# Patient Record
Sex: Male | Born: 1955 | Race: White | Hispanic: No | Marital: Married | Smoking: Former smoker
Health system: Southern US, Community
[De-identification: ages and names within clinical notes are randomized; demographics above are authoritative.]

## PROBLEM LIST (undated history)

## (undated) DIAGNOSIS — N200 Calculus of kidney: Secondary | ICD-10-CM

## (undated) DIAGNOSIS — K219 Gastro-esophageal reflux disease without esophagitis: Secondary | ICD-10-CM

## (undated) DIAGNOSIS — H409 Unspecified glaucoma: Secondary | ICD-10-CM

## (undated) DIAGNOSIS — F32A Depression, unspecified: Secondary | ICD-10-CM

## (undated) DIAGNOSIS — R3915 Urgency of urination: Secondary | ICD-10-CM

## (undated) DIAGNOSIS — N201 Calculus of ureter: Secondary | ICD-10-CM

## (undated) DIAGNOSIS — R300 Dysuria: Secondary | ICD-10-CM

## (undated) DIAGNOSIS — M1990 Unspecified osteoarthritis, unspecified site: Secondary | ICD-10-CM

## (undated) DIAGNOSIS — Z87442 Personal history of urinary calculi: Secondary | ICD-10-CM

## (undated) DIAGNOSIS — Z973 Presence of spectacles and contact lenses: Secondary | ICD-10-CM

## (undated) DIAGNOSIS — I10 Essential (primary) hypertension: Secondary | ICD-10-CM

## (undated) DIAGNOSIS — F329 Major depressive disorder, single episode, unspecified: Secondary | ICD-10-CM

## (undated) HISTORY — DX: Gastro-esophageal reflux disease without esophagitis: K21.9

## (undated) HISTORY — DX: Unspecified glaucoma: H40.9

## (undated) HISTORY — PX: KNEE SURGERY: SHX244

## (undated) HISTORY — PX: NOSE SURGERY: SHX723

## (undated) HISTORY — DX: Essential (primary) hypertension: I10

## (undated) HISTORY — PX: ANKLE FRACTURE SURGERY: SHX122

---

## 1999-12-20 ENCOUNTER — Encounter: Admission: RE | Admit: 1999-12-20 | Discharge: 1999-12-20 | Payer: Self-pay | Admitting: General Surgery

## 1999-12-20 ENCOUNTER — Encounter (HOSPITAL_BASED_OUTPATIENT_CLINIC_OR_DEPARTMENT_OTHER): Payer: Self-pay | Admitting: General Surgery

## 1999-12-21 ENCOUNTER — Ambulatory Visit (HOSPITAL_BASED_OUTPATIENT_CLINIC_OR_DEPARTMENT_OTHER): Admission: RE | Admit: 1999-12-21 | Discharge: 1999-12-21 | Payer: Self-pay | Admitting: General Surgery

## 1999-12-21 ENCOUNTER — Encounter (INDEPENDENT_AMBULATORY_CARE_PROVIDER_SITE_OTHER): Payer: Self-pay

## 1999-12-21 HISTORY — PX: INGUINAL HERNIA REPAIR: SUR1180

## 2008-05-08 ENCOUNTER — Inpatient Hospital Stay (HOSPITAL_COMMUNITY): Admission: EM | Admit: 2008-05-08 | Discharge: 2008-05-11 | Payer: Self-pay | Admitting: Emergency Medicine

## 2008-05-09 HISTORY — PX: IM NAILING TIBIA: SUR734

## 2009-07-11 ENCOUNTER — Ambulatory Visit (HOSPITAL_BASED_OUTPATIENT_CLINIC_OR_DEPARTMENT_OTHER): Admission: RE | Admit: 2009-07-11 | Discharge: 2009-07-11 | Payer: Self-pay | Admitting: Orthopaedic Surgery

## 2009-07-11 HISTORY — PX: HARDWARE REMOVAL: SHX979

## 2010-06-28 LAB — POCT I-STAT, CHEM 8
BUN: 15 mg/dL (ref 6–23)
Calcium, Ion: 1.06 mmol/L — ABNORMAL LOW (ref 1.12–1.32)
Chloride: 110 mEq/L (ref 96–112)
Creatinine, Ser: 0.8 mg/dL (ref 0.4–1.5)
Glucose, Bld: 104 mg/dL — ABNORMAL HIGH (ref 70–99)
HCT: 50 % (ref 39.0–52.0)
Hemoglobin: 17 g/dL (ref 13.0–17.0)
Potassium: 4 mEq/L (ref 3.5–5.1)
Sodium: 139 mEq/L (ref 135–145)
TCO2: 21 mmol/L (ref 0–100)

## 2010-07-24 LAB — DIFFERENTIAL
Basophils Relative: 1 % (ref 0–1)
Eosinophils Absolute: 0.1 10*3/uL (ref 0.0–0.7)
Lymphs Abs: 2.1 10*3/uL (ref 0.7–4.0)
Neutro Abs: 12.1 10*3/uL — ABNORMAL HIGH (ref 1.7–7.7)
Neutrophils Relative %: 79 % — ABNORMAL HIGH (ref 43–77)

## 2010-07-24 LAB — CBC
HCT: 43.9 % (ref 39.0–52.0)
Hemoglobin: 15.2 g/dL (ref 13.0–17.0)
MCHC: 34.6 g/dL (ref 30.0–36.0)
MCV: 90.4 fL (ref 78.0–100.0)
RBC: 4.86 MIL/uL (ref 4.22–5.81)

## 2010-07-24 LAB — PROTIME-INR
INR: 1 (ref 0.00–1.49)
Prothrombin Time: 13.1 seconds (ref 11.6–15.2)

## 2010-07-24 LAB — URINALYSIS, ROUTINE W REFLEX MICROSCOPIC
Bilirubin Urine: NEGATIVE
Hgb urine dipstick: NEGATIVE
Protein, ur: NEGATIVE mg/dL
Urobilinogen, UA: 0.2 mg/dL (ref 0.0–1.0)

## 2010-07-24 LAB — COMPREHENSIVE METABOLIC PANEL
ALT: 50 U/L (ref 0–53)
Alkaline Phosphatase: 58 U/L (ref 39–117)
BUN: 16 mg/dL (ref 6–23)
CO2: 19 mEq/L (ref 19–32)
Calcium: 8.9 mg/dL (ref 8.4–10.5)
GFR calc non Af Amer: >60 mL/min (ref >60)
Glucose, Bld: 134 mg/dL — ABNORMAL HIGH (ref 70–99)
Sodium: 136 mEq/L (ref 135–145)

## 2010-08-22 NOTE — Op Note (Signed)
[name], [name]              ACCOUNT NO.:  [id number]   MEDICAL RECORD NO.:  [id number]          PATIENT TYPE:  INP   LOCATION:  1612                         FACILITY:  [facility]   PHYSICIAN:  Mark C. Ophelia Charter, M.D.    DATE OF BIRTH:  [birth year]   DATE OF PROCEDURE:  05/09/2008  DATE OF DISCHARGE:                               OPERATIVE REPORT   PREOPERATIVE DIAGNOSIS:  Comminuted segmental left tibial shaft fracture  with fibular fracture.   POSTOPERATIVE DIAGNOSIS:  Comminuted segmental left tibial shaft  fracture with fibular fracture.   PROCEDURE:  Left tibial nail with proximal and distal interlock.  Synthes 285-mm nail x 10-mm diameter.   SURGEON:  Mark C. Ophelia Charter, M.D.   ANESTHESIA:  GOT.   TOURNIQUET TIME:  44 minutes.   DRAINS:  None.   This 55 year old male was going outside, slipped on the porch as he was  going to the steps and suffered a comminuted mid distal shaft tibia  fracture with segmental fibular fracture.   A proximal thigh tourniquet was applied, leg was prepared with the  clippers, a 10/15 drape was applied and the foot was prepped with  DuraPrep and after I had gowned and gloved, after appropriate hand  sterilization via standard technique, I held the foot with an impervious  stockinette above the ankle with distraction and the rest of the leg was  prepped from the midcalf up to the level of the tourniquet.  A sterile  blue U-drape was applied, Coban was applied to the foot, leaving the  ankle exposed and extremity sheets and drapes were applied.  A small  triangle was used with careful padding and a surgical time-out checklist  was completed, Ancef had been given, leg was wrapped in Esmarch,  tourniquet inflated.  Tourniquet was inflated to 350, it was deflated at  the end of the case prior to closure.  Incision was made parallel to the  medial aspect of the patellar tendon.  Blunt dissection after incising  superficial retinaculum was performed down  to the bone and a drill was  used, checked under fluoroscopy AP and lateral, then overdrilled with  the reamer to make a cortical window which was extra-articular.  Beaded  tip rod was slightly bent, passed across the fracture site by hand with  traction applied to the leg lining up the segmental oblique segment and  the beaded rod was extended down the old growth plate, it was measured  at 285.  Sequential reaming was performed up to an 11 for a 10-mm  selected nail times 285 Synthes.  Once it was countersunk, the lines  were checked proximally, checked under lateral to make sure it was in  good position, interlocks were placed initial and went from anteromedial  to posterolateral and it was posterior to the fibular head and exactly  through the cortex but not protruding into the soft tissue more than 1-  mm.  Another interlock was placed from medial to lateral, both static,  locked down, checked under fluoroscopy.  The proximal guide device was  removed.  Distal interlocks were placed  by freehand technique, one  medial to lateral, the other anterior to posterior, but I did not use  the distal most hole, which biomechanically is not as strong.  Rotation  was lined up, fracture was checked, it was an excellent position, it was  compressed and screws were tightened down, locked down securely and  freehand technique was used for the fluoroscopy.  Eight-tenths of a  minute was total fluoroscopy spot times in total.  All areas were  irrigated, incisions where the screws were placed, which had been made  with a stab incision, blunt spreading with the mosquito and then placing  the drill with drill guide directly down bone, to make sure that there  was no soft tissue damage these incisions were closed with a skin  stapler.  Superficial retinaculum in the knee, superficial subcutaneous  tissues were closed with 0 Vicryl in the deepest layer, 2-0 Vicryl in  the subcutaneous tissue and then skin  staple closure, postoperative  dressing, short-leg splint after Adaptic, 4 x 4's.  Marcaine was  infiltrated in all areas and the tourniquet was deflated prior to  closure.  There was minimal bleeding and spot pictures were taken  confirming appropriate position and alignment and good position of the  fractures.  It was elected not to plate the fibula since the tibia lined  up well, had good rotation and there was good canal fit since a 10-mm  rod had been able to be placed.  Fibula was out to length.  Surgical  checklist was completed prior to closure at the end of case, Class I  wound.  Instrument count, needle count, sponge count was correct.      Mark C. Ophelia Charter, M.D.  Electronically Signed     MCY/MEDQ  D:  05/09/2008  T:  05/09/2008  Job:  [id number]

## 2010-08-25 NOTE — Op Note (Signed)
[place]  Patient:    [name], [name]                     MRN: [id number] Proc. Date: 12/21/99 Adm. Date:  [id number] Attending:  Fortino Sic CC:         Teena Irani. Arlyce Dice, M.D.   Operative Report  PREOPERATIVE DIAGNOSIS:  Left inguinal hernia.  POSTOPERATIVE DIAGNOSIS:  Left inguinal hernia.  OPERATION PERFORMED:  Repair of left inguinal hernia.  SURGEON:  Marnee Spring. Wiliam Ke, M.D.  ASSISTANT:  None.  ANESTHESIA:  Local MAC.  PROCEDURE:  With the patient well-sedated, the skin of the groin was prepped and draped in usual manner.  Tissues were infiltrated with a mixture of Xylocaine and Marcaine anesthesia.  A curvilinear incision was made in a lower abdominal skin fold and deepened through subcutaneous tissues.  The external oblique aponeurosis was opened, dissecting the inguinal canal.  The ilioinguinal nerve was identified and spared.  Spermatic cord was held aside with a Penrose drain.  The patient had a large direct hernia and a large lipoma of the cord.  The lipoma of the cord was freed from the anteromedial aspect of the cord.  It was excised after being tied off with 2-0 silk.  There was no indirect sac.   The internal ring was made smaller and the hernia was repaired with a Marlex patch.  This was fashioned and then sewn in place with two running 2-0 Novofil sutures.  Both starting at pubic tubercle, one running up conjoin tendon and one running up shelving portion of Pouparts ligament. Both were tied at the internal ring and then a collaring suture of 2-0 Novofil was used.  Hemostasis was excellent.  The external oblique aponeurosis was closed superficial to the cord with a running 2-0 Vicryl, 3-0 Vicryl was used in Scarpas fascia.  The skin was closed with subcuticular 4-0 Dexon and Steri-Strips were applied.  Estimated blood loss minimal.  The patient received no blood and left the operating room in satisfactory condition  after sponge and needle counts were verified and the testicles found to be well in the scrotum. DD:  12/21/99 TD:  12/22/99 Job: [id number]

## 2010-08-25 NOTE — Discharge Summary (Signed)
[name], [name]              ACCOUNT NO.:  [id number]   MEDICAL RECORD NO.:  [id number]          PATIENT TYPE:  INP   LOCATION:  1612                         FACILITY:  [facility]   PHYSICIAN:  Mark C. Ophelia Charter, M.D.    DATE OF BIRTH:  [birth year]   DATE OF ADMISSION:  05/08/2008  DATE OF DISCHARGE:  05/11/2008                               DISCHARGE SUMMARY   ADMISSION DIAGNOSES:  1. Left comminuted distal tibia and fibula fracture.  2. Hypertension  3. History of kidney stones.   DISCHARGE DIAGNOSES:  1. Left comminuted distal tibia and fibula fracture.  2. Hypertension  3. History of kidney stones.  4. Acute blood loss anemia, mild.   PROCEDURE:  On May 09, 2008, the patient underwent intermedullary  nailing of left closed comminuted tibia fracture.  This was performed by  Dr. Ophelia Charter under general anesthesia.   CONSULTATIONS:  None.   BRIEF HISTORY:  The patient is a 55 year old male who tripped and fell  at his home.  He had immediate onset of pain in the left lower extremity  and inability to bear weight.  He was brought to the emergency room for  evaluation.  Radiographs showed a closed comminuted fracture of the left  tibia and fibula.  The patient was admitted for surgical intervention.   BRIEF HOSPITAL COURSE:  The patient tolerated the procedure under  general anesthesia without complications.  Postoperatively,  neurovascular motor function was noted to be intact.  The patient was  monitored closely for compartment syndrome and did have some bloody  drainage from the mid posterior calf with dressing changes made as  needed.  The patient was not placed on Lovenox or Coumadin for DVT  prophylaxis as the patient was at high risk for compartment syndrome  secondary to bleeding in the calf.  DVT prophylaxis included SCDs,  aspirin b.i.d. and early ambulation.  The patient utilized PCA  analgesics initially and was weaned to p.o. analgesics without  difficulty.  He was  able to utilize Tylox with good pain control.  The  patient was seen by the physical therapist for ambulation and gait  training, and was taught strict nonweightbearing to the operative  extremity utilizing a walker.  He was able to ambulate 50 feet during  the hospital stay and did demonstrate ability to maintain the  nonweightbearing status.  On May 11, 2008, he was stable for  discharge to his home.   LABORATORY DATA:  Pertinent laboratory values on admission:  CBC with  hemoglobin 15.2, hematocrit 43.9 and WBC 15.3.  At discharge, hemoglobin  and hematocrit 12.5 and 36.3.  On admission, coagulation studies within  normal limits.  CMET on admission was within normal limits with the  exception of glucose 134.  Urinalysis on admission negative for urinary  tract infection.   DIAGNOSTICS:  1. EKG on admission, normal EKG.  2. Chest x-ray on admission with no acute abnormalities.   PLAN:  The patient was discharged to his home.  Arrangements made for  home health physical therapy evaluation.  He will continue to be strict  nonweightbearing  to the operative extremity and utilize a walker for  ambulation.  He is instructed in elevation of his legs when at rest.  He  will keep his splint and dressing dry and clean.  The patient will  follow up with Dr. Ophelia Charter in 1 week.  He is advised to call the office to  arrange the appointment time.  All questions encouraged and answered.   DISCHARGE MEDICATIONS:  1. Tylox 1-2 every 4-6 hours as needed for pain.  2. Aspirin 325 mg daily.  3. He will continue home medications as taken prior to admission.   CONDITION ON DISCHARGE:  Stable.      Wende Neighbors, P.A.      Mark C. Ophelia Charter, M.D.  Electronically Signed    SMV/MEDQ  D:  06/10/2008  T:  06/10/2008  Job:  [id number]

## 2011-07-13 DIAGNOSIS — I10 Essential (primary) hypertension: Secondary | ICD-10-CM | POA: Insufficient documentation

## 2017-04-23 ENCOUNTER — Ambulatory Visit (INDEPENDENT_AMBULATORY_CARE_PROVIDER_SITE_OTHER): Payer: BLUE CROSS/BLUE SHIELD | Admitting: Orthopaedic Surgery

## 2017-04-23 ENCOUNTER — Encounter (INDEPENDENT_AMBULATORY_CARE_PROVIDER_SITE_OTHER): Payer: Self-pay | Admitting: Orthopaedic Surgery

## 2017-04-23 ENCOUNTER — Ambulatory Visit (INDEPENDENT_AMBULATORY_CARE_PROVIDER_SITE_OTHER): Payer: BLUE CROSS/BLUE SHIELD

## 2017-04-23 VITALS — BP 153/94 | HR 73 | Ht 65.0 in | Wt 215.0 lb

## 2017-04-23 DIAGNOSIS — M1711 Unilateral primary osteoarthritis, right knee: Secondary | ICD-10-CM

## 2017-04-23 DIAGNOSIS — M25561 Pain in right knee: Secondary | ICD-10-CM | POA: Diagnosis not present

## 2017-04-23 DIAGNOSIS — G8929 Other chronic pain: Secondary | ICD-10-CM | POA: Diagnosis not present

## 2017-04-23 NOTE — Progress Notes (Signed)
Office Visit Note   Patient: [name]           Date of Birth: [birth year]           MRN: [id number] Visit Date: 04/23/2017              Requested by: No referring provider defined for this encounter. PCP: Chesley Noon, MD   Assessment & Plan: Visit Diagnoses:  1. Chronic pain of right knee   2. Unilateral primary osteoarthritis, right knee     Plan: Injection performed with improved symptoms.  I plan to recheck him in 2 months.  We reviewed the findings on his x-ray and he understands that at some point he will likely require total knee arthroplasty.  Follow-Up Instructions: Return in about 2 months (around 06/21/2017).   Orders:  Orders Placed This Encounter  Procedures  . Large Joint Inj: R knee  . XR KNEE 3 VIEW RIGHT   No orders of the defined types were placed in this encounter.     Procedures: Large Joint Inj: R knee on 04/23/2017 4:35 PM Indications: pain and joint swelling Details: 22 G 1.5 in needle, anterolateral approach  Arthrogram: No  Medications: 40 mg methylPREDNISolone acetate 40 MG/ML; 0.5 mL lidocaine 1 %; 4 mL bupivacaine 0.25 % Outcome: tolerated well, no immediate complications Procedure, treatment alternatives, risks and benefits explained, specific risks discussed. Consent was given by the patient. Immediately prior to procedure a time out was called to verify the correct patient, procedure, equipment, support staff and site/side marked as required. Patient was prepped and draped in the usual sterile fashion.       Clinical Data: No additional findings.   Subjective: Chief Complaint  Patient presents with  . Right Knee - Pain    HPI  62 year old male seen with chronic right knee pain which is gradually getting worse with difficulty walking.  He has popping giving way has not fallen yet but several times had to grab something to avoid falling.  He has swelling by the end of the day.  Previous knee arthroscopy with probable  medial meniscectomy in 1971 and 72.  Denies hip pain no chills or fever.  Review of Systems positive for hypertension previous knee arthroscopy 1971.  Previous left tibial nail 2010 for tibial fracture.  Otherwise negative as it pertains HPI.   Objective: Vital Signs: BP (!) 153/94   Pulse 73   Ht 5\' 5"  (1.651 m)   Wt 215 lb (97.5 kg)   BMI 35.78 kg/m   Physical Exam  Constitutional: He is oriented to person, place, and time. He appears well-developed and well-nourished.  HENT:  Head: Normocephalic and atraumatic.  Eyes: EOM are normal. Pupils are equal, round, and reactive to light.  Neck: No tracheal deviation present. No thyromegaly present.  Cardiovascular: Normal rate.  Pulmonary/Chest: Effort normal. He has no wheezes.  Abdominal: Soft. Bowel sounds are normal.  Neurological: He is alert and oriented to person, place, and time.  Skin: Skin is warm and dry. Capillary refill takes less than 2 seconds.  Psychiatric: He has a normal mood and affect. His behavior is normal. Judgment and thought content normal.    Ortho Exam patient has some varus deformity of the right knee no pain with hip range of motion no limitation of hip flexion contracture.  3+ knee effusion medial compartment tenderness.  Well-healed scars on the left knee from previous tibial nail.  Is amatory with the right knee limp.  Specialty Comments:  No specialty comments available.  Imaging: No results found.   PMFS History: There are no active problems to display for this patient.  Past Medical History:  Diagnosis Date  . Acid reflux   . Glaucoma   . Hypertension     No family history on file.  Past Surgical History:  Procedure Laterality Date  . ANKLE FRACTURE SURGERY    . KNEE SURGERY     Social History   Occupational History  . Not on file  Tobacco Use  . Smoking status: Never Smoker  . Smokeless tobacco: Never Used  Substance and Sexual Activity  . Alcohol use: No    Frequency: Never    . Drug use: No  . Sexual activity: Not on file

## 2017-04-26 ENCOUNTER — Encounter (INDEPENDENT_AMBULATORY_CARE_PROVIDER_SITE_OTHER): Payer: Self-pay | Admitting: Orthopaedic Surgery

## 2017-04-26 MED ORDER — LIDOCAINE HCL 1 % IJ SOLN
0.5000 mL | INTRAMUSCULAR | Status: AC | PRN
  Administered 2017-04-23: .5 mL

## 2017-04-26 MED ORDER — BUPIVACAINE HCL 0.25 % IJ SOLN
4.0000 mL | INTRAMUSCULAR | Status: AC | PRN
  Administered 2017-04-23: 4 mL via INTRA_ARTICULAR

## 2017-04-26 MED ORDER — METHYLPREDNISOLONE ACETATE 40 MG/ML IJ SUSP
40.0000 mg | INTRAMUSCULAR | Status: AC | PRN
  Administered 2017-04-23: 40 mg via INTRA_ARTICULAR

## 2017-06-21 ENCOUNTER — Ambulatory Visit (INDEPENDENT_AMBULATORY_CARE_PROVIDER_SITE_OTHER): Payer: BLUE CROSS/BLUE SHIELD | Admitting: Orthopaedic Surgery

## 2017-06-21 ENCOUNTER — Encounter (INDEPENDENT_AMBULATORY_CARE_PROVIDER_SITE_OTHER): Payer: Self-pay | Admitting: Orthopaedic Surgery

## 2017-06-21 VITALS — BP 152/96 | HR 77 | Ht 65.0 in | Wt 213.0 lb

## 2017-06-21 DIAGNOSIS — M1711 Unilateral primary osteoarthritis, right knee: Secondary | ICD-10-CM | POA: Diagnosis not present

## 2017-06-21 NOTE — Progress Notes (Signed)
Office Visit Note   Patient: [name]           Date of Birth: 1955-06-11           MRN: [id number] Visit Date: 06/21/2017              Requested by: Chesley Noon, MD [address], [place] [address] PCP: Chesley Noon, MD   Assessment & Plan: Visit Diagnoses:  1. Unilateral primary osteoarthritis, right knee     Plan: Patient's had anti-inflammatories intra-articular injections with persistent pain and erosive wear with progressive varus deformity tricompartmental degenerative changes and medial subluxation of femur on tibia.  Discussion about total knee arthroplasty was performed.  We discussed operative technique risks of surgery, home health physical therapy.  He has a wife is had 5 knee operations had MRSA infection afterwards.  He has a neighbor also had MRSA infection after total knee arthroplasty.  We discussed PCR, appropriate antibiotic coverage and infection prevention techniques.  Questions were elicited and answered.  He had an injection in January and could schedule anytime after 07/22/2017.  He states he prefer Friday morning surgery if possible.  Patient understands request to proceed and will call about scheduling.  Follow-Up Instructions: No Follow-up on file.   Orders:  No orders of the defined types were placed in this encounter.  No orders of the defined types were placed in this encounter.     Procedures: No procedures performed   Clinical Data: No additional findings.   Subjective: Chief Complaint  Patient presents with  . Right Knee - Pain, Follow-up    HPI 62 year old male returns with severe right knee osteoarthritis.  Patient had previous injection done exactly 2 months ago and got some relief for period of time.  He has pain with walking pain that bothers him at night.  He still reaches full extension and he can bend to 115 degrees.  He is taken anti-inflammatories in the past.  Previous left comminuted tibial fracture  fixed with intramedullary nail in 2010 with satisfactory healing.  Patient's right knee pain bothers him with community ambulation.  Patient is on Voltaren 75 mg p.o. twice daily currently.  He has been on other anti-inflammatories in the past.  Review of Systems previous comminuted left tib-fib fracture treated with intramedullary nail with satisfactory healing 2010.  Positive for hypertension.  Negative for CVA, stroke, endocrine respiratory.  14 point review of systems updated.   Objective: Vital Signs: BP (!) 152/96   Pulse 77   Ht 5\' 5"  (1.651 m)   Wt 213 lb (96.6 kg)   BMI 35.45 kg/m   Physical Exam  Constitutional: He is oriented to person, place, and time. He appears well-developed and well-nourished.  HENT:  Head: Normocephalic and atraumatic.  Eyes: EOM are normal. Pupils are equal, round, and reactive to light.  Neck: No tracheal deviation present. No thyromegaly present.  Cardiovascular: Normal rate.  Pulmonary/Chest: Effort normal. He has no wheezes.  Abdominal: Soft. Bowel sounds are normal.  Neurological: He is alert and oriented to person, place, and time.  Skin: Skin is warm and dry. Capillary refill takes less than 2 seconds.  Psychiatric: He has a normal mood and affect. His behavior is normal. Judgment and thought content normal.    Ortho Exam patient has normal hip range of motion negative straight leg raising at 90 degrees.  There is varus deformity of the right knee he does reach full extension and can flex  to 110 degrees.  Laxity of medial collateral ligament due to the erosive medial compartment wear.  ACL exam is normal.  There is 2+ knee effusion.  Distal pulses are intact.  No venous stasis changes negative Homan.  Ankle range of motion is normal. Specialty Comments:  No specialty comments available.  Imaging: No results found.   PMFS History: There are no active problems to display for this patient.  Past Medical History:  Diagnosis Date  . Acid  reflux   . Glaucoma   . Hypertension     No family history on file.  Past Surgical History:  Procedure Laterality Date  . ANKLE FRACTURE SURGERY    . KNEE SURGERY     Social History   Occupational History  . Not on file  Tobacco Use  . Smoking status: Never Smoker  . Smokeless tobacco: Never Used  Substance and Sexual Activity  . Alcohol use: No    Frequency: Never  . Drug use: No  . Sexual activity: Not on file

## 2017-06-26 ENCOUNTER — Other Ambulatory Visit (INDEPENDENT_AMBULATORY_CARE_PROVIDER_SITE_OTHER): Payer: Self-pay

## 2017-07-02 NOTE — Pre-Procedure Instructions (Signed)
DONZEL ROMACK  07/02/2017      [id number] [place], [place] - [address] [place] [address] Phone: [phone] Fax: [phone]  [place], [place] - [place] [address] Phone: [phone] Fax: [phone]    Your procedure is scheduled on April 5  Report to [place] at 530 A.M.  Call this number if you have problems the morning of surgery:  [phone]   Remember:  Do not eat food or drink liquids after midnight.  Take these medicines the morning of surgery with A SIP OF WATER loratadine (Claritin), Metoprolol Succinate (Toprol-xl), Omeprazole (Prilosec)  Stop  Taking Diclofenac (Voltaren), Aspirin, BC's, Goody's, Herbal medications, Fish Oil, Vitamins, Aleve, Ibuprofen, Advil, Motrin- 7 days prior to your surgery   Do not wear jewelry, make-up or nail polish.  Do not wear lotions, powders, or perfumes, or deodorant.  Do not shave 48 hours prior to surgery.  Men may shave face and neck.  Do not bring valuables to the hospital.  [facility] is not responsible for any belongings or valuables.  Contacts, dentures or bridgework may not be worn into surgery.  Leave your suitcase in the car.  After surgery it may be brought to your room.  For patients admitted to the hospital, discharge time will be determined by your treatment team.  Patients discharged the day of surgery will not be allowed to drive home.    Special instructions:   [place]- Preparing For Surgery  Before surgery, you can play an important role. Because skin is not sterile, your skin needs to be as free of germs as possible. You can reduce the number of germs on your skin by washing with CHG (chlorahexidine gluconate) Soap before surgery.  CHG is an antiseptic cleaner which kills germs and bonds with the skin to continue killing  germs even after washing.  Please do not use if you have an allergy to CHG or antibacterial soaps. If your skin becomes reddened/irritated stop using the CHG.  Do not shave (including legs and underarms) for at least 48 hours prior to first CHG shower. It is OK to shave your face.  Please follow these instructions carefully.   1. Shower the NIGHT BEFORE SURGERY and the MORNING OF SURGERY with CHG.   2. If you chose to wash your hair, wash your hair first as usual with your normal shampoo.  3. After you shampoo, rinse your hair and body thoroughly to remove the shampoo.  4. Use CHG as you would any other liquid soap. You can apply CHG directly to the skin and wash gently with a scrungie or a clean washcloth.   5. Apply the CHG Soap to your body ONLY FROM THE NECK DOWN.  Do not use on open wounds or open sores. Avoid contact with your eyes, ears, mouth and genitals (private parts). Wash Face and genitals (private parts)  with your normal soap.  6. Wash thoroughly, paying special attention to the area where your surgery will be performed.  7. Thoroughly rinse your body with warm water from the neck down.  8. DO NOT shower/wash with your normal soap after using and rinsing off the CHG Soap.  9. Pat yourself dry with a CLEAN TOWEL.  10. Wear CLEAN PAJAMAS to bed the night before surgery, wear comfortable clothes the morning  of surgery  11. Place CLEAN SHEETS on your bed the night of your first shower and DO NOT SLEEP WITH PETS.    Day of Surgery: Do not apply any deodorants/lotions. Please wear clean clothes to the hospital/surgery center.      Please read over the following fact sheets that you were given. Pain Booklet, Coughing and Deep Breathing, MRSA Information and Surgical Site Infection Prevention

## 2017-07-03 ENCOUNTER — Other Ambulatory Visit: Payer: Self-pay

## 2017-07-03 ENCOUNTER — Ambulatory Visit (INDEPENDENT_AMBULATORY_CARE_PROVIDER_SITE_OTHER): Payer: BLUE CROSS/BLUE SHIELD | Admitting: Surgery

## 2017-07-03 ENCOUNTER — Encounter (HOSPITAL_COMMUNITY)
Admission: RE | Admit: 2017-07-03 | Discharge: 2017-07-03 | Disposition: A | Discharge status: Home / Self Care | Payer: BLUE CROSS/BLUE SHIELD | Source: Ambulatory Visit | Attending: Orthopaedic Surgery | Admitting: Orthopaedic Surgery

## 2017-07-03 ENCOUNTER — Encounter (INDEPENDENT_AMBULATORY_CARE_PROVIDER_SITE_OTHER): Payer: Self-pay | Admitting: Surgery

## 2017-07-03 ENCOUNTER — Encounter (HOSPITAL_COMMUNITY): Payer: Self-pay

## 2017-07-03 ENCOUNTER — Ambulatory Visit (HOSPITAL_COMMUNITY)
Admission: RE | Admit: 2017-07-03 | Discharge: 2017-07-03 | Disposition: A | Discharge status: Home / Self Care | Payer: BLUE CROSS/BLUE SHIELD | Source: Ambulatory Visit | Attending: Surgery | Admitting: Surgery

## 2017-07-03 VITALS — BP 164/88 | HR 78 | Temp 96.4°F | Ht 65.0 in | Wt 213.0 lb

## 2017-07-03 DIAGNOSIS — Z01818 Encounter for other preprocedural examination: Secondary | ICD-10-CM

## 2017-07-03 DIAGNOSIS — M1711 Unilateral primary osteoarthritis, right knee: Secondary | ICD-10-CM | POA: Diagnosis not present

## 2017-07-03 HISTORY — DX: Unspecified osteoarthritis, unspecified site: M19.90

## 2017-07-03 HISTORY — DX: Depression, unspecified: F32.A

## 2017-07-03 HISTORY — DX: Major depressive disorder, single episode, unspecified: F32.9

## 2017-07-03 LAB — COMPREHENSIVE METABOLIC PANEL
ALT: 59 U/L (ref 17–63)
AST: 40 U/L (ref 15–41)
Albumin: 4.1 g/dL (ref 3.5–5.0)
Alkaline Phosphatase: 53 U/L (ref 38–126)
Anion gap: 10 (ref 5–15)
BILIRUBIN TOTAL: 0.6 mg/dL (ref 0.3–1.2)
BUN: 11 mg/dL (ref 6–20)
CO2: 19 mmol/L — ABNORMAL LOW (ref 22–32)
Calcium: 9.1 mg/dL (ref 8.9–10.3)
Chloride: 106 mmol/L (ref 101–111)
Creatinine, Ser: 0.8 mg/dL (ref 0.61–1.24)
Glucose, Bld: 106 mg/dL — ABNORMAL HIGH (ref 65–99)
POTASSIUM: 3.8 mmol/L (ref 3.5–5.1)
Sodium: 135 mmol/L (ref 135–145)
TOTAL PROTEIN: 7.4 g/dL (ref 6.5–8.1)

## 2017-07-03 LAB — CBC
HEMATOCRIT: 43.9 % (ref 39.0–52.0)
Hemoglobin: 15.3 g/dL (ref 13.0–17.0)
MCH: 31.7 pg (ref 26.0–34.0)
MCHC: 34.9 g/dL (ref 30.0–36.0)
MCV: 90.9 fL (ref 78.0–100.0)
PLATELETS: 273 10*3/uL (ref 150–400)
RBC: 4.83 MIL/uL (ref 4.22–5.81)
RDW: 13.2 % (ref 11.5–15.5)
WBC: 8.8 10*3/uL (ref 4.0–10.5)

## 2017-07-03 LAB — URINALYSIS, ROUTINE W REFLEX MICROSCOPIC
BILIRUBIN URINE: NEGATIVE
Bacteria, UA: NONE SEEN
GLUCOSE, UA: NEGATIVE mg/dL
HGB URINE DIPSTICK: NEGATIVE
Ketones, ur: NEGATIVE mg/dL
NITRITE: NEGATIVE
Protein, ur: NEGATIVE mg/dL
SPECIFIC GRAVITY, URINE: 1.011 (ref 1.005–1.030)
Squamous Epithelial / LPF: NONE SEEN
pH: 7 (ref 5.0–8.0)

## 2017-07-03 LAB — SURGICAL PCR SCREEN
MRSA, PCR: NEGATIVE
Staphylococcus aureus: POSITIVE — AB

## 2017-07-03 NOTE — Progress Notes (Signed)
Patient comes in today for preop H&P for right total knee replacement.  Full H&P was placed in the chart.

## 2017-07-03 NOTE — Progress Notes (Signed)
PCp is Dr. Anastasia Pall Denies seeing a cardiologist. Denies chest pain, or fever, reports a cough at times related to allergies Denies ever having a card cath, stress test, or echo.

## 2017-07-03 NOTE — Progress Notes (Signed)
[name] called and informed of PCR results, and the need to pick [facility] the [facility]. [facility]

## 2017-07-03 NOTE — Progress Notes (Signed)
   07/03/17 1204  [place]  Have you ever been diagnosed with sleep apnea through a sleep study? No  Do you snore loudly (loud enough to be heard through closed doors)?  1  Do you often feel tired, fatigued, or sleepy during the daytime (such as falling asleep during driving or talking to someone)? 0  Has anyone observed you stop breathing during your sleep? 0  Do you have, or are you being treated for high blood pressure? 1  BMI more than 35 kg/m2? 1  Age > 50 (1-yes) 1  Neck circumference greater than:Male 16 inches or larger, Male 17inches or larger? 1  Male Gender (Yes=1) 1  Obstructive Sleep Apnea Score 6  Score 5 or greater  Results sent to PCP

## 2017-07-03 NOTE — H&P (Signed)
TOTAL KNEE ADMISSION H&P  Patient is being admitted for right total knee arthroplasty.  Subjective:  Chief Complaint:right knee pain.  HPI: [name], 62 y.o. male, has a history of pain and functional disability in the right knee due to arthritis and has failed non-surgical conservative treatments for greater than 12 weeks to includeNSAID's and/or analgesics, use of assistive devices, weight reduction as appropriate and activity modification.  Onset of symptoms was gradual, starting 10 years ago with gradually worsening course since that time.  Patient currently rates pain in the right knee(s) at 10 out of 10 with activity. Patient has night pain, worsening of pain with activity and weight bearing, pain that interferes with activities of daily living, pain with passive range of motion, crepitus and joint swelling.  Patient has evidence of subchondral sclerosis, periarticular osteophytes and joint space narrowing by imaging studies.  There is no active infection.  Patient Active Problem List   Diagnosis Date Noted  . Unilateral primary osteoarthritis, right knee 06/21/2017   Past Medical History:  Diagnosis Date  . Acid reflux   . Glaucoma   . Hypertension     Past Surgical History:  Procedure Laterality Date  . ANKLE FRACTURE SURGERY    . KNEE SURGERY      No current facility-administered medications for this encounter.    Current Outpatient Medications  Medication Sig Dispense Refill Last Dose  . diclofenac (VOLTAREN) 75 MG EC tablet Take 75 mg by mouth 2 (two) times daily.  1 Taking  . loratadine (CLARITIN) 10 MG tablet Take 10 mg by mouth daily.     . metoprolol succinate (TOPROL-XL) 50 MG 24 hr tablet TAKE 1 TABLET BY MOUTH EVERY DAY   Taking  . Multiple Vitamins-Minerals (MULTIVITAMIN WITH MINERALS) tablet Take 1 tablet by mouth daily.     . Omega-3 Fatty Acids (FISH OIL) 1000 MG CAPS Take 1 capsule by mouth daily.     Marland Kitchen omeprazole (PRILOSEC) 20 MG capsule Take 20 mg by  mouth daily as needed.     . travoprost, benzalkonium, (TRAVATAN) 0.004 % ophthalmic solution Place 1 drop into both eyes at bedtime.      No Known Allergies  Social History   Tobacco Use  . Smoking status: Never Smoker  . Smokeless tobacco: Never Used  Substance Use Topics  . Alcohol use: No    Frequency: Never    No family history on file.   Review of Systems  Constitutional: Negative.   HENT: Negative.   Eyes: Negative.   Respiratory: Negative.   Cardiovascular: Negative.   Gastrointestinal: Negative.   Genitourinary: Negative.   Musculoskeletal: Positive for joint pain.  Skin: Negative.   Neurological: Negative.   Psychiatric/Behavioral: Negative.   Patient admits to snoring.  Undiagnosed sleep apnea  Objective:  Physical Exam  Constitutional: He is oriented to person, place, and time. He appears well-developed. No distress.  HENT:  Head: Normocephalic.  Neck: Normal range of motion.  Cardiovascular: Normal rate and normal heart sounds.  Respiratory: Effort normal. No respiratory distress.  GI: Bowel sounds are normal. He exhibits no distension. There is no tenderness.  Musculoskeletal: He exhibits tenderness.  Neurological: He is alert and oriented to person, place, and time.  Skin: Skin is warm and dry.  Psychiatric: He has a normal mood and affect.    Vital signs in last 24 hours: Temp:  [96.4 F (35.8 C)] 96.4 F (35.8 C) (03/27 1000) Pulse Rate:  [78] 78 (03/27 1000) BP: (164)/(88)  164/88 (03/27 1000) Weight:  [213 lb (96.6 kg)] 213 lb (96.6 kg) (03/27 1000)  Labs:   Estimated body mass index is 35.45 kg/m as calculated from the following:   Height as of 07/03/17: 5\' 5"  (1.651 m).   Weight as of 07/03/17: 213 lb (96.6 kg).   Imaging Review Plain radiographs demonstrate moderate degenerative joint disease of the right knee(s). The overall alignment ismild varus. The bone quality appears to be good for age and reported activity  level.   Preoperative templating of the joint replacement has been completed, documented, and submitted to the Operating Room personnel in order to optimize intra-operative equipment management.       Assessment/Plan:  End stage arthritis, right knee   The patient history, physical examination, clinical judgment of the provider and imaging studies are consistent with end stage degenerative joint disease of the right knee(s) and total knee arthroplasty is deemed medically necessary. The treatment options including medical management, injection therapy arthroscopy and arthroplasty were discussed at length. The risks and benefits of total knee arthroplasty were presented and reviewed. The risks due to aseptic loosening, infection, stiffness, patella tracking problems, thromboembolic complications and other imponderables were discussed. The patient acknowledged the explanation, agreed to proceed with the plan and consent was signed. Patient is being admitted for inpatient treatment for surgery, pain control, PT, OT, prophylactic antibiotics, VTE prophylaxis, progressive ambulation and ADL's and discharge planning. The patient is planning to be discharged home with home health services

## 2017-07-11 NOTE — Anesthesia Preprocedure Evaluation (Addendum)
Anesthesia Evaluation  Patient identified by MRN, date of birth, ID band Patient awake    Reviewed: Allergy & Precautions, NPO status , Patient's Chart, lab work & pertinent test results, reviewed documented beta blocker date and time   Airway Mallampati: IV  TM Distance: >3 FB Neck ROM: Full    Dental  (+) Dental Advisory Given   Pulmonary former smoker,  Possible OSA undiagnosed   Pulmonary exam normal breath sounds clear to auscultation       Cardiovascular hypertension, Pt. on home beta blockers and Pt. on medications Normal cardiovascular exam Rhythm:Regular Rate:Normal     Neuro/Psych Depression negative neurological ROS     GI/Hepatic Neg liver ROS, GERD  Medicated and Controlled,  Endo/Other  Obesity  Renal/GU negative Renal ROS  negative genitourinary   Musculoskeletal  (+) Arthritis ,   Abdominal (+) + obese,   Peds  Hematology negative hematology ROS (+)   Anesthesia Other Findings Glaucoma  Reproductive/Obstetrics                            Anesthesia Physical Anesthesia Plan  ASA: II  Anesthesia Plan: Spinal   Post-op Pain Management:  Regional for Post-op pain   Induction:   PONV Risk Score and Plan: Treatment may vary due to age or medical condition and Propofol infusion  Airway Management Planned: Natural Airway and Simple Face Mask  Additional Equipment: None  Intra-op Plan:   Post-operative Plan:   Informed Consent: I have reviewed the patients History and Physical, chart, labs and discussed the procedure including the risks, benefits and alternatives for the proposed anesthesia with the patient or authorized representative who has indicated his/her understanding and acceptance.     Plan Discussed with: CRNA and Anesthesiologist  Anesthesia Plan Comments:        Anesthesia Quick Evaluation

## 2017-07-12 ENCOUNTER — Inpatient Hospital Stay (HOSPITAL_COMMUNITY): Payer: BLUE CROSS/BLUE SHIELD

## 2017-07-12 ENCOUNTER — Inpatient Hospital Stay (HOSPITAL_COMMUNITY)
Admission: RE | Admit: 2017-07-12 | Discharge: 2017-07-14 | DRG: 470 | Disposition: A | Discharge status: Home Health | Payer: BLUE CROSS/BLUE SHIELD | Source: Ambulatory Visit | Attending: Orthopaedic Surgery | Admitting: Orthopaedic Surgery

## 2017-07-12 ENCOUNTER — Inpatient Hospital Stay (HOSPITAL_COMMUNITY): Payer: BLUE CROSS/BLUE SHIELD | Admitting: Emergency Medicine

## 2017-07-12 ENCOUNTER — Inpatient Hospital Stay (HOSPITAL_COMMUNITY): Payer: BLUE CROSS/BLUE SHIELD | Admitting: Anesthesiology

## 2017-07-12 ENCOUNTER — Other Ambulatory Visit: Payer: Self-pay

## 2017-07-12 ENCOUNTER — Encounter (HOSPITAL_COMMUNITY)
Admission: RE | Disposition: A | Discharge status: Home Health | Payer: Self-pay | Source: Ambulatory Visit | Attending: Orthopaedic Surgery

## 2017-07-12 ENCOUNTER — Encounter (HOSPITAL_COMMUNITY): Payer: Self-pay | Admitting: Certified Registered"

## 2017-07-12 DIAGNOSIS — I10 Essential (primary) hypertension: Secondary | ICD-10-CM | POA: Diagnosis present

## 2017-07-12 DIAGNOSIS — E669 Obesity, unspecified: Secondary | ICD-10-CM | POA: Diagnosis present

## 2017-07-12 DIAGNOSIS — Z6836 Body mass index (BMI) 36.0-36.9, adult: Secondary | ICD-10-CM | POA: Diagnosis not present

## 2017-07-12 DIAGNOSIS — H409 Unspecified glaucoma: Secondary | ICD-10-CM | POA: Diagnosis present

## 2017-07-12 DIAGNOSIS — Z79899 Other long term (current) drug therapy: Secondary | ICD-10-CM | POA: Diagnosis not present

## 2017-07-12 DIAGNOSIS — K219 Gastro-esophageal reflux disease without esophagitis: Secondary | ICD-10-CM | POA: Diagnosis present

## 2017-07-12 DIAGNOSIS — M1711 Unilateral primary osteoarthritis, right knee: Secondary | ICD-10-CM | POA: Diagnosis not present

## 2017-07-12 DIAGNOSIS — Z09 Encounter for follow-up examination after completed treatment for conditions other than malignant neoplasm: Secondary | ICD-10-CM

## 2017-07-12 DIAGNOSIS — M25561 Pain in right knee: Secondary | ICD-10-CM | POA: Diagnosis present

## 2017-07-12 HISTORY — PX: TOTAL KNEE ARTHROPLASTY: SHX125

## 2017-07-12 SURGERY — ARTHROPLASTY, KNEE, TOTAL
Anesthesia: Regional | Site: Knee | Laterality: Right

## 2017-07-12 MED ORDER — OXYCODONE HCL 5 MG/5ML PO SOLN: 5.0000 mg | Freq: Once | ORAL | Status: DC | PRN

## 2017-07-12 MED ORDER — LACTATED RINGERS IV SOLN
INTRAVENOUS | Status: DC | PRN
  Administered 2017-07-12 (×2): via INTRAVENOUS

## 2017-07-12 MED ORDER — MIDAZOLAM HCL 5 MG/5ML IJ SOLN
INTRAMUSCULAR | Status: DC | PRN
  Administered 2017-07-12 (×2): 1 mg via INTRAVENOUS

## 2017-07-12 MED ORDER — METOCLOPRAMIDE HCL 5 MG PO TABS: 5.0000 mg | ORAL_TABLET | Freq: Three times a day (TID) | ORAL | Status: DC | PRN

## 2017-07-12 MED ORDER — BUPIVACAINE-EPINEPHRINE (PF) 0.5% -1:200000 IJ SOLN
INTRAMUSCULAR | Status: DC | PRN
  Administered 2017-07-12: 30 mL via PERINEURAL

## 2017-07-12 MED ORDER — LIDOCAINE HCL (CARDIAC) 20 MG/ML IV SOLN
INTRAVENOUS | Status: AC
  Filled 2017-07-12: qty 5

## 2017-07-12 MED ORDER — ONDANSETRON HCL 4 MG/2ML IJ SOLN: 4.0000 mg | Freq: Four times a day (QID) | INTRAMUSCULAR | Status: DC | PRN

## 2017-07-12 MED ORDER — MIDAZOLAM HCL 2 MG/2ML IJ SOLN
INTRAMUSCULAR | Status: AC
  Filled 2017-07-12: qty 2

## 2017-07-12 MED ORDER — OXYCODONE HCL 5 MG PO TABS
5.0000 mg | ORAL_TABLET | ORAL | Status: DC | PRN
  Administered 2017-07-12 – 2017-07-13 (×4): 10 mg via ORAL
  Filled 2017-07-12 (×3): qty 2

## 2017-07-12 MED ORDER — PROPOFOL 10 MG/ML IV BOLUS
INTRAVENOUS | Status: DC | PRN
  Administered 2017-07-12 (×2): 20 mg via INTRAVENOUS
  Administered 2017-07-12: 30 mg via INTRAVENOUS

## 2017-07-12 MED ORDER — HYDROMORPHONE HCL 1 MG/ML IJ SOLN
0.5000 mg | INTRAMUSCULAR | Status: DC | PRN
  Administered 2017-07-12 – 2017-07-14 (×3): 1 mg via INTRAVENOUS
  Filled 2017-07-12 (×4): qty 1

## 2017-07-12 MED ORDER — CEFAZOLIN SODIUM-DEXTROSE 2-4 GM/100ML-% IV SOLN
2.0000 g | INTRAVENOUS | Status: AC
  Administered 2017-07-12: 2 g via INTRAVENOUS
  Filled 2017-07-12: qty 100

## 2017-07-12 MED ORDER — LIDOCAINE 2% (20 MG/ML) 5 ML SYRINGE
INTRAMUSCULAR | Status: DC | PRN
  Administered 2017-07-12: 30 mg via INTRAVENOUS

## 2017-07-12 MED ORDER — TRAVOPROST (BAK FREE) 0.004 % OP SOLN
1.0000 [drp] | Freq: Every day | OPHTHALMIC | Status: DC
  Administered 2017-07-12 – 2017-07-13 (×2): 1 [drp] via OPHTHALMIC
  Filled 2017-07-12: qty 2.5

## 2017-07-12 MED ORDER — BUPIVACAINE IN DEXTROSE 0.75-8.25 % IT SOLN
INTRATHECAL | Status: DC | PRN
  Administered 2017-07-12: 2 mg via INTRATHECAL

## 2017-07-12 MED ORDER — PHENYLEPHRINE HCL 10 MG/ML IJ SOLN
INTRAVENOUS | Status: DC | PRN
  Administered 2017-07-12: 40 ug/min via INTRAVENOUS

## 2017-07-12 MED ORDER — FENTANYL CITRATE (PF) 100 MCG/2ML IJ SOLN
INTRAMUSCULAR | Status: AC
  Administered 2017-07-12: 50 ug via INTRAVENOUS
  Filled 2017-07-12: qty 2

## 2017-07-12 MED ORDER — ONDANSETRON HCL 4 MG/2ML IJ SOLN
INTRAMUSCULAR | Status: DC | PRN
  Administered 2017-07-12: 4 mg via INTRAVENOUS

## 2017-07-12 MED ORDER — PROPOFOL 10 MG/ML IV BOLUS
INTRAVENOUS | Status: AC
  Filled 2017-07-12: qty 20

## 2017-07-12 MED ORDER — ONDANSETRON HCL 4 MG/2ML IJ SOLN
INTRAMUSCULAR | Status: AC
  Filled 2017-07-12: qty 2

## 2017-07-12 MED ORDER — FENTANYL CITRATE (PF) 100 MCG/2ML IJ SOLN
25.0000 ug | INTRAMUSCULAR | Status: DC | PRN
  Administered 2017-07-12 (×2): 50 ug via INTRAVENOUS

## 2017-07-12 MED ORDER — CEFAZOLIN SODIUM-DEXTROSE 1-4 GM/50ML-% IV SOLN
1.0000 g | Freq: Three times a day (TID) | INTRAVENOUS | Status: AC
  Administered 2017-07-12 (×2): 1 g via INTRAVENOUS
  Filled 2017-07-12 (×2): qty 50

## 2017-07-12 MED ORDER — TRANEXAMIC ACID 1000 MG/10ML IV SOLN
1000.0000 mg | INTRAVENOUS | Status: DC
  Filled 2017-07-12: qty 10

## 2017-07-12 MED ORDER — OXYCODONE HCL 5 MG PO TABS: 5.0000 mg | ORAL_TABLET | Freq: Once | ORAL | Status: DC | PRN

## 2017-07-12 MED ORDER — PHENOL 1.4 % MT LIQD: 1.0000 | OROMUCOSAL | Status: DC | PRN

## 2017-07-12 MED ORDER — DOCUSATE SODIUM 100 MG PO CAPS
100.0000 mg | ORAL_CAPSULE | Freq: Two times a day (BID) | ORAL | Status: DC
  Administered 2017-07-12 – 2017-07-14 (×5): 100 mg via ORAL
  Filled 2017-07-12 (×5): qty 1

## 2017-07-12 MED ORDER — METOPROLOL SUCCINATE ER 50 MG PO TB24
50.0000 mg | ORAL_TABLET | Freq: Every day | ORAL | Status: DC
  Administered 2017-07-13 – 2017-07-14 (×2): 50 mg via ORAL
  Filled 2017-07-12 (×2): qty 1

## 2017-07-12 MED ORDER — BUPIVACAINE LIPOSOME 1.3 % IJ SUSP
20.0000 mL | INTRAMUSCULAR | Status: AC
  Administered 2017-07-12: 20 mL
  Filled 2017-07-12: qty 20

## 2017-07-12 MED ORDER — TRANEXAMIC ACID 1000 MG/10ML IV SOLN
INTRAVENOUS | Status: DC | PRN
  Administered 2017-07-12: 1000 mg via INTRAVENOUS

## 2017-07-12 MED ORDER — 0.9 % SODIUM CHLORIDE (POUR BTL) OPTIME
TOPICAL | Status: DC | PRN
  Administered 2017-07-12: 1000 mL

## 2017-07-12 MED ORDER — METHOCARBAMOL 500 MG PO TABS
ORAL_TABLET | ORAL | Status: AC
  Administered 2017-07-12: 500 mg via ORAL
  Filled 2017-07-12: qty 1

## 2017-07-12 MED ORDER — METHOCARBAMOL 1000 MG/10ML IJ SOLN
500.0000 mg | Freq: Four times a day (QID) | INTRAVENOUS | Status: DC | PRN
  Filled 2017-07-12: qty 5

## 2017-07-12 MED ORDER — SODIUM CHLORIDE 0.9 % IV SOLN
INTRAVENOUS | Status: DC
  Administered 2017-07-12: 16:00:00 via INTRAVENOUS

## 2017-07-12 MED ORDER — FENTANYL CITRATE (PF) 100 MCG/2ML IJ SOLN
INTRAMUSCULAR | Status: DC | PRN
  Administered 2017-07-12: 50 ug via INTRAVENOUS

## 2017-07-12 MED ORDER — PANTOPRAZOLE SODIUM 40 MG PO TBEC
40.0000 mg | DELAYED_RELEASE_TABLET | Freq: Every day | ORAL | Status: DC
  Administered 2017-07-13 – 2017-07-14 (×2): 40 mg via ORAL
  Filled 2017-07-12 (×2): qty 1

## 2017-07-12 MED ORDER — PROPOFOL 500 MG/50ML IV EMUL
INTRAVENOUS | Status: DC | PRN
  Administered 2017-07-12: 75 ug/kg/min via INTRAVENOUS

## 2017-07-12 MED ORDER — BUPIVACAINE HCL (PF) 0.25 % IJ SOLN
INTRAMUSCULAR | Status: DC | PRN
  Administered 2017-07-12: 20 mL

## 2017-07-12 MED ORDER — BUPIVACAINE HCL (PF) 0.25 % IJ SOLN
INTRAMUSCULAR | Status: AC
  Filled 2017-07-12: qty 30

## 2017-07-12 MED ORDER — PHENYLEPHRINE 40 MCG/ML (10ML) SYRINGE FOR IV PUSH (FOR BLOOD PRESSURE SUPPORT)
PREFILLED_SYRINGE | INTRAVENOUS | Status: DC | PRN
  Administered 2017-07-12 (×2): 80 ug via INTRAVENOUS

## 2017-07-12 MED ORDER — FLEET ENEMA 7-19 GM/118ML RE ENEM: 1.0000 | ENEMA | Freq: Once | RECTAL | Status: DC | PRN

## 2017-07-12 MED ORDER — ACETAMINOPHEN 325 MG PO TABS: 325.0000 mg | ORAL_TABLET | Freq: Four times a day (QID) | ORAL | Status: DC | PRN

## 2017-07-12 MED ORDER — OXYCODONE HCL 5 MG PO TABS
ORAL_TABLET | ORAL | Status: AC
  Administered 2017-07-12: 10 mg via ORAL
  Filled 2017-07-12: qty 2

## 2017-07-12 MED ORDER — MENTHOL 3 MG MT LOZG
1.0000 | LOZENGE | OROMUCOSAL | Status: DC | PRN
  Filled 2017-07-12: qty 9

## 2017-07-12 MED ORDER — CHLORHEXIDINE GLUCONATE 4 % EX LIQD: 60.0000 mL | Freq: Once | CUTANEOUS | Status: DC

## 2017-07-12 MED ORDER — POLYETHYLENE GLYCOL 3350 17 G PO PACK: 17.0000 g | PACK | Freq: Every day | ORAL | Status: DC | PRN

## 2017-07-12 MED ORDER — ASPIRIN EC 325 MG PO TBEC
325.0000 mg | DELAYED_RELEASE_TABLET | Freq: Every day | ORAL | Status: DC
  Administered 2017-07-13 – 2017-07-14 (×2): 325 mg via ORAL
  Filled 2017-07-12 (×2): qty 1

## 2017-07-12 MED ORDER — LORATADINE 10 MG PO TABS
10.0000 mg | ORAL_TABLET | Freq: Every day | ORAL | Status: DC
  Administered 2017-07-12 – 2017-07-14 (×3): 10 mg via ORAL
  Filled 2017-07-12 (×3): qty 1

## 2017-07-12 MED ORDER — ONDANSETRON HCL 4 MG PO TABS: 4.0000 mg | ORAL_TABLET | Freq: Four times a day (QID) | ORAL | Status: DC | PRN

## 2017-07-12 MED ORDER — SODIUM CHLORIDE 0.9 % IR SOLN
Status: DC | PRN
  Administered 2017-07-12: 3000 mL

## 2017-07-12 MED ORDER — METHOCARBAMOL 500 MG PO TABS
500.0000 mg | ORAL_TABLET | Freq: Four times a day (QID) | ORAL | Status: DC | PRN
  Administered 2017-07-12 – 2017-07-14 (×5): 500 mg via ORAL
  Filled 2017-07-12 (×4): qty 1

## 2017-07-12 MED ORDER — PROMETHAZINE HCL 25 MG/ML IJ SOLN: 6.2500 mg | INTRAMUSCULAR | Status: DC | PRN

## 2017-07-12 MED ORDER — PHENYLEPHRINE 40 MCG/ML (10ML) SYRINGE FOR IV PUSH (FOR BLOOD PRESSURE SUPPORT)
PREFILLED_SYRINGE | INTRAVENOUS | Status: AC
  Filled 2017-07-12: qty 10

## 2017-07-12 MED ORDER — FENTANYL CITRATE (PF) 250 MCG/5ML IJ SOLN
INTRAMUSCULAR | Status: AC
  Filled 2017-07-12: qty 5

## 2017-07-12 MED ORDER — METOCLOPRAMIDE HCL 5 MG/ML IJ SOLN: 5.0000 mg | Freq: Three times a day (TID) | INTRAMUSCULAR | Status: DC | PRN

## 2017-07-12 SURGICAL SUPPLY — 72 items
APL SKNCLS STERI-STRIP NONHPOA (GAUZE/BANDAGES/DRESSINGS) ×1
BANDAGE ACE 4X5 VEL STRL LF (GAUZE/BANDAGES/DRESSINGS) ×3 IMPLANT
BANDAGE ESMARK 6X9 LF (GAUZE/BANDAGES/DRESSINGS) ×1 IMPLANT
BENZOIN TINCTURE PRP APPL 2/3 (GAUZE/BANDAGES/DRESSINGS) ×3 IMPLANT
BLADE SAGITTAL 25.0X1.19X90 (BLADE) ×2 IMPLANT
BLADE SAGITTAL 25.0X1.19X90MM (BLADE) ×1
BLADE SAW SGTL 13X75X1.27 (BLADE) ×3 IMPLANT
BNDG CMPR 9X6 STRL LF SNTH (GAUZE/BANDAGES/DRESSINGS) ×1
BNDG CMPR MED 10X6 ELC LF (GAUZE/BANDAGES/DRESSINGS) ×1
BNDG ELASTIC 6X10 VLCR STRL LF (GAUZE/BANDAGES/DRESSINGS) ×3 IMPLANT
BNDG ESMARK 6X9 LF (GAUZE/BANDAGES/DRESSINGS) ×3
BOWL SMART MIX CTS (DISPOSABLE) ×3 IMPLANT
CAPT KNEE TOTAL 3 ATTUNE ×2 IMPLANT
CEMENT HV SMART SET (Cement) ×6 IMPLANT
CLOSURE WOUND 1/2 X4 (GAUZE/BANDAGES/DRESSINGS) ×2
COVER SURGICAL LIGHT HANDLE (MISCELLANEOUS) ×3 IMPLANT
CUFF TOURNIQUET SINGLE 34IN LL (TOURNIQUET CUFF) ×3 IMPLANT
DRAPE HALF SHEET 40X57 (DRAPES) ×2 IMPLANT
DRAPE ORTHO SPLIT 77X108 STRL (DRAPES) ×6
DRAPE SURG ORHT 6 SPLT 77X108 (DRAPES) ×2 IMPLANT
DRAPE U-SHAPE 47X51 STRL (DRAPES) ×3 IMPLANT
DRSG PAD ABDOMINAL 8X10 ST (GAUZE/BANDAGES/DRESSINGS) ×5 IMPLANT
DURAPREP 26ML APPLICATOR (WOUND CARE) ×6 IMPLANT
ELECT REM PT RETURN 9FT ADLT (ELECTROSURGICAL) ×3
ELECTRODE REM PT RTRN 9FT ADLT (ELECTROSURGICAL) ×1 IMPLANT
FACESHIELD WRAPAROUND (MASK) ×6 IMPLANT
FACESHIELD WRAPAROUND OR TEAM (MASK) ×2 IMPLANT
GAUZE SPONGE 4X4 12PLY STRL (GAUZE/BANDAGES/DRESSINGS) ×5 IMPLANT
GAUZE XEROFORM 5X9 LF (GAUZE/BANDAGES/DRESSINGS) ×3 IMPLANT
GLOVE BIOGEL PI IND STRL 8 (GLOVE) ×2 IMPLANT
GLOVE BIOGEL PI INDICATOR 8 (GLOVE) ×4
GLOVE ORTHO TXT STRL SZ7.5 (GLOVE) ×6 IMPLANT
GLOVE SURG SS PI 7.0 STRL IVOR (GLOVE) ×2 IMPLANT
GOWN STRL REUS W/ TWL LRG LVL3 (GOWN DISPOSABLE) ×1 IMPLANT
GOWN STRL REUS W/ TWL XL LVL3 (GOWN DISPOSABLE) ×1 IMPLANT
GOWN STRL REUS W/TWL 2XL LVL3 (GOWN DISPOSABLE) ×3 IMPLANT
GOWN STRL REUS W/TWL LRG LVL3 (GOWN DISPOSABLE) ×3
GOWN STRL REUS W/TWL XL LVL3 (GOWN DISPOSABLE) ×3
HANDPIECE INTERPULSE COAX TIP (DISPOSABLE) ×3
IMMOBILIZER KNEE 22 UNIV (SOFTGOODS) ×3 IMPLANT
KIT BASIN OR (CUSTOM PROCEDURE TRAY) ×3 IMPLANT
KIT TURNOVER KIT B (KITS) ×3 IMPLANT
MANIFOLD NEPTUNE II (INSTRUMENTS) ×3 IMPLANT
MARKER SKIN DUAL TIP RULER LAB (MISCELLANEOUS) ×3 IMPLANT
NDL 18GX1X1/2 (RX/OR ONLY) (NEEDLE) ×1 IMPLANT
NDL HYPO 25GX1X1/2 BEV (NEEDLE) ×1 IMPLANT
NEEDLE 18GX1X1/2 (RX/OR ONLY) (NEEDLE) ×3 IMPLANT
NEEDLE HYPO 25GX1X1/2 BEV (NEEDLE) ×3 IMPLANT
NS IRRIG 1000ML POUR BTL (IV SOLUTION) ×3 IMPLANT
PACK TOTAL JOINT (CUSTOM PROCEDURE TRAY) ×3 IMPLANT
PAD ARMBOARD 7.5X6 YLW CONV (MISCELLANEOUS) ×4 IMPLANT
PAD CAST 4YDX4 CTTN HI CHSV (CAST SUPPLIES) ×1 IMPLANT
PADDING CAST COTTON 4X4 STRL (CAST SUPPLIES) ×3
PADDING CAST COTTON 6X4 STRL (CAST SUPPLIES) ×3 IMPLANT
SET HNDPC FAN SPRY TIP SCT (DISPOSABLE) ×1 IMPLANT
STAPLER VISISTAT 35W (STAPLE) ×2 IMPLANT
STRIP CLOSURE SKIN 1/2X4 (GAUZE/BANDAGES/DRESSINGS) ×4 IMPLANT
SUCTION FRAZIER HANDLE 10FR (MISCELLANEOUS) ×2
SUCTION TUBE FRAZIER 10FR DISP (MISCELLANEOUS) ×1 IMPLANT
SUT VIC AB 0 CT1 27 (SUTURE) ×3
SUT VIC AB 0 CT1 27XBRD ANBCTR (SUTURE) ×1 IMPLANT
SUT VIC AB 1 CTX 36 (SUTURE) ×6
SUT VIC AB 1 CTX36XBRD ANBCTR (SUTURE) ×2 IMPLANT
SUT VIC AB 2-0 CT1 27 (SUTURE) ×6
SUT VIC AB 2-0 CT1 TAPERPNT 27 (SUTURE) ×2 IMPLANT
SUT VIC AB 3-0 X1 27 (SUTURE) ×3 IMPLANT
SYR 50ML LL SCALE MARK (SYRINGE) ×3 IMPLANT
SYR CONTROL 10ML LL (SYRINGE) ×3 IMPLANT
TOWEL OR 17X24 6PK STRL BLUE (TOWEL DISPOSABLE) ×3 IMPLANT
TOWEL OR 17X26 10 PK STRL BLUE (TOWEL DISPOSABLE) ×3 IMPLANT
TRAY CATH 16FR W/PLASTIC CATH (SET/KITS/TRAYS/PACK) ×2 IMPLANT
WATER STERILE IRR 1000ML POUR (IV SOLUTION) ×2 IMPLANT

## 2017-07-12 NOTE — Anesthesia Procedure Notes (Signed)
Procedure Name: MAC Date/Time: 07/12/2017 8:00 AM Performed by: Orlie Dakin, CRNA Pre-anesthesia Checklist: Patient identified, Emergency Drugs available, Suction available, Patient being monitored and Timeout performed Patient Re-evaluated:Patient Re-evaluated prior to induction Oxygen Delivery Method: Simple face mask Preoxygenation: Pre-oxygenation with 100% oxygen

## 2017-07-12 NOTE — Progress Notes (Signed)
Orthopedic Tech Progress Note Patient Details:  [name] Jul 20, 1955 [id number]  CPM Right Knee CPM Right Knee: On Right Knee Flexion (Degrees): 90 Right Knee Extension (Degrees): 0 Additional Comments: applied cpm at 0-90 degrees on rigt knee. pt tolerated application well.  bone foam provided at bedside.  right knee/leg  Post Interventions Patient Tolerated: Well Instructions Provided: Adjustment of device, Care of device  Kristopher Oppenheim 07/12/2017, 11:35 AM

## 2017-07-12 NOTE — Interval H&P Note (Signed)
History and Physical Interval Note:  07/12/2017 7:21 AM  [name]  has presented today for surgery, with the diagnosis of RIGHT KNEE OSTEOARTHRITIS  The various methods of treatment have been discussed with the patient and family. After consideration of risks, benefits and other options for treatment, the patient has consented to  Procedure(s): RIGHT TOTAL KNEE ARTHROPLASTY -CEMENTED (Right) as a surgical intervention .  The patient's history has been reviewed, patient examined, no change in status, stable for surgery.  I have reviewed the patient's chart and labs.  Questions were answered to the patient's satisfaction.     Marybelle Killings

## 2017-07-12 NOTE — Transfer of Care (Signed)
Immediate Anesthesia Transfer of Care Note  Patient: [name]  Procedure(s) Performed: RIGHT TOTAL KNEE ARTHROPLASTY -CEMENTED (Right Knee)  Patient Location: PACU  Anesthesia Type:Regional and Spinal  Level of Consciousness: drowsy and patient cooperative  Airway & Oxygen Therapy: Patient Spontanous Breathing and Patient connected to face mask oxygen  Post-op Assessment: Report given to RN and Post -op Vital signs reviewed and stable  Post vital signs: Reviewed and stable  Last Vitals:  Vitals Value Taken Time  BP 97/67 07/12/2017 10:21 AM  Temp    Pulse 88 07/12/2017 10:22 AM  Resp 19 07/12/2017 10:22 AM  SpO2 100 % 07/12/2017 10:22 AM  Vitals shown include unvalidated device data.  Last Pain:  Vitals:   07/12/17 1020  TempSrc:   PainSc: (P) 0-No pain      Patients Stated Pain Goal: 2 (63/87/56 4332)  Complications: No apparent anesthesia complications

## 2017-07-12 NOTE — Anesthesia Procedure Notes (Signed)
Spinal  Patient location during procedure: OR Start time: 07/12/2017 7:40 AM End time: 07/12/2017 7:44 AM Staffing Anesthesiologist: Audry Pili, MD Performed: anesthesiologist  Preanesthetic Checklist Completed: patient identified, surgical consent, pre-op evaluation, timeout performed, IV checked, risks and benefits discussed and monitors and equipment checked Spinal Block Patient position: sitting Prep: DuraPrep Patient monitoring: heart rate, cardiac monitor, continuous pulse ox and blood pressure Approach: midline Location: L3-4 Injection technique: single-shot Needle Needle type: Pencan  Needle gauge: 24 G Additional Notes Functioning IV was confirmed and monitors were applied. Sterile prep and drape, including hand hygiene, mask, and sterile gloves were used. The patient was positioned and the spine was prepped. The skin was anesthetized with lidocaine. Very stiff ligaments noted. Free flow of clear CSF was obtained prior to injecting local anesthetic into the CSF. The spinal needle aspirated freely following injection. The needle was carefully withdrawn. The patient tolerated the procedure well. Consent was obtained prior to the procedure with all questions answered and concerns addressed. Risks including, but not limited to, bleeding, infection, nerve damage, paralysis, failed block, inadequate analgesia, allergic reaction, high spinal, itching, and headache were discussed and the patient wished to proceed.  Renold Don, MD

## 2017-07-12 NOTE — Evaluation (Signed)
Physical Therapy Evaluation Patient Details Name: [name] MRN: [id number] DOB: Mar 08, 1956 Today's Date: 07/12/2017   History of Present Illness  Pt is a 62 y/o male s/p elective R TKA. PMH includes glaucoma, HTN, ankle fx surgery.   Clinical Impression  Pt is s/p surgery above with deficits below. Pt limited by pain this session and refused OOB mobility. Reviewed and practiced supine HEP, knee precautions and role of PT throughout acute stay. Anticipate pt will progress well once pain controlled. Will continue to follow acutely to maximize functional mobility independence and safety.     Follow Up Recommendations Follow surgeon's recommendation for DC plan and follow-up therapies;Supervision for mobility/OOB    Equipment Recommendations  None recommended by PT    Recommendations for Other Services OT consult     Precautions / Restrictions Precautions Precautions: Knee Precaution Booklet Issued: Yes (comment) Precaution Comments: Reviewed knee precautions and supine HEP. Reviewed how to apply KI as well.  Required Braces or Orthoses: Knee Immobilizer - Right Knee Immobilizer - Right: Other (comment)(at all times except when with PT and and in [place]) Restrictions Weight Bearing Restrictions: Yes RLE Weight Bearing: Weight bearing as tolerated      Mobility  Bed Mobility               General bed mobility comments: Pt refusing OOB mobility secondary to increased pain and wanted to wait until later.   Transfers                    Ambulation/Gait                Stairs            Wheelchair Mobility    Modified Rankin (Stroke Patients Only)       Balance                                             Pertinent Vitals/Pain Pain Assessment: 0-10 Pain Score: 9  Pain Location: R knee  Pain Descriptors / Indicators: Aching;Operative site guarding Pain Intervention(s): Monitored during session;Limited activity within  patient's tolerance;Repositioned    Home Living Family/patient expects to be discharged to:: Private residence Living Arrangements: Spouse/significant other Available Help at Discharge: Family;Available 24 hours/day Type of Home: House Home Access: Stairs to enter Entrance Stairs-Rails: None Entrance Stairs-Number of Steps: 1 Home Layout: One level Home Equipment: Walker - 2 wheels;Bedside commode      Prior Function Level of Independence: Independent               Hand Dominance   Dominant Hand: Right    Extremity/Trunk Assessment   Upper Extremity Assessment Upper Extremity Assessment: Defer to OT evaluation    Lower Extremity Assessment Lower Extremity Assessment: RLE deficits/detail RLE Deficits / Details: Reports decreased sensation in RLE. Deficits consistent with post op pain and weakness. Able to perform ther ex below.        Communication   Communication: No difficulties  Cognition Arousal/Alertness: Awake/alert Behavior During Therapy: WFL for tasks assessed/performed Overall Cognitive Status: Within Functional Limits for tasks assessed                                        General Comments      Exercises Total  Joint Exercises Ankle Circles/Pumps: AROM;20 reps;Supine;Both Quad Sets: AROM;Right;10 reps Towel Squeeze: AROM;Right;10 reps Heel Slides: (verbal education to perform later. )   Assessment/Plan    PT Assessment Patient needs continued PT services  PT Problem List Decreased strength;Decreased range of motion;Decreased activity tolerance;Decreased balance;Decreased mobility;Decreased knowledge of use of DME;Decreased knowledge of precautions;Pain;Impaired sensation       PT Treatment Interventions DME instruction;Stair training;Gait training;Functional mobility training;Therapeutic activities;Balance training;Therapeutic exercise;Neuromuscular re-education;Patient/family education    PT Goals (Current goals can be  found in the Care Plan section)  Acute Rehab PT Goals Patient Stated Goal: to go home  PT Goal Formulation: With patient Time For Goal Achievement: 07/26/17 Potential to Achieve Goals: Good    Frequency 7X/week   Barriers to discharge        Co-evaluation               AM-PAC PT "6 Clicks" Daily Activity  Outcome Measure Difficulty turning over in bed (including adjusting bedclothes, sheets and blankets)?: A Little Difficulty moving from lying on back to sitting on the side of the bed? : Unable Difficulty sitting down on and standing up from a chair with arms (e.g., wheelchair, bedside commode, etc,.)?: Unable Help needed moving to and from a bed to chair (including a wheelchair)?: A Little Help needed walking in hospital room?: A Little Help needed climbing 3-5 steps with a railing? : A Lot 6 Click Score: 13    End of Session   Activity Tolerance: Patient limited by pain Patient left: in bed;with call bell/phone within reach;with family/visitor present Nurse Communication: Mobility status PT Visit Diagnosis: Other abnormalities of gait and mobility (R26.89);Pain Pain - Right/Left: Right Pain - part of body: Knee    Time: 7209-4709 PT Time Calculation (min) (ACUTE ONLY): 20 min   Charges:   PT Evaluation $PT Eval Low Complexity: 1 Low     PT G Codes:        [name], PT, DPT  Acute Rehabilitation Services  Pager: [phone]   [name] 07/12/2017, 5:57 PM

## 2017-07-12 NOTE — Anesthesia Procedure Notes (Signed)
Anesthesia Regional Block: Adductor canal block   Pre-Anesthetic Checklist: ,, timeout performed, Correct Patient, Correct Site, Correct Laterality, Correct Procedure, Correct Position, site marked, Risks and benefits discussed,  Surgical consent,  Pre-op evaluation,  At surgeon's request and post-op pain management  Laterality: Right  Prep: chloraprep       Needles:  Injection technique: Single-shot  Needle Type: Echogenic Needle     Needle Length: 10cm  Needle Gauge: 21     Additional Needles:   Narrative:  Start time: 07/12/2017 7:03 AM End time: 07/12/2017 7:06 AM Injection made incrementally with aspirations every 5 mL.  Performed by: Personally  Anesthesiologist: Audry Pili, MD  Additional Notes: No pain on injection. No increased resistance to injection. Injection made in 5cc increments. Good needle visualization. Patient tolerated the procedure well.

## 2017-07-12 NOTE — Op Note (Signed)
Preop diagnosis: Right knee primary osteoarthritis  Postoperative diagnosis: Same  Procedure: Right cemented total knee arthroplasty.  Size 6 femur size 5 tibia 5 mm rotating platform 41 mm 3 peg patella Depuy Attune.      Surgeon: Rodell Perna MD  Assistant: Benjiman Core PA-C medically necessary and present for the entire procedure  Anesthesia preoperative adductor block plus spinal plus Exparel.  Procedure: After standard prepping and draping timeout procedure preoperative Ancef prophylaxis proximal thigh tourniquet lateral post heel bump prepping from the tourniquet the tip of the toes usual total knee split sheets drapes impervious stockinette Coban was applied sterile skin marker Betadine Steri-Drape sealing the skin wrapping the leg with Esmarch timeout procedure and tourniquet inflation.  Midline incision was made medial parapatellar incision.  There were large spurs medially and bone eburnated with varus deformity.  Tricompartmental degenerative changes are seen with most severe in the medial compartment followed by patellofemoral.  Spurs were removed intramedullary hole was drilled in the femur after resection of the remaining lateral meniscus remnant.  There is tearing of the medial meniscus.  Patient had an old scar medial bleed from an open meniscectomy many years ago.  There was some calcification of the deep collateral ligament.  This was removed as well as marginal osteophytes due to the excessive tightness.  Intramedullary hole drilled in the femur initially 10 mm was resected off the femur.  Next ankle clamp guide was placed on the tibia resection of the tibia.  The medial resection just took bone appropriately but still remain relatively tight with flexion block and an additional 2 mm were resected.  This allowed 5 mm block to fit in nicely with extension.  Posterior medial corner was released including ACL PCL.  Large posterior spurs removed off the femur on the medial side there are  minimal spurs posterior lateral.  Chamfer cuts box cuts made on the femur trial sizing.  #6 femur #5 tibia.  41 mm patella.  There was pulse lavage mixing of the cement under pressure and then tibia cemented first followed by femur placement of permanent 5 mm poly-which allowed full extension and the patellar component cemented in with a patellar clamp.  Cement was hard to 15 minutes tourniquet was deflated.  Hemostasis was obtained in standard layered closure including interrupted deep #1 Vicryl in the deep capsule 2-0 Vicryl subtendinous tissue skin closure postop dressing and knee immobilizer.

## 2017-07-12 NOTE — Anesthesia Postprocedure Evaluation (Signed)
Anesthesia Post Note  Patient: [name]  Procedure(s) Performed: RIGHT TOTAL KNEE ARTHROPLASTY -CEMENTED (Right Knee)     Patient location during evaluation: PACU Anesthesia Type: Spinal Level of consciousness: awake and alert Pain management: pain level controlled Vital Signs Assessment: post-procedure vital signs reviewed and stable Respiratory status: spontaneous breathing, respiratory function stable and nonlabored ventilation Cardiovascular status: blood pressure returned to baseline and stable Postop Assessment: spinal receding and no apparent nausea or vomiting Anesthetic complications: no    Last Vitals:  Vitals:   07/12/17 1228 07/12/17 1258  BP: 91/66 98/68  Pulse: 73 74  Resp: 18 12  Temp:    SpO2: 98% 100%    Last Pain:  Vitals:   07/12/17 1220  TempSrc:   PainSc: 0-No pain                 Audry Pili

## 2017-07-13 LAB — BASIC METABOLIC PANEL
ANION GAP: 10 (ref 5–15)
BUN: 18 mg/dL (ref 6–20)
CALCIUM: 8.1 mg/dL — AB (ref 8.9–10.3)
CO2: 21 mmol/L — ABNORMAL LOW (ref 22–32)
Chloride: 101 mmol/L (ref 101–111)
Creatinine, Ser: 1 mg/dL (ref 0.61–1.24)
GFR calc Af Amer: >60 mL/min (ref >60)
GLUCOSE: 150 mg/dL — AB (ref 65–99)
Potassium: 3.7 mmol/L (ref 3.5–5.1)
SODIUM: 132 mmol/L — AB (ref 135–145)

## 2017-07-13 LAB — CBC
HCT: 35.3 % — ABNORMAL LOW (ref 39.0–52.0)
Hemoglobin: 12 g/dL — ABNORMAL LOW (ref 13.0–17.0)
MCH: 30.9 pg (ref 26.0–34.0)
MCHC: 34 g/dL (ref 30.0–36.0)
MCV: 91 fL (ref 78.0–100.0)
PLATELETS: 228 10*3/uL (ref 150–400)
RBC: 3.88 MIL/uL — AB (ref 4.22–5.81)
RDW: 13 % (ref 11.5–15.5)
WBC: 14 10*3/uL — AB (ref 4.0–10.5)

## 2017-07-13 MED ORDER — OXYCODONE HCL 5 MG PO TABS
5.0000 mg | ORAL_TABLET | ORAL | Status: DC | PRN
  Administered 2017-07-13 – 2017-07-14 (×5): 15 mg via ORAL
  Filled 2017-07-13 (×5): qty 3

## 2017-07-13 MED ORDER — ACETAMINOPHEN 325 MG PO TABS
325.0000 mg | ORAL_TABLET | Freq: Four times a day (QID) | ORAL | Status: DC | PRN
  Administered 2017-07-13 – 2017-07-14 (×2): 650 mg via ORAL
  Filled 2017-07-13 (×2): qty 2

## 2017-07-13 NOTE — Progress Notes (Signed)
Orthopedic Tech Progress Note Patient Details:  [name] 06-Jun-1955 [id number]  CPM Right Knee CPM Right Knee: On Right Knee Flexion (Degrees): 90 Right Knee Extension (Degrees): 0 Additional Comments: applied cpm at 0-90 degrees on rigt knee. pt tolerated application well.  bone foam provided at bedside.  right knee/leg  Post Interventions Patient Tolerated: Well Instructions Provided: Adjustment of device, Care of device  [name] 07/13/2017, 3:59 PM

## 2017-07-13 NOTE — Progress Notes (Signed)
Physical Therapy Treatment Patient Details Name: [name] MRN: [id number] DOB: Jan 23, 1956 Today's Date: 07/13/2017    History of Present Illness Pt is a 62 y/o male s/p elective R TKA. PMH includes glaucoma, HTN, ankle fx surgery.     PT Comments    Patient agreed to get out of bed with therapy now that his pain is under cvontrol. [place] continues to be limited by pain. He was able to walk 8'. He hopes to go home soon but therapy advised him he will likely have to improve his gait distance and ability to transfer. Therapy will follow up for BID treatment in the afternoon.    Follow Up Recommendations  Follow surgeon's recommendation for DC plan and follow-up therapies;Supervision for mobility/OOB     Equipment Recommendations  None recommended by PT    Recommendations for Other Services       Precautions / Restrictions Precautions Precautions: Knee Precaution Booklet Issued: Yes (comment) Precaution Comments: Reviewed knee precautions and supine HEP. Reviewed how to apply KI as well.  Required Braces or Orthoses: Knee Immobilizer - Right Knee Immobilizer - Right: Other (comment) Restrictions Weight Bearing Restrictions: Yes RLE Weight Bearing: Weight bearing as tolerated    Mobility  Bed Mobility Overal bed mobility: Needs Assistance Bed Mobility: Supine to Sit     Supine to sit: Min assist     General bed mobility comments: min a to get right LE to the edge of the bed  Transfers Overall transfer level: Needs assistance Equipment used: Rolling walker (2 wheeled) Transfers: Sit to/from Stand Sit to Stand: Min assist         General transfer comment: min a sit to stand for strength and initial balance. Patient reported minor syncoipe but it decreased quickly.   Ambulation/Gait Ambulation/Gait assistance: Min assist Ambulation Distance (Feet): 8 Feet Assistive device: Rolling walker (2 wheeled) Gait Pattern/deviations: Step-through pattern   Gait  velocity interpretation: Below normal speed for age/gender General Gait Details: slow stpe to gait pattern. Patient reported increased pain in weight bearing.    Stairs            Wheelchair Mobility    Modified Rankin (Stroke Patients Only)       Balance                                            Cognition Arousal/Alertness: Awake/alert Behavior During Therapy: WFL for tasks assessed/performed Overall Cognitive Status: Within Functional Limits for tasks assessed                                        Exercises Total Joint Exercises Ankle Circles/Pumps: AROM;20 reps;Supine;Both Quad Sets: AROM;Right;10 reps Goniometric ROM: 13-40 degrees     General Comments        Pertinent Vitals/Pain Pain Assessment: Faces Faces Pain Scale: Hurts even more Pain Descriptors / Indicators: Aching;Operative site guarding Pain Intervention(s): Limited activity within patient's tolerance;Monitored during session;Premedicated before session;Relaxation    Home Living                      Prior Function            PT Goals (current goals can now be found in the care plan section) Acute Rehab PT Goals Patient Stated  Goal: to go home  PT Goal Formulation: With patient Time For Goal Achievement: 07/26/17 Potential to Achieve Goals: Good    Frequency    7X/week      PT Plan Current plan remains appropriate    Co-evaluation              AM-PAC PT "6 Clicks" Daily Activity  Outcome Measure  Difficulty turning over in bed (including adjusting bedclothes, sheets and blankets)?: Unable Difficulty moving from lying on back to sitting on the side of the bed? : Unable Difficulty sitting down on and standing up from a chair with arms (e.g., wheelchair, bedside commode, etc,.)?: Unable Help needed moving to and from a bed to chair (including a wheelchair)?: Total Help needed walking in hospital room?: Total Help needed climbing  3-5 steps with a railing? : Total 6 Click Score: 6    End of Session Equipment Utilized During Treatment: Gait belt;Right knee immobilizer Activity Tolerance: Patient limited by pain Patient left: in chair;with call bell/phone within reach Nurse Communication: Mobility status PT Visit Diagnosis: Other abnormalities of gait and mobility (R26.89);Pain Pain - Right/Left: Right Pain - part of body: Knee     Time: 1035-1100 PT Time Calculation (min) (ACUTE ONLY): 25 min  Charges:  $Gait Training: 8-22 mins $Therapeutic Activity: 8-22 mins                    G Codes:         Carney Living PT DPT  07/13/2017, 1:01 PM

## 2017-07-13 NOTE — Progress Notes (Signed)
Physical Therapy Evaluation Patient Details Name: [name] MRN: [id number] DOB: 1955-05-05 Today's Date: 07/13/2017   History of Present Illness  Pt is a 62 y/o male s/p elective R TKA. PMH includes glaucoma, HTN, ankle fx surgery.   Clinical Impression  Patient made good progress in the afternoon. He increased his gait distance and decreased his amount of assist needed for gait and transfers. He may require stair training in the morning. He is hoping to go home.     Follow Up Recommendations Follow surgeon's recommendation for DC plan and follow-up therapies;Supervision for mobility/OOB    Equipment Recommendations  None recommended by PT    Recommendations for Other Services       Precautions / Restrictions Precautions Precautions: Knee Precaution Booklet Issued: Yes (comment) Precaution Comments: Reviewed knee precautions and supine HEP. Reviewed how to apply KI as well.  Required Braces or Orthoses: Knee Immobilizer - Right Knee Immobilizer - Right: Other (comment) Restrictions Weight Bearing Restrictions: Yes RLE Weight Bearing: Weight bearing as tolerated      Mobility  Bed Mobility Overal bed mobility: Needs Assistance Bed Mobility: Sit to Supine     Supine to sit: Min assist     General bed mobility comments: Min a to get left leg back into the bed   Transfers Overall transfer level: Needs assistance Equipment used: Rolling walker (2 wheeled) Transfers: Sit to/from Stand Sit to Stand: Min guard         General transfer comment: min a sit to stand for strength and initial balance. Patient reported minor syncoipe but it decreased quickly. Patient also able to stand for 4 minutes while going to the bathroom.   Ambulation/Gait Ambulation/Gait assistance: Min assist Ambulation Distance (Feet): 35 Feet Assistive device: Rolling walker (2 wheeled) Gait Pattern/deviations: Step-to pattern   Gait velocity interpretation: Below normal speed for  age/gender General Gait Details: improved speedwith gait. Improved distance at well   Stairs            Wheelchair Mobility    Modified Rankin (Stroke Patients Only)       Balance                                             Pertinent Vitals/Pain Pain Assessment: Faces Faces Pain Scale: Hurts even more Pain Descriptors / Indicators: Aching;Operative site guarding Pain Intervention(s): Limited activity within patient's tolerance    Home Living                        Prior Function                 Hand Dominance        Extremity/Trunk Assessment                Communication      Cognition Arousal/Alertness: Awake/alert Behavior During Therapy: WFL for tasks assessed/performed Overall Cognitive Status: Within Functional Limits for tasks assessed                                        General Comments      Exercises Total Joint Exercises Ankle Circles/Pumps: AROM;20 reps;Supine;Both Quad Sets: AROM;Right;10 reps Goniometric ROM: 13-40 degrees    Assessment/Plan    PT Assessment  PT Problem List         PT Treatment Interventions      PT Goals (Current goals can be found in the Care Plan section)  Acute Rehab PT Goals Patient Stated Goal: to go home  PT Goal Formulation: With patient Time For Goal Achievement: 07/26/17 Potential to Achieve Goals: Good    Frequency 7X/week   Barriers to discharge        Co-evaluation               AM-PAC PT "6 Clicks" Daily Activity  Outcome Measure Difficulty turning over in bed (including adjusting bedclothes, sheets and blankets)?: Unable Difficulty moving from lying on back to sitting on the side of the bed? : Unable Difficulty sitting down on and standing up from a chair with arms (e.g., wheelchair, bedside commode, etc,.)?: A Little Help needed moving to and from a bed to chair (including a wheelchair)?: Total Help needed walking in  hospital room?: A Lot Help needed climbing 3-5 steps with a railing? : Total 6 Click Score: 8    End of Session Equipment Utilized During Treatment: Gait belt;Right knee immobilizer Activity Tolerance: Patient limited by pain Patient left: in chair;with call bell/phone within reach Nurse Communication: Mobility status PT Visit Diagnosis: Other abnormalities of gait and mobility (R26.89);Pain Pain - Right/Left: Right Pain - part of body: Knee    Time: 1791-5056 PT Time Calculation (min) (ACUTE ONLY): 24 min   Charges:     PT Treatments $Gait Training: 8-22 mins $Therapeutic Activity: 8-22 mins   PT G Codes:         Carney Living PT DPT  07/13/2017, 3:19 PM

## 2017-07-13 NOTE — Progress Notes (Signed)
Subjective: 1 Day Post-Op Procedure(s) (LRB): RIGHT TOTAL KNEE ARTHROPLASTY -CEMENTED (Right) Patient reports pain as moderate.  Doing ok this am.  Objective: Vital signs in last 24 hours: Temp:  [97.7 F (36.5 C)-101.5 F (38.6 C)] 99.7 F (37.6 C) (04/06 0549) Pulse Rate:  [69-90] 80 (04/06 0549) Resp:  [12-20] 17 (04/06 0549) BP: (91-133)/(52-81) 133/72 (04/06 0549) SpO2:  [95 %-100 %] 97 % (04/06 0549)  Intake/Output from previous day: 04/05 0701 - 04/06 0700 In: 1100 [I.V.:1100] Out: 1100 [Urine:1100] Intake/Output this shift: No intake/output data recorded.  No results for input(s): HGB in the last 72 hours. No results for input(s): WBC, RBC, HCT, PLT in the last 72 hours. No results for input(s): NA, K, CL, CO2, BUN, CREATININE, GLUCOSE, CALCIUM in the last 72 hours. No results for input(s): LABPT, INR in the last 72 hours.  Neurologically intact Neurovascular intact Sensation intact distally Intact pulses distally Dorsiflexion/Plantar flexion intact Incision: dressing C/D/I No cellulitis present Compartment soft  Anticipated LOS equal to or greater than 2 midnights due to - Age 64 and older with one or more of the following:  - Obesity  - Expected need for hospital services (PT, OT, Nursing) required for safe  discharge  - Anticipated need for postoperative skilled nursing care or inpatient rehab  - Active co-morbidities: None OR   - Unanticipated findings during/Post Surgery: None  - Patient is a high risk of re-admission due to: None   Assessment/Plan: 1 Day Post-Op Procedure(s) (LRB): RIGHT TOTAL KNEE ARTHROPLASTY -CEMENTED (Right) Advance diet Up with therapy Discharge home with home health likely Sunday WBAT RLE Knee immobilizer until PT ok with d/c     Aundra Dubin 07/13/2017, 8:08 AM

## 2017-07-13 NOTE — Progress Notes (Signed)
Orthopedic Tech Progress Note Patient Details:  [name] 1956/01/09 [id number]  CPM Right Knee CPM Right Knee: On Right Knee Flexion (Degrees): 90 Right Knee Extension (Degrees): 0 Additional Comments: applied cpm at 0-90 degrees on rigt knee. pt tolerated application well.  bone foam provided at bedside.  right knee/leg  Post Interventions Patient Tolerated: Well Instructions Provided: Adjustment of device, Care of device  [name] 07/13/2017, 8:18 AM

## 2017-07-14 LAB — CBC
HCT: 34 % — ABNORMAL LOW (ref 39.0–52.0)
Hemoglobin: 11.5 g/dL — ABNORMAL LOW (ref 13.0–17.0)
MCH: 30.7 pg (ref 26.0–34.0)
MCHC: 33.8 g/dL (ref 30.0–36.0)
MCV: 90.7 fL (ref 78.0–100.0)
PLATELETS: 245 10*3/uL (ref 150–400)
RBC: 3.75 MIL/uL — ABNORMAL LOW (ref 4.22–5.81)
RDW: 12.9 % (ref 11.5–15.5)
WBC: 16.4 10*3/uL — AB (ref 4.0–10.5)

## 2017-07-14 MED ORDER — SENNOSIDES-DOCUSATE SODIUM 8.6-50 MG PO TABS: 1.0000 | ORAL_TABLET | Freq: Every evening | ORAL | 1 refills | Status: DC | PRN

## 2017-07-14 MED ORDER — ASPIRIN EC 325 MG PO TBEC: 325.0000 mg | DELAYED_RELEASE_TABLET | Freq: Two times a day (BID) | ORAL | 0 refills | Status: DC

## 2017-07-14 MED ORDER — OXYCODONE-ACETAMINOPHEN 5-325 MG PO TABS: 1.0000 | ORAL_TABLET | ORAL | 0 refills | Status: DC | PRN

## 2017-07-14 NOTE — Plan of Care (Signed)

## 2017-07-14 NOTE — Progress Notes (Signed)
Pt discharged home with family. AVS and scripts reviewed and given to patient. All belongings sent with patient, including cane that was delivered. VSS. BP (!) 121/45 (BP Location: Right Arm)   Pulse 80   Temp 98.4 F (36.9 C) (Oral)   Resp 18   Ht 5\' 5"  (1.651 m)   Wt 100 kg (220 lb 8 oz)   SpO2 97%   BMI 36.69 kg/m

## 2017-07-14 NOTE — Progress Notes (Signed)
Physical Therapy Treatment Patient Details Name: [name] MRN: [id number] DOB: May 05, 1955 Today's Date: 07/14/2017    History of Present Illness Pt is a 62 y/o male s/p elective R TKA. PMH includes glaucoma, HTN, ankle fx surgery.     PT Comments    Continuing work on functional mobility and activity tolerance;  Noting significant limitations in R knee flexion, tends to activate quad for muscle guarding; Reviewed stairs, and able to walk further; OK for dc home from PT standpoint    Follow Up Recommendations  Follow surgeon's recommendation for DC plan and follow-up therapies;Supervision for mobility/OOB     Equipment Recommendations  None recommended by PT    Recommendations for Other Services       Precautions / Restrictions Precautions Precautions: Knee Precaution Comments: Pt educated to not allow any pillow or bolster under knee for healing with optimal range of motion.  Restrictions RLE Weight Bearing: Weight bearing as tolerated    Mobility  Bed Mobility Overal bed mobility: Needs Assistance Bed Mobility: Supine to Sit     Supine to sit: Min guard     General bed mobility comments: Cues for techqnieu; slow moving, but did not need physical assist  Transfers Overall transfer level: Needs assistance Equipment used: Rolling walker (2 wheeled) Transfers: Sit to/from Stand Sit to Stand: Min guard         General transfer comment: Cues for hand placement and safety  Ambulation/Gait Ambulation/Gait assistance: Min guard Ambulation Distance (Feet): 140 Feet Assistive device: Rolling walker (2 wheeled) Gait Pattern/deviations: Step-through pattern(emerging) Gait velocity: slowed   General Gait Details: Cues for upright posture and to activate quad for stance stability   Stairs Stairs: Yes   Stair Management: No rails;With walker;Forwards;Backwards;Step to pattern Number of Stairs: 1(x2) General stair comments: Cues for sequence, rW use, and  technqiue; practiced forward technqiue and backward technqiue; better in afternoon session  Wheelchair Mobility    Modified Rankin (Stroke Patients Only)       Balance                                            Cognition Arousal/Alertness: Awake/alert Behavior During Therapy: WFL for tasks assessed/performed Overall Cognitive Status: Within Functional Limits for tasks assessed                                        Exercises Total Joint Exercises Ankle Circles/Pumps: AROM;20 reps;Supine;Both Quad Sets: AROM;Right;10 reps Heel Slides: AAROM;Right;10 reps;Limitations Heel Slides Limitations: Flexion ROM limited by pain and quad muscle guarding Straight Leg Raises: AAROM;Right;10 reps Goniometric ROM: approx 8-50 deg    General Comments        Pertinent Vitals/Pain Pain Assessment: Faces Faces Pain Scale: Hurts whole lot Pain Location: R knee with flexion therex Pain Descriptors / Indicators: Aching;Operative site guarding;Guarding Pain Intervention(s): Monitored during session    Home Living                      Prior Function            PT Goals (current goals can now be found in the care plan section) Acute Rehab PT Goals Patient Stated Goal: to go home  PT Goal Formulation: With patient Time For Goal Achievement: 07/26/17 Potential to  Achieve Goals: Good Progress towards PT goals: Progressing toward goals    Frequency    7X/week      PT Plan Current plan remains appropriate    Co-evaluation              AM-PAC PT "6 Clicks" Daily Activity  Outcome Measure  Difficulty turning over in bed (including adjusting bedclothes, sheets and blankets)?: A Lot Difficulty moving from lying on back to sitting on the side of the bed? : A Lot Difficulty sitting down on and standing up from a chair with arms (e.g., wheelchair, bedside commode, etc,.)?: A Little Help needed moving to and from a bed to chair  (including a wheelchair)?: A Little Help needed walking in hospital room?: A Little Help needed climbing 3-5 steps with a railing? : A Little 6 Click Score: 16    End of Session Equipment Utilized During Treatment: Gait belt Activity Tolerance: Patient tolerated treatment well Patient left: in chair;with call bell/phone within reach Nurse Communication: Mobility status PT Visit Diagnosis: Other abnormalities of gait and mobility (R26.89);Pain Pain - Right/Left: Right Pain - part of body: Knee     Time: 1401-1446 PT Time Calculation (min) (ACUTE ONLY): 45 min  Charges:  $Gait Training: 23-37 mins $Therapeutic Exercise: 8-22 mins                    G Codes:       Roney Marion, PT  Acute Rehabilitation Services Pager [phone] Office [facility] 07/14/2017, 5:03 PM

## 2017-07-14 NOTE — Progress Notes (Signed)
Subjective:  2 Days Post-Op Procedure(s) (LRB): RIGHT TOTAL KNEE ARTHROPLASTY -CEMENTED (Right) Patient reports pain as mild.  Doing well this am.  Objective: Vital signs in last 24 hours: Temp:  [98.6 F (37 C)-99.3 F (37.4 C)] 99.2 F (37.3 C) (04/07 0558) Pulse Rate:  [80-96] 96 (04/07 0558) Resp:  [17] 17 (04/06 1358) BP: (122-140)/(71-80) 140/75 (04/07 0558) SpO2:  [93 %-95 %] 95 % (04/07 0558)  Intake/Output from previous day: 04/06 0701 - 04/07 0700 In: -  Out: 800 [Urine:800] Intake/Output this shift: No intake/output data recorded.  Recent Labs    07/13/17 1131  HGB 12.0*   Recent Labs    07/13/17 1131  WBC 14.0*  RBC 3.88*  HCT 35.3*  PLT 228   Recent Labs    07/13/17 1131  NA 132*  K 3.7  CL 101  CO2 21*  BUN 18  CREATININE 1.00  GLUCOSE 150*  CALCIUM 8.1*   No results for input(s): LABPT, INR in the last 72 hours.  Neurologically intact Neurovascular intact Sensation intact distally Intact pulses distally Dorsiflexion/Plantar flexion intact Incision: dressing C/D/I No cellulitis present Compartment soft  Anticipated LOS equal to or greater than 2 midnights due to - Age 56 and older with one or more of the following:  - Obesity  - Expected need for hospital services (PT, OT, Nursing) required for safe  discharge  - Anticipated need for postoperative skilled nursing care or inpatient rehab  - Active co-morbidities: None OR   - Unanticipated findings during/Post Surgery: None  - Patient is a high risk of re-admission due to: None   Assessment/Plan: 2 Days Post-Op Procedure(s) (LRB): RIGHT TOTAL KNEE ARTHROPLASTY -CEMENTED (Right) Advance diet Up with therapy Discharge home with home health after second PT session WBAT RLE Dry dressing change prn    Aundra Dubin 07/14/2017, 9:17 AM

## 2017-07-14 NOTE — Discharge Summary (Signed)
Patient ID: [name] MRN: [id number] DOB/AGE: 64-Oct-1957 64 y.o.  Admit date: 07/12/2017 Discharge date: 07/14/2017  Admission Diagnoses:  Active Problems:   Arthritis of right knee   Discharge Diagnoses:  Same  Past Medical History:  Diagnosis Date  . Acid reflux   . Arthritis   . Depression   . Glaucoma   . Hypertension     Surgeries: Procedure(s): RIGHT TOTAL KNEE ARTHROPLASTY -CEMENTED on 07/12/2017   Consultants:   Discharged Condition: Improved  Hospital Course: [name] is an 62 y.o. male who was admitted 07/12/2017 for operative treatment of<principal problem not specified>. Patient has severe unremitting pain that affects sleep, daily activities, and work/hobbies. After pre-op clearance the patient was taken to the operating room on 07/12/2017 and underwent  Procedure(s): RIGHT TOTAL KNEE ARTHROPLASTY -CEMENTED.    Patient was given perioperative antibiotics:  Anti-infectives (From admission, onward)   Start     Dose/Rate Route Frequency Ordered Stop   07/12/17 1530  ceFAZolin (ANCEF) IVPB 1 g/50 mL premix     1 g 100 mL/hr over 30 Minutes Intravenous Every 8 hours 07/12/17 1445 07/12/17 2240   07/12/17 0605  ceFAZolin (ANCEF) IVPB 2g/100 mL premix     2 g 200 mL/hr over 30 Minutes Intravenous On call to O.R. 07/12/17 [id number] 07/12/17 0747       Patient was given sequential compression devices, early ambulation, and chemoprophylaxis to prevent DVT.  Patient benefited maximally from hospital stay and there were no complications.    Recent vital signs:  Patient Vitals for the past 24 hrs:  BP Temp Temp src Pulse Resp SpO2  07/14/17 0558 140/75 99.2 F (37.3 C) Oral 96 - 95 %  07/13/17 2056 122/80 98.6 F (37 C) Oral 80 - 94 %  07/13/17 1358 130/71 99.3 F (37.4 C) Oral 86 17 93 %     Recent laboratory studies:  Recent Labs    07/13/17 1131  WBC 14.0*  HGB 12.0*  HCT 35.3*  PLT 228  NA 132*  K 3.7  CL 101  CO2 21*  BUN 18  CREATININE  1.00  GLUCOSE 150*  CALCIUM 8.1*     Discharge Medications:   Allergies as of 07/14/2017   No Known Allergies     Medication List    TAKE these medications   aspirin EC 325 MG tablet Take 1 tablet (325 mg total) by mouth 2 (two) times daily.   diclofenac 75 MG EC tablet Commonly known as:  VOLTAREN Take 75 mg by mouth 2 (two) times daily.   Fish Oil 1000 MG Caps Take 1 capsule by mouth daily.   loratadine 10 MG tablet Commonly known as:  CLARITIN Take 10 mg by mouth daily.   metoprolol succinate 50 MG 24 hr tablet Commonly known as:  TOPROL-XL TAKE 1 TABLET BY MOUTH EVERY DAY   multivitamin with minerals tablet Take 1 tablet by mouth daily.   omeprazole 20 MG capsule Commonly known as:  PRILOSEC Take 20 mg by mouth daily as needed.   oxyCODONE-acetaminophen 5-325 MG tablet Commonly known as:  PERCOCET Take 1-2 tablets by mouth every 4 (four) hours as needed for severe pain.   senna-docusate 8.6-50 MG tablet Commonly known as:  SENOKOT S Take 1 tablet by mouth at bedtime as needed.   travoprost (benzalkonium) 0.004 % ophthalmic solution Commonly known as:  TRAVATAN Place 1 drop into both eyes at bedtime.       Diagnostic Studies: Dg Chest 2  View  Result Date: 07/03/2017 CLINICAL DATA:  Preoperative evaluation for total knee replacement, history hypertension, former smoker EXAM: CHEST - 2 VIEW COMPARISON:  05/08/2008 FINDINGS: Normal heart size, mediastinal contours, and pulmonary vascularity. Lungs clear. Incidentally noted tiny calcified granuloma LEFT upper lobe. No pleural effusion or pneumothorax. Scattered endplate spur formation thoracic spine. No acute bony lesions. IMPRESSION: No acute abnormalities. Electronically Signed   By: Lavonia Dana M.D.   On: 07/03/2017 16:59   Dg Knee 1-2 Views Right  Result Date: 07/12/2017 CLINICAL DATA:  Right total knee arthroplasty EXAM: RIGHT KNEE - 1-2 VIEW COMPARISON:  04/23/2017 FINDINGS: Right total knee arthroplasty  changes noted. Components appear aligned. No complicating feature or osseous abnormality. Expected postop changes of the joint space and soft tissues. Anterior staples noted. IMPRESSION: Expected appearance status post right total knee arthroplasty. Electronically Signed   By: Jerilynn Mages.  Shick M.D.   On: 07/12/2017 11:24    Disposition: Discharge disposition: 01-Home or Self Care       Discharge Instructions    Call MD / Call 911   Complete by:  As directed    If you experience chest pain or shortness of breath, CALL 911 and be transported to the hospital emergency room.  If you develope a fever above 101.5 F, pus (white drainage) or increased drainage or redness at the wound, or calf pain, call your surgeon's office.   Constipation Prevention   Complete by:  As directed    Drink plenty of fluids.  Prune juice may be helpful.  You may use a stool softener, such as Colace (over the counter) 100 mg twice a day.  Use MiraLax (over the counter) for constipation as needed.   Driving restrictions   Complete by:  As directed    No driving while taking narcotic pain meds.   Increase activity slowly as tolerated   Complete by:  As directed          Signed: Aundra Dubin 07/14/2017, 9:19 AM

## 2017-07-14 NOTE — Care Management Note (Signed)
Case Management Note  Patient Details  Name: [name] MRN: [id number] Date of Birth: February 14, 1956  Subjective/Objective:           Pt presents for R TKA.   Pt from home with wife and has walker, etc. At home.  Pt has used multiple [place] agencies in the past.        Action/Plan: Pt chose [facility] to provide [facility] PT.  Referral called to [facility], liaison for [facility] [address]  Pt requests cane- order placed and requested from [facility].  Expected Discharge Date:  07/14/17               Expected Discharge Plan:  [place]  In-House Referral:  NA  Discharge planning Services  CM Consult  Post Acute Care Choice:  Durable Medical Equipment, Home Health Choice offered to:  Patient  DME Arranged:  Kasandra Knudsen DME Agency:  [place]:  PT [facility] Agency:  [facility] ([facility]  Status of Service:  Completed, signed off  If discussed at [employer] of Stay Meetings, dates discussed:    Additional Comments:  Claudie Leach, RN 07/14/2017, 11:15 AM

## 2017-07-14 NOTE — Progress Notes (Signed)
Physical Therapy Treatment Patient Details Name: [name] MRN: [id number] DOB: [birth year] Today's Date: 07/14/2017    History of Present Illness Pt is a 62 y/o male s/p elective R TKA. PMH includes glaucoma, HTN, ankle fx surgery.     PT Comments    Continuing work on functional mobility and activity tolerance;  Noting very good progress compared to yesterday; walking household distance, nice, stable knee in stance; initiated stair training -- will need reinforcement this afternoon; on track for dc today   Follow Up Recommendations  Follow surgeon's recommendation for DC plan and follow-up therapies;Supervision for mobility/OOB     Equipment Recommendations  None recommended by PT    Recommendations for Other Services       Precautions / Restrictions Precautions Precautions: Knee Precaution Booklet Issued: Yes (comment) Precaution Comments: Pt educated to not allow any pillow or bolster under knee for healing with optimal range of motion.  Required Braces or Orthoses: Knee Immobilizer - Right Restrictions RLE Weight Bearing: Weight bearing as tolerated    Mobility  Bed Mobility Overal bed mobility: Needs Assistance Bed Mobility: Sit to Supine     Supine to sit: Min guard     General bed mobility comments: Cues for techqnieu; slow moving, but did not need physical assist  Transfers Overall transfer level: Needs assistance Equipment used: Rolling walker (2 wheeled) Transfers: Sit to/from Stand Sit to Stand: Min guard         General transfer comment: Cues for hand placement and safety  Ambulation/Gait Ambulation/Gait assistance: Min guard Ambulation Distance (Feet): 100 Feet Assistive device: Rolling walker (2 wheeled) Gait Pattern/deviations: Step-through pattern(emerging) Gait velocity: slowed   General Gait Details: Cues for upright posture and to activate quad for stance stability   Stairs Stairs: Yes   Stair Management: No rails;With  walker;Forwards;Backwards;Step to pattern Number of Stairs: 1(x2) General stair comments: Cues for sequence, rW use, and technqiue; practiced forward technqiue and backward technqiue; needs reinforcement  Wheelchair Mobility    Modified Rankin (Stroke Patients Only)       Balance                                            Cognition Arousal/Alertness: Awake/alert Behavior During Therapy: WFL for tasks assessed/performed Overall Cognitive Status: Within Functional Limits for tasks assessed                                        Exercises Total Joint Exercises Quad Sets: AROM;Right;10 reps Straight Leg Raises: AAROM;Right;10 reps    General Comments        Pertinent Vitals/Pain Pain Assessment: 0-10 Pain Score: 4  Pain Location: R knee  Pain Descriptors / Indicators: Aching;Operative site guarding Pain Intervention(s): Monitored during session    Home Living                      Prior Function            PT Goals (current goals can now be found in the care plan section) Acute Rehab PT Goals Patient Stated Goal: to go home  PT Goal Formulation: With patient Time For Goal Achievement: 07/26/17 Potential to Achieve Goals: Good Progress towards PT goals: Progressing toward goals    Frequency    7X/week  PT Plan Current plan remains appropriate    Co-evaluation              AM-PAC PT "6 Clicks" Daily Activity  Outcome Measure  Difficulty turning over in bed (including adjusting bedclothes, sheets and blankets)?: A Lot Difficulty moving from lying on back to sitting on the side of the bed? : A Lot Difficulty sitting down on and standing up from a chair with arms (e.g., wheelchair, bedside commode, etc,.)?: A Little Help needed moving to and from a bed to chair (including a wheelchair)?: A Little Help needed walking in hospital room?: A Little Help needed climbing 3-5 steps with a railing? : A Little 6  Click Score: 16    End of Session Equipment Utilized During Treatment: Gait belt Activity Tolerance: Patient tolerated treatment well Patient left: in chair;with call bell/phone within reach Nurse Communication: Mobility status PT Visit Diagnosis: Other abnormalities of gait and mobility (R26.89);Pain Pain - Right/Left: Right Pain - part of body: Knee     Time: 4158-3094 PT Time Calculation (min) (ACUTE ONLY): 32 min  Charges:  $Gait Training: 23-37 mins                    G Codes:       Roney Marion, [place] Pager [phone] Office [place] 07/14/2017, 1:14 PM

## 2017-07-15 ENCOUNTER — Encounter (HOSPITAL_COMMUNITY): Payer: Self-pay | Admitting: Orthopaedic Surgery

## 2017-07-17 ENCOUNTER — Telehealth (INDEPENDENT_AMBULATORY_CARE_PROVIDER_SITE_OTHER): Payer: Self-pay | Admitting: Orthopaedic Surgery

## 2017-07-17 NOTE — Telephone Encounter (Signed)
[name] with [facility] request verbal orders for physical therapy 3 time a week for 2 weeks. Call back number [phone]

## 2017-07-17 NOTE — Telephone Encounter (Signed)
Ok for verbal orders ?

## 2017-07-17 NOTE — Telephone Encounter (Signed)
Ok thanks 

## 2017-07-17 NOTE — Telephone Encounter (Signed)
I called and gave verbal orders to [facility] with [facility].

## 2017-07-24 ENCOUNTER — Ambulatory Visit (INDEPENDENT_AMBULATORY_CARE_PROVIDER_SITE_OTHER): Payer: BLUE CROSS/BLUE SHIELD

## 2017-07-24 ENCOUNTER — Ambulatory Visit (INDEPENDENT_AMBULATORY_CARE_PROVIDER_SITE_OTHER): Payer: BLUE CROSS/BLUE SHIELD | Admitting: Orthopaedic Surgery

## 2017-07-24 ENCOUNTER — Encounter (INDEPENDENT_AMBULATORY_CARE_PROVIDER_SITE_OTHER): Payer: Self-pay | Admitting: Orthopaedic Surgery

## 2017-07-24 VITALS — BP 138/82 | HR 75

## 2017-07-24 DIAGNOSIS — M1711 Unilateral primary osteoarthritis, right knee: Secondary | ICD-10-CM

## 2017-07-24 DIAGNOSIS — Z96651 Presence of right artificial knee joint: Secondary | ICD-10-CM

## 2017-07-24 NOTE — Progress Notes (Signed)
   Post-Op Visit Note   Patient: [name]           Date of Birth: January 24, 1956           MRN: [id number] Visit Date: 07/24/2017 PCP: Chesley Noon, MD   Assessment & Plan: Post right total knee arthroplasty.  Prescription given for outpatient therapy that he can transition from home therapy.  Recheck 1 month.  Renewal of  Percocet prescribed.  Chief Complaint:  Chief Complaint  Patient presents with  . Right Knee - Pain, Routine Post Op   Visit Diagnoses:  1. Unilateral primary osteoarthritis, right knee   2. S/P total knee arthroplasty, right     Plan: ROV one month.   Follow-Up Instructions: No follow-ups on file.   Orders:  Orders Placed This Encounter  Procedures  . XR Knee 1-2 Views Right   No orders of the defined types were placed in this encounter.   Imaging: Xr Knee 1-2 Views Right  Result Date: 07/24/2017 AP lateral right knee obtained demonstrates well fixed position total knee arthroplasty. Impression: Satisfactory postop right total knee arthroplasty.   PMFS History: Patient Active Problem List   Diagnosis Date Noted  . Arthritis of right knee 07/12/2017  . Unilateral primary osteoarthritis, right knee 06/21/2017   Past Medical History:  Diagnosis Date  . Acid reflux   . Arthritis   . Depression   . Glaucoma   . Hypertension     Family History  Problem Relation Age of Onset  . Bladder Cancer Mother   . Coronary artery disease Mother   . Heart disease Mother   . Stroke Father   . Alcoholism Father     Past Surgical History:  Procedure Laterality Date  . ANKLE FRACTURE SURGERY     states broken leg not ankle  . COLONOSCOPY    . KNEE SURGERY    . TOTAL KNEE ARTHROPLASTY Right 07/12/2017   Procedure: RIGHT TOTAL KNEE ARTHROPLASTY -CEMENTED;  Surgeon: Marybelle Killings, MD;  Location: [facility];  Service: Orthopedics;  Laterality: Right;   Social History   Occupational History  . Not on file  Tobacco Use  . Smoking status: Former  Smoker    Last attempt to quit: 12/19/1999    Years since quitting: 17.6  . Smokeless tobacco: Never Used  Substance and Sexual Activity  . Alcohol use: Yes    Frequency: Never    Comment: 7-14 day  . Drug use: No  . Sexual activity: Not on file

## 2017-07-25 ENCOUNTER — Other Ambulatory Visit (INDEPENDENT_AMBULATORY_CARE_PROVIDER_SITE_OTHER): Payer: Self-pay

## 2017-07-25 DIAGNOSIS — M1711 Unilateral primary osteoarthritis, right knee: Secondary | ICD-10-CM

## 2017-07-25 DIAGNOSIS — Z96651 Presence of right artificial knee joint: Secondary | ICD-10-CM

## 2017-07-29 ENCOUNTER — Other Ambulatory Visit: Payer: Self-pay

## 2017-07-29 ENCOUNTER — Ambulatory Visit: Payer: BLUE CROSS/BLUE SHIELD | Attending: Orthopaedic Surgery

## 2017-07-29 DIAGNOSIS — M25661 Stiffness of right knee, not elsewhere classified: Secondary | ICD-10-CM | POA: Diagnosis present

## 2017-07-29 DIAGNOSIS — M25561 Pain in right knee: Secondary | ICD-10-CM | POA: Diagnosis present

## 2017-07-29 DIAGNOSIS — M6281 Muscle weakness (generalized): Secondary | ICD-10-CM

## 2017-07-29 DIAGNOSIS — R2689 Other abnormalities of gait and mobility: Secondary | ICD-10-CM

## 2017-07-29 DIAGNOSIS — R600 Localized edema: Secondary | ICD-10-CM

## 2017-07-29 NOTE — Patient Instructions (Signed)
Access Code: [email or web address]  URL: [email or web address]  Date: 07/29/2017  Prepared by: Sigurd Sos   Exercises  Supine Knee Extension Stretch on Towel Roll - 5 Reps - 2 Sets - 10 Hold - 3x daily - 7x weekly  Supine Heel Slide with Strap - 10 Reps - 2 Sets - 10 Hold - 2x daily - 7x weekly  Seated Ankle Dorsiflexion Stretch - 10 reps - 2 sets - 20 hold - 1x daily - 7x weekly  Seated Long Arc Quad - 10 Reps - 2 Sets - 5 Hold - 1x daily - 7x weekly  Supine Quad Set - 10 reps - 2 sets - 5 hold - 3x daily - 7x weekly

## 2017-07-29 NOTE — Therapy (Signed)
[facility] [address] [place], [place], [address] Phone: [phone]   Fax:  [phone]  Physical Therapy Evaluation  Patient Details  Name: [name] MRN: [id number] Date of Birth: [birth year] Referring Provider: Rodell Perna, MD   Encounter Date: 07/29/2017  PT End of Session - 07/29/17 1015    Visit Number  1    Date for PT Re-Evaluation  09/23/17    Authorization Type  [employer]    PT Start Time  0935    PT Stop Time  1027    PT Time Calculation (min)  52 min    Activity Tolerance  Patient tolerated treatment well    Behavior During Therapy  [facility] for tasks assessed/performed       Past Medical History:  Diagnosis Date  . Acid reflux   . Arthritis   . Depression   . Glaucoma   . Hypertension     Past Surgical History:  Procedure Laterality Date  . ANKLE FRACTURE SURGERY     states broken leg not ankle  . COLONOSCOPY    . KNEE SURGERY    . TOTAL KNEE ARTHROPLASTY Right 07/12/2017   Procedure: RIGHT TOTAL KNEE ARTHROPLASTY -CEMENTED;  Surgeon: Marybelle Killings, MD;  Location: [facility];  Service: Orthopedics;  Laterality: Right;    There were no vitals filed for this visit.   Subjective Assessment - 07/29/17 0945    Subjective  Pt presents to PT s/p Rt TKA due to OA.      Pertinent History  Rt TKA: 07/12/17    Limitations  Standing;Walking    How long can you stand comfortably?  Lt>Rt weightbearing.  Using walker    How long can you walk comfortably?  unknow    Patient Stated Goals  wean from walker, improve Rt knee strength and flexibility, return to prior level of funtion    Currently in Pain?  Yes    Pain Score  3     Pain Location  Knee    Pain Orientation  Right    Pain Descriptors / Indicators  Tightness;Numbness;Sore    Pain Type  Surgical pain    Pain Onset  More than a month ago    Pain Frequency  Constant    Aggravating Factors   standing, walking, bending the knee    Pain Relieving  Factors  ice, rest, pain medication         [facility] PT Assessment - 07/29/17 0001      Assessment   Medical Diagnosis  Rt knee OA, s/p TKA    Referring Provider  Rodell Perna, MD    Onset Date/Surgical Date  07/12/17    Next MD Visit  08/2017    Prior Therapy  home health PT x 2 weeks      Precautions   Precautions  None      Restrictions   Weight Bearing Restrictions  No      Balance Screen   Has the patient fallen in the past 6 months  No    Has the patient had a decrease in activity level because of a fear of falling?   No    Is the patient reluctant to leave their home because of a fear of falling?   No      Home Environment   Living Environment  Private residence    Living Arrangements  Spouse/significant other    Type of [place]  Access  Stairs to enter    Entrance Stairs-Number of Steps  1    Home Layout  One level    [place] - 2 wheels;Kasandra Knudsen - single point      Prior Function   Level of Independence  Independent    Vocation  Retired    Leisure  yard work, Teacher, English as a foreign language Status  Within [employer] for tasks assessed      Observation/Other Assessments   Focus on Therapeutic Outcomes (FOTO)   61% limitation      Observation/Other Assessments-Edema    Edema  Circumferential      Circumferential Edema   Circumferential - Right  17 inches    Circumferential - Left   15.5 inches      Posture/Postural Control   Posture/Postural Control  No significant limitations      ROM / Strength   AROM / PROM / Strength  AROM;PROM;Strength      AROM   Overall AROM   Deficits    Overall AROM Comments  Lt knee A/ROM is full    AROM Assessment Site  Knee    Right/Left Knee  Right    Right Knee Extension  15    Right Knee Flexion  80      PROM   Overall PROM   Deficits    PROM Assessment Site  Knee    Right/Left Knee  Right    Right Knee Extension  12    Right Knee Flexion  83      Strength   Overall  Strength  Deficits    Overall Strength Comments  Rt hip 4/5, Lt hip 4+/5    Strength Assessment Site  Knee;Ankle    Right/Left Knee  Right;Left    Right Knee Flexion  4-/5    Right Knee Extension  4/5    Left Knee Flexion  4+/5    Left Knee Extension  4+/5    Right/Left Ankle  Right;Left    Right Ankle Dorsiflexion  4+/5    Left Ankle Dorsiflexion  4+/5      Palpation   Patella mobility  reduced patellar mobility in all directions    Palpation comment  8 inch surgical incision with steristrips present      Transfers   Transfers  Sit to Stand;Stand to Sit    Sit to Stand  With upper extremity assist;6: Modified independent (Device/Increase time)    Stand to Sit  6: Modified independent (Device/Increase time);With upper extremity assist      Ambulation/Gait   Ambulation/Gait  Yes    Ambulation/Gait Assistance  6: Modified independent (Device/Increase time)    Assistive device  Rolling walker    Gait Pattern  Step-to pattern;Decreased stride length;Decreased stance time - right;Decreased step length - right;Decreased hip/knee flexion - right                Objective measurements completed on examination: See above findings.      [facility] Adult PT Treatment/Exercise - 07/29/17 0001      Exercises   Exercises  Knee/Hip      Knee/Hip Exercises: Aerobic   Recumbent Bike  Level 0 x 6 minutes for A/ROM      Modalities   Modalities  Vasopneumatic      Vasopneumatic   Number Minutes Vasopneumatic   15 minutes    Vasopnuematic Location   Knee    Vasopneumatic Pressure  Medium  Vasopneumatic Temperature   3 snowflakes             PT Education - 07/29/17 1041    Education provided  Yes    Education Details  access code: [email or web address]     Person(s) Educated  Patient    Methods  Explanation    Comprehension  Verbalized understanding;Need further instruction;Returned demonstration       PT Short Term Goals - 07/29/17 0948      PT SHORT TERM GOAL #1   Title  be  independent in initial HEP    Time  4    Period  Weeks    Status  New    Target Date  08/26/17      PT SHORT TERM GOAL #2   Title  demonstrate Rt knee A/ROM extension to lacking < or = to 10 degrees to normalize gait pattern    Time  4    Period  Weeks    Status  New    Target Date  08/26/17      PT SHORT TERM GOAL #3   Title  demonstrate Rt knee A/ROM flexion to > or = to 95 degrees to allow for sitting and walking without substitution    Time  4    Period  Weeks    Status  New    Target Date  08/26/17      PT SHORT TERM GOAL #4   Title  reduce Rt knee pain to allow for walking > 10 minutes without limitation    Time  4    Period  Weeks    Status  New    Target Date  08/26/17        PT Long Term Goals - 07/29/17 1106      PT LONG TERM GOAL #1   Title  be independent in advanced HEP    Time  8    Period  Weeks    Status  New    Target Date  09/23/17      PT LONG TERM GOAL #2   Title  reduce FOTO to < or = to 40% limitation    Time  8    Period  Weeks    Status  New    Target Date  09/23/17      PT LONG TERM GOAL #3   Title  improve Rt knee A/ROM extension to lacking < or = to 5 degrees to normalize gait pattern    Time  8    Period  Weeks    Status  New    Target Date  09/23/17      PT LONG TERM GOAL #4   Title  improve Rt knee and hip strength to wean from walker to use of cane for all distances with safety    Time  8    Period  Weeks    Status  New    Target Date  09/23/17      PT LONG TERM GOAL #5   Title  demonstrate > or = to 120 degrees of Rt knee flexion to improve mobility, gait and squatting    Time  8    Period  Weeks    Status  New    Target Date  09/23/17      Additional Long Term Goals   Additional Long Term Goals  Yes      PT LONG TERM GOAL #6   Title  improve strength and reduce Rt  knee pain to allow for walking 30 minutes without limitation to improve independence in the community    Time  8    Period  Weeks    Status  New     Target Date  09/23/17             Plan - 07/29/17 1059    Clinical Impression Statement  Pt presents to PT 3 weeks s/p Rt TKA.  Pt had home health PT unitl 07/26/17.  Pt demonstrates limited Rt knee A/ROM and P/ROM, Rt hip, knee and ankle strength, edema and gait abnormality.  Pt will benefit from skilled PT services to improve Rt knee A/ROM, scar mobility, LE strength and gait to improve safety and endurance at home and in the community to allow for return to prior level of mobility and function.      History and Personal Factors relevant to plan of care:  Rt TKA 07/2017    Clinical Presentation  Stable    Clinical Presentation due to:  expected progress s/p TKA, lives with wife, independent    Clinical Decision Making  Low    Rehab Potential  Excellent    PT Frequency  3x / week    PT Duration  8 weeks    PT Treatment/Interventions  ADLs/Self Care Home Management;Cryotherapy;Electrical Stimulation;Moist Heat;Gait training;Stair training;Functional mobility training;Therapeutic activities;Therapeutic exercise;Balance training;Patient/family education;Neuromuscular re-education;Manual techniques;Passive range of motion;Taping;Vasopneumatic Device    PT Next Visit Plan  Rt knee ROM progression, edema management, soft tissue to distal hamstrings and proximal gastroc, gait training, Rt hip and knee strength    PT Home Exercise Plan  access code:  [email or web address]     Consulted and Agree with Plan of Care  Patient       Patient will benefit from skilled therapeutic intervention in order to improve the following deficits and impairments:  Pain, Abnormal gait, Decreased mobility, Decreased activity tolerance, Decreased endurance, Decreased range of motion, Decreased strength, Impaired flexibility, Increased edema, Difficulty walking  Visit Diagnosis: Acute pain of right knee - Plan: PT plan of care cert/re-cert  Localized edema - Plan: PT plan of care cert/re-cert  Muscle weakness (generalized) -  Plan: PT plan of care cert/re-cert  Other abnormalities of gait and mobility - Plan: PT plan of care cert/re-cert  Stiffness of right knee, not elsewhere classified - Plan: PT plan of care cert/re-cert     Problem List Patient Active Problem List   Diagnosis Date Noted  . Arthritis of right knee 07/12/2017  . Unilateral primary osteoarthritis, right knee 06/21/2017    [name], PT 07/29/17 11:12 AM  [facility] [address], [place], [place], [address] Phone: [phone]   Fax:  [phone]  Name: [name] MRN: [id number] Date of Birth: Sep 08, 1955

## 2017-07-31 ENCOUNTER — Ambulatory Visit: Payer: BLUE CROSS/BLUE SHIELD

## 2017-07-31 DIAGNOSIS — M25561 Pain in right knee: Secondary | ICD-10-CM

## 2017-07-31 DIAGNOSIS — R600 Localized edema: Secondary | ICD-10-CM

## 2017-07-31 DIAGNOSIS — R2689 Other abnormalities of gait and mobility: Secondary | ICD-10-CM

## 2017-07-31 DIAGNOSIS — M6281 Muscle weakness (generalized): Secondary | ICD-10-CM

## 2017-07-31 DIAGNOSIS — M25661 Stiffness of right knee, not elsewhere classified: Secondary | ICD-10-CM

## 2017-07-31 NOTE — Therapy (Signed)
[facility] [address], [place], [place], [address] Phone: [phone]   Fax:  [phone]  Physical Therapy Treatment  Patient Details  Name: [name] MRN: [id number] Date of Birth: [birth year] Referring Provider: Rodell Perna, MD   Encounter Date: 07/31/2017  PT End of Session - 07/31/17 1011    Visit Number  2    Date for PT Re-Evaluation  09/23/17    Authorization Type  BCBS    PT Start Time  0928    PT Stop Time  1028    PT Time Calculation (min)  60 min    Activity Tolerance  Patient tolerated treatment well    Behavior During Therapy  [facility] for tasks assessed/performed       Past Medical History:  Diagnosis Date  . Acid reflux   . Arthritis   . Depression   . Glaucoma   . Hypertension     Past Surgical History:  Procedure Laterality Date  . ANKLE FRACTURE SURGERY     states broken leg not ankle  . COLONOSCOPY    . KNEE SURGERY    . TOTAL KNEE ARTHROPLASTY Right 07/12/2017   Procedure: RIGHT TOTAL KNEE ARTHROPLASTY -CEMENTED;  Surgeon: Marybelle Killings, MD;  Location: [place];  Service: Orthopedics;  Laterality: Right;    There were no vitals filed for this visit.  Subjective Assessment - 07/31/17 0936    Subjective  My Rt knee is limbering up.      Currently in Pain?  Yes    Pain Score  3     Pain Location  Knee    Pain Orientation  Right    Pain Descriptors / Indicators  Tightness;Numbness;Sore    Pain Type  Surgical pain                       [facility] Adult PT Treatment/Exercise - 07/31/17 0001      Knee/Hip Exercises: Aerobic   Recumbent Bike  --    Nustep  Level 1x 8 minutes PT provided verbal cues for quad activation      Knee/Hip Exercises: Standing   Rebounder  weight shifting 3 ways 3x1 minute      Knee/Hip Exercises: Seated   Long Arc Quad  Right;Strengthening;AROM;2 sets;10 reps    Hamstring Curl  Strengthening;Right;2 sets;10 reps;Other (comment)    Hamstring Limitations  red band    Sit to Sand  20 reps;without UE support      Knee/Hip Exercises: Supine   Quad Sets  Right;2 sets;10 reps    Heel Slides  AAROM;Right;10 reps    Straight Leg Raises  Strengthening;Right;2 sets;10 reps      Modalities   Modalities  Vasopneumatic      Vasopneumatic   Number Minutes Vasopneumatic   15 minutes    Vasopnuematic Location   Knee    Vasopneumatic Pressure  Medium    Vasopneumatic Temperature   3 snowflakes      Manual Therapy   Manual Therapy  Myofascial release;Soft tissue mobilization    Manual therapy comments  soft tissue elongation to Rt distal quads and hamstrings and PROM into flexion and extension.  Patellar mobs               PT Short Term Goals - 07/29/17 0948      PT SHORT TERM GOAL #1   Title  be independent in initial HEP    Time  4  Period  Weeks    Status  New    Target Date  08/26/17      PT SHORT TERM GOAL #2   Title  demonstrate Rt knee A/ROM extension to lacking < or = to 10 degrees to normalize gait pattern    Time  4    Period  Weeks    Status  New    Target Date  08/26/17      PT SHORT TERM GOAL #3   Title  demonstrate Rt knee A/ROM flexion to > or = to 95 degrees to allow for sitting and walking without substitution    Time  4    Period  Weeks    Status  New    Target Date  08/26/17      PT SHORT TERM GOAL #4   Title  reduce Rt knee pain to allow for walking > 10 minutes without limitation    Time  4    Period  Weeks    Status  New    Target Date  08/26/17        PT Long Term Goals - 07/29/17 1106      PT LONG TERM GOAL #1   Title  be independent in advanced HEP    Time  8    Period  Weeks    Status  New    Target Date  09/23/17      PT LONG TERM GOAL #2   Title  reduce FOTO to < or = to 40% limitation    Time  8    Period  Weeks    Status  New    Target Date  09/23/17      PT LONG TERM GOAL #3   Title  improve Rt knee A/ROM extension to lacking < or = to 5 degrees to  normalize gait pattern    Time  8    Period  Weeks    Status  New    Target Date  09/23/17      PT LONG TERM GOAL #4   Title  improve Rt knee and hip strength to wean from walker to use of cane for all distances with safety    Time  8    Period  Weeks    Status  New    Target Date  09/23/17      PT LONG TERM GOAL #5   Title  demonstrate > or = to 120 degrees of Rt knee flexion to improve mobility, gait and squatting    Time  8    Period  Weeks    Status  New    Target Date  09/23/17      Additional Long Term Goals   Additional Long Term Goals  Yes      PT LONG TERM GOAL #6   Title  improve strength and reduce Rt knee pain to allow for walking 30 minutes without limitation to improve independence in the community    Time  8    Period  Weeks    Status  New    Target Date  09/23/17            Plan - 07/31/17 0958    Clinical Impression Statement  Pt with 1st session after evaluation.  Pt is independent and compliant with HEP for strength and flexibility.  Pt with improved mobility today with reduced UE support required for transfers and improved weight bearing into the Rt LE.  Pt  requires supervision and verbal cueing for technique for quad activation with exercise.  Pt will continue to benefit from skilled PT s/p TKA for edema management, gait training, a/ROM and strength of Lt knee.      Rehab Potential  Excellent    PT Frequency  3x / week    PT Duration  8 weeks    PT Treatment/Interventions  ADLs/Self Care Home Management;Cryotherapy;Electrical Stimulation;Moist Heat;Gait training;Stair training;Functional mobility training;Therapeutic activities;Therapeutic exercise;Balance training;Patient/family education;Neuromuscular re-education;Manual techniques;Passive range of motion;Taping;Vasopneumatic Device    PT Next Visit Plan  Rt knee ROM progression, edema management, soft tissue to distal hamstrings and proximal gastroc, gait training, Rt hip and knee strength     Consulted and Agree with Plan of Care  Patient       Patient will benefit from skilled therapeutic intervention in order to improve the following deficits and impairments:  Pain, Abnormal gait, Decreased mobility, Decreased activity tolerance, Decreased endurance, Decreased range of motion, Decreased strength, Impaired flexibility, Increased edema, Difficulty walking  Visit Diagnosis: Acute pain of right knee  Localized edema  Muscle weakness (generalized)  Stiffness of right knee, not elsewhere classified  Other abnormalities of gait and mobility     Problem List Patient Active Problem List   Diagnosis Date Noted  . Arthritis of right knee 07/12/2017  . Unilateral primary osteoarthritis, right knee 06/21/2017     Sigurd Sos, PT 07/31/17 10:13 AM  [facility] [address], [place], [place], [address] Phone: [phone]   Fax:  [phone]  Name: [name] MRN: [id number] Date of Birth: 11-26-1955

## 2017-08-02 ENCOUNTER — Ambulatory Visit: Payer: BLUE CROSS/BLUE SHIELD | Admitting: Physical Therapy

## 2017-08-02 ENCOUNTER — Encounter: Payer: Self-pay | Admitting: Physical Therapy

## 2017-08-02 DIAGNOSIS — R600 Localized edema: Secondary | ICD-10-CM

## 2017-08-02 DIAGNOSIS — M25561 Pain in right knee: Secondary | ICD-10-CM

## 2017-08-02 DIAGNOSIS — R2689 Other abnormalities of gait and mobility: Secondary | ICD-10-CM

## 2017-08-02 DIAGNOSIS — M25661 Stiffness of right knee, not elsewhere classified: Secondary | ICD-10-CM

## 2017-08-02 DIAGNOSIS — M6281 Muscle weakness (generalized): Secondary | ICD-10-CM

## 2017-08-02 NOTE — Patient Instructions (Signed)
Knee Stretch    Cross legs at ankles. With front leg, push other leg until stretch is felt. Relax. Recross legs at ankles. Lift front leg with back leg, straightening it. Repeat entire exercise with legs switched. Repeat __10__ times. Hold 10 seconds or longer if you can relax.  Do _3___ sessions per day. The goal is not harder! Smooth & slow, work on holding the stretch longer.  http://gt2.exer.us/263   Copyright  VHI. All rights reserved.  Hamstring: Towel Stretch (Supine)    Lie on back. Loop towel around left foot, hip and knee at 90. Straighten knee and pull foot toward body. Hold __30 _ seconds. Relax. Repeat _3__ times. Do _3__ times a day. Repeat with other leg. Make sure you bend the left knee for your back.     Copyright  VHI. All rights reserved.  Hamstring Stretch (Sitting)    Sitting, extend one leg and place hands on same thigh for support. Keeping torso straight, lean forward, sliding hands down leg, until a stretch is felt in back of thigh. Hold __10-20__ seconds. Do 3x, BREATHE!!!   Copyright  VHI. All rights reserved.

## 2017-08-02 NOTE — Therapy (Signed)
[facility] [address] [place], [place], [address] Phone: [phone]   Fax:  [phone]  Physical Therapy Treatment  Patient Details  Name: [name] MRN: [id number] Date of Birth: September 20, 1955 Referring Provider: Rodell Perna, MD   Encounter Date: 08/02/2017  PT End of Session - 08/02/17 1025    Visit Number  3    Date for PT Re-Evaluation  09/23/17    Authorization Type  BCBS    PT Start Time  1021    PT Stop Time  1120    PT Time Calculation (min)  59 min    Activity Tolerance  Patient tolerated treatment well    Behavior During Therapy  [facility] for tasks assessed/performed       Past Medical History:  Diagnosis Date  . Acid reflux   . Arthritis   . Depression   . Glaucoma   . Hypertension     Past Surgical History:  Procedure Laterality Date  . ANKLE FRACTURE SURGERY     states broken leg not ankle  . COLONOSCOPY    . KNEE SURGERY    . TOTAL KNEE ARTHROPLASTY Right 07/12/2017   Procedure: RIGHT TOTAL KNEE ARTHROPLASTY -CEMENTED;  Surgeon: Marybelle Killings, MD;  Location: [place];  Service: Orthopedics;  Laterality: Right;    There were no vitals filed for this visit.  Subjective Assessment - 08/02/17 1026    Subjective  I had a rough night. My knee is tight and sore today.     Pertinent History  Rt TKA: 07/12/17    Limitations  Standing;Walking    How long can you stand comfortably?  Lt>Rt weightbearing.  Using walker    How long can you walk comfortably?  unknow    Patient Stated Goals  wean from walker, improve Rt knee strength and flexibility, return to prior level of funtion    Currently in Pain?  Yes    Pain Score  5     Pain Location  Knee    Pain Orientation  Right    Pain Descriptors / Indicators  Tightness;Sore;Throbbing    Multiple Pain Sites  No         [facility] PT Assessment - 08/02/17 0001      AROM   Right Knee Flexion  85                   [facility] Adult PT  Treatment/Exercise - 08/02/17 0001      Ambulation/Gait   Ambulation/Gait  Yes    Ambulation/Gait Assistance  4: Min assist    Ambulation Distance (Feet)  5 Feet    Assistive device  Straight cane    Gait Pattern  Step-to pattern    Gait Comments  Pt not stable to RTLE for cane      Knee/Hip Exercises: Stretches   Active Hamstring Stretch  Right;3 reps;20 seconds    Gastroc Stretch  Right;3 reps;20 seconds      Knee/Hip Exercises: Aerobic   Nustep  L1 x 8 min PTA present for status      Knee/Hip Exercises: Standing   Rebounder  weight shifting 3 ways 3x1 minute      Knee/Hip Exercises: Seated   Ball Squeeze  10x 5 sec hold      Vasopneumatic   Number Minutes Vasopneumatic   15 minutes    Vasopnuematic Location   Knee    Vasopneumatic Pressure  Medium    Vasopneumatic Temperature  3 snowflakes      Manual Therapy   Manual Therapy  Myofascial release;Soft tissue mobilization    Manual therapy comments  soft tissue elongation to Rt distal quads and hamstrings and PROM into flexion and extension.  Patellar mobs Instrument asst for increased gliding of tissues quads,calf             PT Education - 08/02/17 1103    Education provided  Yes    Education Details  Emergency planning/management officer) Educated  Patient    Methods  Explanation;Demonstration;Verbal cues;Handout    Comprehension  Verbalized understanding;Returned demonstration       PT Short Term Goals - 07/29/17 0948      PT SHORT TERM GOAL #1   Title  be independent in initial HEP    Time  4    Period  Weeks    Status  New    Target Date  08/26/17      PT SHORT TERM GOAL #2   Title  demonstrate Rt knee A/ROM extension to lacking < or = to 10 degrees to normalize gait pattern    Time  4    Period  Weeks    Status  New    Target Date  08/26/17      PT SHORT TERM GOAL #3   Title  demonstrate Rt knee A/ROM flexion to > or = to 95 degrees to allow for sitting and walking without substitution     Time  4    Period  Weeks    Status  New    Target Date  08/26/17      PT SHORT TERM GOAL #4   Title  reduce Rt knee pain to allow for walking > 10 minutes without limitation    Time  4    Period  Weeks    Status  New    Target Date  08/26/17        PT Long Term Goals - 07/29/17 1106      PT LONG TERM GOAL #1   Title  be independent in advanced HEP    Time  8    Period  Weeks    Status  New    Target Date  09/23/17      PT LONG TERM GOAL #2   Title  reduce FOTO to < or = to 40% limitation    Time  8    Period  Weeks    Status  New    Target Date  09/23/17      PT LONG TERM GOAL #3   Title  improve Rt knee A/ROM extension to lacking < or = to 5 degrees to normalize gait pattern    Time  8    Period  Weeks    Status  New    Target Date  09/23/17      PT LONG TERM GOAL #4   Title  improve Rt knee and hip strength to wean from walker to use of cane for all distances with safety    Time  8    Period  Weeks    Status  New    Target Date  09/23/17      PT LONG TERM GOAL #5   Title  demonstrate > or = to 120 degrees of Rt knee flexion to improve mobility, gait and squatting    Time  8    Period  Weeks    Status  New  Target Date  09/23/17      Additional Long Term Goals   Additional Long Term Goals  Yes      PT LONG TERM GOAL #6   Title  improve strength and reduce Rt knee pain to allow for walking 30 minutes without limitation to improve independence in the community    Time  8    Period  Weeks    Status  New    Target Date  09/23/17            Plan - 08/02/17 1025    Clinical Impression Statement  Pt presents today with stiff/sore knee today. Did not have a good night he reports.  Manual techniques loosened up his soft tissues and gave pt much relief.  pt has difficulty activating quad when walking with the cane. He took only a few steps today with the cane reporting his knee does not feel stable and the muscle doesn't feel like it is going to hold  him. pt measured 85 degrees flexion at end of session which is 5 degrees more than on eval.      Rehab Potential  Excellent    PT Frequency  3x / week    PT Duration  8 weeks    PT Treatment/Interventions  ADLs/Self Care Home Management;Cryotherapy;Electrical Stimulation;Moist Heat;Gait training;Stair training;Functional mobility training;Therapeutic activities;Therapeutic exercise;Balance training;Patient/family education;Neuromuscular re-education;Manual techniques;Passive range of motion;Taping;Vasopneumatic Device    PT Next Visit Plan  Rt knee ROM progression, edema management, soft tissue to distal hamstrings and proximal gastroc, gait training, Rt hip and knee strength    PT Home Exercise Plan  access code:  [email or web address]        Patient will benefit from skilled therapeutic intervention in order to improve the following deficits and impairments:  Pain, Abnormal gait, Decreased mobility, Decreased activity tolerance, Decreased endurance, Decreased range of motion, Decreased strength, Impaired flexibility, Increased edema, Difficulty walking  Visit Diagnosis: Acute pain of right knee  Localized edema  Muscle weakness (generalized)  Stiffness of right knee, not elsewhere classified  Other abnormalities of gait and mobility     Problem List Patient Active Problem List   Diagnosis Date Noted  . Arthritis of right knee 07/12/2017  . Unilateral primary osteoarthritis, right knee 06/21/2017    COCHRAN,JENNIFER, PTA 08/02/2017, 11:56 AM  [facility] [address] [place], [place], [address] Phone: [phone]   Fax:  [phone]  Name: BRET VANESSEN MRN: [id number] Date of Birth: 23-Jul-1955

## 2017-08-05 ENCOUNTER — Ambulatory Visit: Payer: BLUE CROSS/BLUE SHIELD | Admitting: Physical Therapy

## 2017-08-05 ENCOUNTER — Encounter: Payer: Self-pay | Admitting: Physical Therapy

## 2017-08-05 DIAGNOSIS — M25561 Pain in right knee: Secondary | ICD-10-CM

## 2017-08-05 DIAGNOSIS — R2689 Other abnormalities of gait and mobility: Secondary | ICD-10-CM

## 2017-08-05 DIAGNOSIS — M6281 Muscle weakness (generalized): Secondary | ICD-10-CM

## 2017-08-05 DIAGNOSIS — M25661 Stiffness of right knee, not elsewhere classified: Secondary | ICD-10-CM

## 2017-08-05 DIAGNOSIS — R600 Localized edema: Secondary | ICD-10-CM

## 2017-08-05 NOTE — Therapy (Signed)
[facility] [address], [place], [place], [address] Phone: [phone]   Fax:  [phone]  Physical Therapy Treatment  Patient Details  Name: [name] MRN: [id number] Date of Birth: [birth year] Referring Provider: Rodell Perna, MD   Encounter Date: 08/05/2017  PT End of Session - 08/05/17 0846    Visit Number  4    Date for PT Re-Evaluation  09/23/17    Authorization Type  BCBS    PT Start Time  0845    PT Stop Time  0950    PT Time Calculation (min)  65 min    Activity Tolerance  Patient tolerated treatment well    Behavior During Therapy  [facility] for tasks assessed/performed       Past Medical History:  Diagnosis Date  . Acid reflux   . Arthritis   . Depression   . Glaucoma   . Hypertension     Past Surgical History:  Procedure Laterality Date  . ANKLE FRACTURE SURGERY     states broken leg not ankle  . COLONOSCOPY    . KNEE SURGERY    . TOTAL KNEE ARTHROPLASTY Right 07/12/2017   Procedure: RIGHT TOTAL KNEE ARTHROPLASTY -CEMENTED;  Surgeon: Marybelle Killings, MD;  Location: [place];  Service: Orthopedics;  Laterality: Right;    There were no vitals filed for this visit.  Subjective Assessment - 08/05/17 0847    Subjective  I felt good after last session and held for 2 days. Sunday evening it started hurting again and had another rough night.     Pertinent History  Rt TKA: 07/12/17    Limitations  Standing;Walking    How long can you stand comfortably?  Lt>Rt weightbearing.  Using walker    Currently in Pain?  Yes    Pain Score  2     Pain Location  Knee    Pain Orientation  Right    Pain Descriptors / Indicators  Tightness    Aggravating Factors   night time    Pain Relieving Factors  ice, meds    Multiple Pain Sites  No         [facility] PT Assessment - 08/05/17 0001      AROM   Right Knee Flexion  88                   [facility] Adult PT Treatment/Exercise - 08/05/17 0001      Ambulation/Gait   Ambulation/Gait  Yes    Ambulation Distance (Feet)  100 Feet    Assistive device  Rolling walker    Gait Pattern  Decreased hip/knee flexion - right;Decreased dorsiflexion - right;Right foot flat    Gait Comments  VC for increased knee and ankle flexion with RW      Knee/Hip Exercises: Stretches   Active Hamstring Stretch  Right;3 reps;20 seconds    Gastroc Stretch  Right;3 reps;20 seconds On slant board, VC for corretc position      Knee/Hip Exercises: Aerobic   Nustep  L1 x 8 min PTA present for status      Knee/Hip Exercises: Standing   Rebounder  weight shifting 3 ways 3x1 minute      Knee/Hip Exercises: Seated   Long Arc Quad  AROM;Strengthening;Right;2 sets;10 reps;Weights    Long Arc Quad Weight  2 lbs. VC for knee ext without hip flexion    Ball Squeeze  20x 5 sec hold      Vasopneumatic  Number Minutes Vasopneumatic   15 minutes    Vasopnuematic Location   Knee    Vasopneumatic Pressure  Medium    Vasopneumatic Temperature   3 snowflakes      Manual Therapy   Manual Therapy  Myofascial release;Soft tissue mobilization    Manual therapy comments  soft tissue elongation to Rt distal quads and hamstrings and PROM into flexion and extension.  Patellar mobs Instrument asst for increased gliding of tissues quads,calf             PT Education - 08/05/17 0922    Education provided  No       PT Short Term Goals - 07/29/17 0948      PT SHORT TERM GOAL #1   Title  be independent in initial HEP    Time  4    Period  Weeks    Status  New    Target Date  08/26/17      PT SHORT TERM GOAL #2   Title  demonstrate Rt knee A/ROM extension to lacking < or = to 10 degrees to normalize gait pattern    Time  4    Period  Weeks    Status  New    Target Date  08/26/17      PT SHORT TERM GOAL #3   Title  demonstrate Rt knee A/ROM flexion to > or = to 95 degrees to allow for sitting and walking without substitution    Time  4    Period  Weeks     Status  New    Target Date  08/26/17      PT SHORT TERM GOAL #4   Title  reduce Rt knee pain to allow for walking > 10 minutes without limitation    Time  4    Period  Weeks    Status  New    Target Date  08/26/17        PT Long Term Goals - 07/29/17 1106      PT LONG TERM GOAL #1   Title  be independent in advanced HEP    Time  8    Period  Weeks    Status  New    Target Date  09/23/17      PT LONG TERM GOAL #2   Title  reduce FOTO to < or = to 40% limitation    Time  8    Period  Weeks    Status  New    Target Date  09/23/17      PT LONG TERM GOAL #3   Title  improve Rt knee A/ROM extension to lacking < or = to 5 degrees to normalize gait pattern    Time  8    Period  Weeks    Status  New    Target Date  09/23/17      PT LONG TERM GOAL #4   Title  improve Rt knee and hip strength to wean from walker to use of cane for all distances with safety    Time  8    Period  Weeks    Status  New    Target Date  09/23/17      PT LONG TERM GOAL #5   Title  demonstrate > or = to 120 degrees of Rt knee flexion to improve mobility, gait and squatting    Time  8    Period  Weeks    Status  New  Target Date  09/23/17      Additional Long Term Goals   Additional Long Term Goals  Yes      PT LONG TERM GOAL #6   Title  improve strength and reduce Rt knee pain to allow for walking 30 minutes without limitation to improve independence in the community    Time  8    Period  Weeks    Status  New    Target Date  09/23/17            Plan - 08/05/17 0846    Clinical Impression Statement  Sterio strips now off, some thickening to middle of scar present today. Knee soft tissues remain tight, worked on this via Psychologist, forensic. Pt reports he is afraid to walk with his cane. PTA asked if he would bring his quad cane on his next visit so we could work with what he is comfortable. Pt's soft tissues continue to improve by evidence of his knee bending more, albeit slow.  Swelling remains which also limits his bend/knee flexion. Pt did bend to 88 degrees today at end of session. It probably was closer to 90 degrees as his foot was sliding on the mat table. Pt needed continual verbal cures to walk with neutral hip and lower leg as his tends to walk with his hip externally rotated including his ankle. This seems more habitual and was asked to work on this until his next session.     Rehab Potential  Excellent    PT Frequency  3x / week    PT Duration  8 weeks    PT Treatment/Interventions  ADLs/Self Care Home Management;Cryotherapy;Electrical Stimulation;Moist Heat;Gait training;Stair training;Functional mobility training;Therapeutic activities;Therapeutic exercise;Balance training;Patient/family education;Neuromuscular re-education;Manual techniques;Passive range of motion;Taping;Vasopneumatic Device    PT Next Visit Plan  Rt knee ROM progression, edema management, soft tissue to distal hamstrings and proximal gastroc, gait training with his quad cane if he brings , Rt hip and knee strength    PT Home Exercise Plan  access code:  [email or web address]     Consulted and Agree with Plan of Care  Patient       Patient will benefit from skilled therapeutic intervention in order to improve the following deficits and impairments:  Pain, Abnormal gait, Decreased mobility, Decreased activity tolerance, Decreased endurance, Decreased range of motion, Decreased strength, Impaired flexibility, Increased edema, Difficulty walking  Visit Diagnosis: Acute pain of right knee  Localized edema  Muscle weakness (generalized)  Stiffness of right knee, not elsewhere classified  Other abnormalities of gait and mobility     Problem List Patient Active Problem List   Diagnosis Date Noted  . Arthritis of right knee 07/12/2017  . Unilateral primary osteoarthritis, right knee 06/21/2017    Sareen Randon, PTA 08/05/2017, 10:26 AM  [facility] [address] [place], [place], [address] Phone: [phone]   Fax:  [phone]  Name: [name] MRN: [id number] Date of Birth: [birth year]

## 2017-08-07 ENCOUNTER — Encounter: Payer: Self-pay | Admitting: Physical Therapy

## 2017-08-07 ENCOUNTER — Ambulatory Visit: Payer: BLUE CROSS/BLUE SHIELD | Attending: Orthopaedic Surgery | Admitting: Physical Therapy

## 2017-08-07 DIAGNOSIS — M6281 Muscle weakness (generalized): Secondary | ICD-10-CM | POA: Diagnosis present

## 2017-08-07 DIAGNOSIS — R600 Localized edema: Secondary | ICD-10-CM | POA: Insufficient documentation

## 2017-08-07 DIAGNOSIS — M25561 Pain in right knee: Secondary | ICD-10-CM

## 2017-08-07 DIAGNOSIS — M25661 Stiffness of right knee, not elsewhere classified: Secondary | ICD-10-CM | POA: Insufficient documentation

## 2017-08-07 DIAGNOSIS — R2689 Other abnormalities of gait and mobility: Secondary | ICD-10-CM | POA: Diagnosis present

## 2017-08-07 NOTE — Therapy (Signed)
[facility] [address], [name], [place], [address] Phone: [phone]   Fax:  [phone]  Physical Therapy Treatment  Patient Details  Name: [name] MRN: [id number] Date of Birth: [birth year] Referring Provider: Rodell Perna, MD   Encounter Date: 08/07/2017  PT End of Session - 08/07/17 0927    Visit Number  5    Date for PT Re-Evaluation  09/23/17    Authorization Type  BCBS    PT Start Time  0925    PT Stop Time  1025    PT Time Calculation (min)  60 min    Activity Tolerance  Patient tolerated treatment well    Behavior During Therapy  [facility] for tasks assessed/performed       Past Medical History:  Diagnosis Date  . Acid reflux   . Arthritis   . Depression   . Glaucoma   . Hypertension     Past Surgical History:  Procedure Laterality Date  . ANKLE FRACTURE SURGERY     states broken leg not ankle  . COLONOSCOPY    . KNEE SURGERY    . TOTAL KNEE ARTHROPLASTY Right 07/12/2017   Procedure: RIGHT TOTAL KNEE ARTHROPLASTY -CEMENTED;  Surgeon: Marybelle Killings, MD;  Location: [place];  Service: Orthopedics;  Laterality: Right;    There were no vitals filed for this visit.  Subjective Assessment - 08/07/17 0928    Subjective  I walked a lot yesterday so I am sore. I forgot my cane to practice.     Pertinent History  Rt TKA: 07/12/17    Limitations  Standing;Walking    How long can you stand comfortably?  Lt>Rt weightbearing.  Using walker    Patient Stated Goals  wean from walker, improve Rt knee strength and flexibility, return to prior level of funtion    Currently in Pain?  Yes    Pain Score  2     Pain Location  Knee    Pain Orientation  Right    Pain Descriptors / Indicators  Sore    Multiple Pain Sites  No         [facility] PT Assessment - 08/07/17 0001      AROM   Right Knee Flexion  90                   [facility] Adult PT Treatment/Exercise - 08/07/17 0001      Knee/Hip Exercises:  Aerobic   Nustep  L1 x 8 min PTA present for status      Knee/Hip Exercises: Standing   Forward Step Up  Right;1 set;10 reps;Hand Hold: 2;Step Height: 4"    Rebounder  weight shifting 3 ways 3x1 minute VC for position of RT foot, tends to evert      Knee/Hip Exercises: Seated   Long Arc Quad  AROM;Strengthening;Right;2 sets;10 reps    Ball Squeeze  20x 5 sec hold    Knee/Hip Flexion  2x10 red band      Vasopneumatic   Number Minutes Vasopneumatic   15 minutes    Vasopnuematic Location   Knee    Vasopneumatic Pressure  Medium    Vasopneumatic Temperature   3 snowflakes      Manual Therapy   Manual Therapy  Myofascial release;Soft tissue mobilization    Manual therapy comments  soft tissue elongation to Rt distal quads and hamstrings and PROM into flexion and extension.  Patellar mobs Instrument asst for increased gliding of  tissues quads,calf               PT Short Term Goals - 07/29/17 0948      PT SHORT TERM GOAL #1   Title  be independent in initial HEP    Time  4    Period  Weeks    Status  New    Target Date  08/26/17      PT SHORT TERM GOAL #2   Title  demonstrate Rt knee A/ROM extension to lacking < or = to 10 degrees to normalize gait pattern    Time  4    Period  Weeks    Status  New    Target Date  08/26/17      PT SHORT TERM GOAL #3   Title  demonstrate Rt knee A/ROM flexion to > or = to 95 degrees to allow for sitting and walking without substitution    Time  4    Period  Weeks    Status  New    Target Date  08/26/17      PT SHORT TERM GOAL #4   Title  reduce Rt knee pain to allow for walking > 10 minutes without limitation    Time  4    Period  Weeks    Status  New    Target Date  08/26/17        PT Long Term Goals - 07/29/17 1106      PT LONG TERM GOAL #1   Title  be independent in advanced HEP    Time  8    Period  Weeks    Status  New    Target Date  09/23/17      PT LONG TERM GOAL #2   Title  reduce FOTO to < or = to 40%  limitation    Time  8    Period  Weeks    Status  New    Target Date  09/23/17      PT LONG TERM GOAL #3   Title  improve Rt knee A/ROM extension to lacking < or = to 5 degrees to normalize gait pattern    Time  8    Period  Weeks    Status  New    Target Date  09/23/17      PT LONG TERM GOAL #4   Title  improve Rt knee and hip strength to wean from walker to use of cane for all distances with safety    Time  8    Period  Weeks    Status  New    Target Date  09/23/17      PT LONG TERM GOAL #5   Title  demonstrate > or = to 120 degrees of Rt knee flexion to improve mobility, gait and squatting    Time  8    Period  Weeks    Status  New    Target Date  09/23/17      Additional Long Term Goals   Additional Long Term Goals  Yes      PT LONG TERM GOAL #6   Title  improve strength and reduce Rt knee pain to allow for walking 30 minutes without limitation to improve independence in the community    Time  8    Period  Weeks    Status  New    Target Date  09/23/17            Plan -  08/07/17 0240    Clinical Impression Statement  Pt did not bring his quad cane today to practice. He will bring Friday. He reports he has been doing a lot of walking at home and stretching of his knee. Edema improving but still present. Pt started 4 inch step ups today, needing verbal cues to not circumduct hip. Pt achieved 90 degrees of flexion today.     Rehab Potential  Excellent    PT Frequency  3x / week    PT Duration  8 weeks    PT Treatment/Interventions  ADLs/Self Care Home Management;Cryotherapy;Electrical Stimulation;Moist Heat;Gait training;Stair training;Functional mobility training;Therapeutic activities;Therapeutic exercise;Balance training;Patient/family education;Neuromuscular re-education;Manual techniques;Passive range of motion;Taping;Vasopneumatic Device    PT Next Visit Plan  Rt knee ROM progression, edema management, soft tissue to distal hamstrings and proximal gastroc, gait  training with his quad cane if he brings , Rt hip and knee strength    PT Home Exercise Plan  access code:  [email or web address]     Consulted and Agree with Plan of Care  Patient       Patient will benefit from skilled therapeutic intervention in order to improve the following deficits and impairments:  Pain, Abnormal gait, Decreased mobility, Decreased activity tolerance, Decreased endurance, Decreased range of motion, Decreased strength, Impaired flexibility, Increased edema, Difficulty walking  Visit Diagnosis: Acute pain of right knee  Localized edema  Muscle weakness (generalized)  Stiffness of right knee, not elsewhere classified  Other abnormalities of gait and mobility     Problem List Patient Active Problem List   Diagnosis Date Noted  . Arthritis of right knee 07/12/2017  . Unilateral primary osteoarthritis, right knee 06/21/2017    Zelma Snead, PTA 08/07/2017, 10:11 AM  [facility] [address], [place], [place], [address] Phone: [phone]   Fax:  [phone]  Name: ALAM GUTERREZ MRN: [id number] Date of Birth: 1955/09/18

## 2017-08-09 ENCOUNTER — Encounter: Payer: Self-pay | Admitting: Physical Therapy

## 2017-08-09 ENCOUNTER — Ambulatory Visit: Payer: BLUE CROSS/BLUE SHIELD | Admitting: Physical Therapy

## 2017-08-09 DIAGNOSIS — M25561 Pain in right knee: Secondary | ICD-10-CM | POA: Diagnosis not present

## 2017-08-09 DIAGNOSIS — M6281 Muscle weakness (generalized): Secondary | ICD-10-CM

## 2017-08-09 DIAGNOSIS — R600 Localized edema: Secondary | ICD-10-CM

## 2017-08-09 DIAGNOSIS — M25661 Stiffness of right knee, not elsewhere classified: Secondary | ICD-10-CM

## 2017-08-09 DIAGNOSIS — R2689 Other abnormalities of gait and mobility: Secondary | ICD-10-CM

## 2017-08-09 NOTE — Therapy (Signed)
[facility] [address] [place], [place], [address] Phone: [phone]   Fax:  [phone]  Physical Therapy Treatment  Patient Details  Name: [name] MRN: [id number] Date of Birth: September 30, 1955 Referring Provider: Rodell Perna, MD   Encounter Date: 08/09/2017  PT End of Session - 08/09/17 0922    Visit Number  6    Date for PT Re-Evaluation  09/23/17    Authorization Type  BCBS    PT Start Time  0921    PT Stop Time  1035    PT Time Calculation (min)  74 min    Activity Tolerance  Patient tolerated treatment well    Behavior During Therapy  [facility] for tasks assessed/performed       Past Medical History:  Diagnosis Date  . Acid reflux   . Arthritis   . Depression   . Glaucoma   . Hypertension     Past Surgical History:  Procedure Laterality Date  . ANKLE FRACTURE SURGERY     states broken leg not ankle  . COLONOSCOPY    . KNEE SURGERY    . TOTAL KNEE ARTHROPLASTY Right 07/12/2017   Procedure: RIGHT TOTAL KNEE ARTHROPLASTY -CEMENTED;  Surgeon: Marybelle Killings, MD;  Location: [place];  Service: Orthopedics;  Laterality: Right;    There were no vitals filed for this visit.  Subjective Assessment - 08/09/17 0923    Subjective  Pt brought his quad cane today. Had a rough AM, my knee had a deep ache all morning. Took some medications that it helped a little.     Pertinent History  Rt TKA: 07/12/17    Limitations  Standing;Walking    How long can you stand comfortably?  Lt>Rt weightbearing.  Using walker    How long can you walk comfortably?  unknow    Patient Stated Goals  wean from walker, improve Rt knee strength and flexibility, return to prior level of funtion    Currently in Pain?  Yes    Pain Score  2     Pain Location  Knee    Pain Orientation  Right    Pain Descriptors / Indicators  Dull;Aching    Aggravating Factors   Night/early AM    Pain Relieving Factors  ice, meds    Multiple Pain Sites  No          [facility] PT Assessment - 08/09/17 0001      AROM   Right Knee Flexion  95 supine                   [facility] Adult PT Treatment/Exercise - 08/09/17 0001      Ambulation/Gait   Ambulation/Gait  Yes    Ambulation/Gait Assistance  6: Modified independent (Device/Increase time);4: Min guard    Ambulation Distance (Feet)  200 Feet    Assistive device  Large base quad cane    Gait Pattern  Decreased hip/knee flexion - right;Decreased dorsiflexion - right;Right foot flat    Gait Comments  VC for increased knee and ankle flexion with RW      Knee/Hip Exercises: Stretches   Gastroc Stretch  Right;3 reps;20 seconds On slant board, VC for corretc position, VC to stand erect      Knee/Hip Exercises: Aerobic   Stationary Bike  L0 3 min rocking Pt able to make almost full revolution in reverse    Nustep  L2 x 10 min PTA present for status update  Knee/Hip Exercises: Standing   Forward Step Up  Right;2 sets;10 reps;Hand Hold: 2;Step Height: 4" Light UE, less circumduction today    Rebounder  weight shifting 3 ways 3x1 minute VC for position of RT foot, tends to evert      Knee/Hip Exercises: Seated   Long Arc Quad  AROM;Strengthening;Right;2 sets;15 reps;Weights    Long Arc Quad Weight  2 lbs.    Ball Squeeze  20x 5 sec hold    Knee/Hip Flexion  2x10 red band      Vasopneumatic   Number Minutes Vasopneumatic   15 minutes    Vasopnuematic Location   Knee    Vasopneumatic Pressure  Medium    Vasopneumatic Temperature   3 snowflakes      Manual Therapy   Manual Therapy  Myofascial release;Soft tissue mobilization    Manual therapy comments  soft tissue elongation to Rt distal quads and hamstrings and PROM into flexion and extension.  Patellar mobs Instrument asst for increased gliding of tissues quads,calf               PT Short Term Goals - 07/29/17 0948      PT SHORT TERM GOAL #1   Title  be independent in initial HEP    Time  4    Period  Weeks     Status  New    Target Date  08/26/17      PT SHORT TERM GOAL #2   Title  demonstrate Rt knee A/ROM extension to lacking < or = to 10 degrees to normalize gait pattern    Time  4    Period  Weeks    Status  New    Target Date  08/26/17      PT SHORT TERM GOAL #3   Title  demonstrate Rt knee A/ROM flexion to > or = to 95 degrees to allow for sitting and walking without substitution    Time  4    Period  Weeks    Status  New    Target Date  08/26/17      PT SHORT TERM GOAL #4   Title  reduce Rt knee pain to allow for walking > 10 minutes without limitation    Time  4    Period  Weeks    Status  New    Target Date  08/26/17        PT Long Term Goals - 07/29/17 1106      PT LONG TERM GOAL #1   Title  be independent in advanced HEP    Time  8    Period  Weeks    Status  New    Target Date  09/23/17      PT LONG TERM GOAL #2   Title  reduce FOTO to < or = to 40% limitation    Time  8    Period  Weeks    Status  New    Target Date  09/23/17      PT LONG TERM GOAL #3   Title  improve Rt knee A/ROM extension to lacking < or = to 5 degrees to normalize gait pattern    Time  8    Period  Weeks    Status  New    Target Date  09/23/17      PT LONG TERM GOAL #4   Title  improve Rt knee and hip strength to wean from walker to use of cane for all  distances with safety    Time  8    Period  Weeks    Status  New    Target Date  09/23/17      PT LONG TERM GOAL #5   Title  demonstrate > or = to 120 degrees of Rt knee flexion to improve mobility, gait and squatting    Time  8    Period  Weeks    Status  New    Target Date  09/23/17      Additional Long Term Goals   Additional Long Term Goals  Yes      PT LONG TERM GOAL #6   Title  improve strength and reduce Rt knee pain to allow for walking 30 minutes without limitation to improve independence in the community    Time  8    Period  Weeks    Status  New    Target Date  09/23/17            Plan - 08/09/17  6270    Clinical Impression Statement  Pt brought his quad cane today and pt was able to ambulate with it 245feet including in between his exercises. He only required verbal reminders to bend his knee as he tends to still walk with it stiff. He will walk 100% around his home with the quad cane and use the walker for longer community distances as that makes him nervous.  Pt appeared to have more edema today in his knee but he was able to flex it at the end of session to 95 degrees with ease. Less circumduction on his step ups today.     Rehab Potential  Excellent    PT Frequency  3x / week    PT Duration  8 weeks    PT Treatment/Interventions  ADLs/Self Care Home Management;Cryotherapy;Electrical Stimulation;Moist Heat;Gait training;Stair training;Functional mobility training;Therapeutic activities;Therapeutic exercise;Balance training;Patient/family education;Neuromuscular re-education;Manual techniques;Passive range of motion;Taping;Vasopneumatic Device    PT Next Visit Plan  Rt knee ROM progression, edema management, soft tissue to distal hamstrings and proximal gastroc, gait training with his quad cane if he brings , Rt hip and knee strength    PT Home Exercise Plan  access code:  [email or web address]     Consulted and Agree with Plan of Care  Patient       Patient will benefit from skilled therapeutic intervention in order to improve the following deficits and impairments:  Pain, Abnormal gait, Decreased mobility, Decreased activity tolerance, Decreased endurance, Decreased range of motion, Decreased strength, Impaired flexibility, Increased edema, Difficulty walking  Visit Diagnosis: Acute pain of right knee  Localized edema  Muscle weakness (generalized)  Stiffness of right knee, not elsewhere classified  Other abnormalities of gait and mobility     Problem List Patient Active Problem List   Diagnosis Date Noted  . Arthritis of right knee 07/12/2017  . Unilateral primary osteoarthritis,  right knee 06/21/2017    Onie Kasparek, PTA 08/09/2017, 10:50 AM  [facility] [address] [place], [place], [address] Phone: [phone]   Fax:  [phone]  Name: [name] MRN: [id number] Date of Birth: 1955-12-06

## 2017-08-12 ENCOUNTER — Encounter: Payer: Self-pay | Admitting: Physical Therapy

## 2017-08-12 ENCOUNTER — Ambulatory Visit: Payer: BLUE CROSS/BLUE SHIELD | Admitting: Physical Therapy

## 2017-08-12 DIAGNOSIS — M6281 Muscle weakness (generalized): Secondary | ICD-10-CM

## 2017-08-12 DIAGNOSIS — R2689 Other abnormalities of gait and mobility: Secondary | ICD-10-CM

## 2017-08-12 DIAGNOSIS — M25561 Pain in right knee: Secondary | ICD-10-CM

## 2017-08-12 DIAGNOSIS — M25661 Stiffness of right knee, not elsewhere classified: Secondary | ICD-10-CM

## 2017-08-12 DIAGNOSIS — R600 Localized edema: Secondary | ICD-10-CM

## 2017-08-12 NOTE — Therapy (Signed)
[facility] [address], [place], [place], [address] Phone: [phone]   Fax:  [phone]  Physical Therapy Treatment  Patient Details  Name: [name] MRN: [id number] Date of Birth: [birth year] Referring Provider: Rodell Perna, MD   Encounter Date: 08/12/2017  PT End of Session - 08/12/17 0930    Visit Number  7    Date for PT Re-Evaluation  09/23/17    Authorization Type  BCBS    PT Start Time  0930    PT Stop Time  1045    PT Time Calculation (min)  75 min    Activity Tolerance  Patient tolerated treatment well    Behavior During Therapy  [facility] for tasks assessed/performed       Past Medical History:  Diagnosis Date  . Acid reflux   . Arthritis   . Depression   . Glaucoma   . Hypertension     Past Surgical History:  Procedure Laterality Date  . ANKLE FRACTURE SURGERY     states broken leg not ankle  . COLONOSCOPY    . KNEE SURGERY    . TOTAL KNEE ARTHROPLASTY Right 07/12/2017   Procedure: RIGHT TOTAL KNEE ARTHROPLASTY -CEMENTED;  Surgeon: Marybelle Killings, MD;  Location: [place];  Service: Orthopedics;  Laterality: Right;    There were no vitals filed for this visit.  Subjective Assessment - 08/12/17 0931    Subjective  Pt walking with quad cane for all community distances now per his report, he is walking in his home without device. Still having hard time sleeping.     Pertinent History  Rt TKA: 07/12/17    Limitations  Standing;Walking    Currently in Pain?  Yes    Pain Score  2     Pain Location  Knee    Pain Orientation  Right    Pain Descriptors / Indicators  Dull;Tightness    Aggravating Factors   Night time, early AM     Pain Relieving Factors  ice, meds    Multiple Pain Sites  No         [facility] PT Assessment - 08/12/17 0001      AROM   Right Knee Flexion  97 supine                   [facility] Adult PT Treatment/Exercise - 08/12/17 0001      Knee/Hip Exercises: Stretches    Knee: Self-Stretch to increase Flexion  -- 20x rocking on second step, then static holds 3x 20 sec    Gastroc Stretch  Right;3 reps;20 seconds On slant board, VC for corretc position, VC to stand erect      Knee/Hip Exercises: Aerobic   Stationary Bike  L0 5 min rocking Pt able to make almost full revolution in reverse    Nustep  L2 x 10 min PTA present for status update      Knee/Hip Exercises: Standing   Forward Step Up  Right;1 set;10 reps;Step Height: 2";Step Height: 6" >UE with higher step, reduced RTLE strength      Knee/Hip Exercises: Seated   Long Arc Quad  AROM;Strengthening;Right;2 sets;10 reps;Weights    Long Arc Quad Weight  3 lbs.      Acupuncturist Location  RT knee    Electrical Stimulation Action  IFC    Electrical Stimulation Parameters  80-150 HZ Concurrent with Game Ready    Electrical Stimulation Goals  Pain  Vasopneumatic   Number Minutes Vasopneumatic   15 minutes    Vasopnuematic Location   Knee    Vasopneumatic Pressure  Medium    Vasopneumatic Temperature   3 snowflakes      Manual Therapy   Manual Therapy  Myofascial release;Soft tissue mobilization    Manual therapy comments  soft tissue elongation to Rt distal quads and hamstrings and PROM into flexion and extension.  Patellar mobs Instrument asst for increased gliding of tissues quads,calf               PT Short Term Goals - 07/29/17 0948      PT SHORT TERM GOAL #1   Title  be independent in initial HEP    Time  4    Period  Weeks    Status  New    Target Date  08/26/17      PT SHORT TERM GOAL #2   Title  demonstrate Rt knee A/ROM extension to lacking < or = to 10 degrees to normalize gait pattern    Time  4    Period  Weeks    Status  New    Target Date  08/26/17      PT SHORT TERM GOAL #3   Title  demonstrate Rt knee A/ROM flexion to > or = to 95 degrees to allow for sitting and walking without substitution    Time  4    Period  Weeks     Status  New    Target Date  08/26/17      PT SHORT TERM GOAL #4   Title  reduce Rt knee pain to allow for walking > 10 minutes without limitation    Time  4    Period  Weeks    Status  New    Target Date  08/26/17        PT Long Term Goals - 07/29/17 1106      PT LONG TERM GOAL #1   Title  be independent in advanced HEP    Time  8    Period  Weeks    Status  New    Target Date  09/23/17      PT LONG TERM GOAL #2   Title  reduce FOTO to < or = to 40% limitation    Time  8    Period  Weeks    Status  New    Target Date  09/23/17      PT LONG TERM GOAL #3   Title  improve Rt knee A/ROM extension to lacking < or = to 5 degrees to normalize gait pattern    Time  8    Period  Weeks    Status  New    Target Date  09/23/17      PT LONG TERM GOAL #4   Title  improve Rt knee and hip strength to wean from walker to use of cane for all distances with safety    Time  8    Period  Weeks    Status  New    Target Date  09/23/17      PT LONG TERM GOAL #5   Title  demonstrate > or = to 120 degrees of Rt knee flexion to improve mobility, gait and squatting    Time  8    Period  Weeks    Status  New    Target Date  09/23/17      Additional Long Term  Goals   Additional Long Term Goals  Yes      PT LONG TERM GOAL #6   Title  improve strength and reduce Rt knee pain to allow for walking 30 minutes without limitation to improve independence in the community    Time  8    Period  Weeks    Status  New    Target Date  09/23/17            Plan - 08/12/17 0930    Clinical Impression Statement  Pt is now ambulating with his quad cane 100% of community distances and no device in his home. Still requires some verbal cuing to bend his knee while he walks.  He was able to bend a little further at the end of his sesison today.  We added some height to his step ups exercise today which he struugles with initially, using more UE to push himself up. This improved some with practice.  Increased weights with LAQ today, pt had no difficulty with this today.     Rehab Potential  Excellent    PT Frequency  3x / week    PT Duration  8 weeks    PT Treatment/Interventions  ADLs/Self Care Home Management;Cryotherapy;Electrical Stimulation;Moist Heat;Gait training;Stair training;Functional mobility training;Therapeutic activities;Therapeutic exercise;Balance training;Patient/family education;Neuromuscular re-education;Manual techniques;Passive range of motion;Taping;Vasopneumatic Device    PT Next Visit Plan  Knee AROm and strength. Leg press next.     PT Home Exercise Plan  access code:  [email or web address]     Consulted and Agree with Plan of Care  Patient       Patient will benefit from skilled therapeutic intervention in order to improve the following deficits and impairments:  Pain, Abnormal gait, Decreased mobility, Decreased activity tolerance, Decreased endurance, Decreased range of motion, Decreased strength, Impaired flexibility, Increased edema, Difficulty walking  Visit Diagnosis: Acute pain of right knee  Localized edema  Muscle weakness (generalized)  Stiffness of right knee, not elsewhere classified  Other abnormalities of gait and mobility     Problem List Patient Active Problem List   Diagnosis Date Noted  . Arthritis of right knee 07/12/2017  . Unilateral primary osteoarthritis, right knee 06/21/2017    Amyjo Mizrachi, PTA 08/12/2017, 10:33 AM  [facility] [address], [place], [place], [address] Phone: [phone]   Fax:  [phone]  Name: [name] MRN: [id number] Date of Birth: 1955-09-24

## 2017-08-14 ENCOUNTER — Telehealth (INDEPENDENT_AMBULATORY_CARE_PROVIDER_SITE_OTHER): Payer: Self-pay | Admitting: Orthopaedic Surgery

## 2017-08-14 ENCOUNTER — Encounter: Payer: Self-pay | Admitting: Physical Therapy

## 2017-08-14 ENCOUNTER — Ambulatory Visit: Payer: BLUE CROSS/BLUE SHIELD | Admitting: Physical Therapy

## 2017-08-14 DIAGNOSIS — M25561 Pain in right knee: Secondary | ICD-10-CM

## 2017-08-14 DIAGNOSIS — R2689 Other abnormalities of gait and mobility: Secondary | ICD-10-CM

## 2017-08-14 DIAGNOSIS — M6281 Muscle weakness (generalized): Secondary | ICD-10-CM

## 2017-08-14 DIAGNOSIS — M25661 Stiffness of right knee, not elsewhere classified: Secondary | ICD-10-CM

## 2017-08-14 DIAGNOSIS — R600 Localized edema: Secondary | ICD-10-CM

## 2017-08-14 MED ORDER — TRAMADOL HCL 50 MG PO TABS: 50.0000 mg | ORAL_TABLET | Freq: Two times a day (BID) | ORAL | 0 refills | Status: DC | PRN

## 2017-08-14 NOTE — Telephone Encounter (Signed)
Call in ultram # 20 1 po bid prn. Time to stop percocet. ucall thanks. Tell him therapy report looked good, dr. Lorin Mercy looked at it .

## 2017-08-14 NOTE — Therapy (Signed)
[facility] [address] [place], [place], [address] Phone: [phone]   Fax:  [phone]  Physical Therapy Treatment  Patient Details  Name: [name] MRN: [id number] Date of Birth: [birth year] Referring Provider: Rodell Perna, MD   Encounter Date: 08/14/2017  PT End of Session - 08/14/17 0928    Visit Number  8    Date for PT Re-Evaluation  09/23/17    Authorization Type  BCBS    PT Start Time  0928    PT Stop Time  1055    PT Time Calculation (min)  87 min    Activity Tolerance  Patient tolerated treatment well    Behavior During Therapy  [facility] for tasks assessed/performed       Past Medical History:  Diagnosis Date  . Acid reflux   . Arthritis   . Depression   . Glaucoma   . Hypertension     Past Surgical History:  Procedure Laterality Date  . ANKLE FRACTURE SURGERY     states broken leg not ankle  . COLONOSCOPY    . KNEE SURGERY    . TOTAL KNEE ARTHROPLASTY Right 07/12/2017   Procedure: RIGHT TOTAL KNEE ARTHROPLASTY -CEMENTED;  Surgeon: Marybelle Killings, MD;  Location: [place];  Service: Orthopedics;  Laterality: Right;    There were no vitals filed for this visit.  Subjective Assessment - 08/14/17 0929    Subjective  Pt reports he feels 50% improved since starting PT.     Pertinent History  Rt TKA: 07/12/17    Limitations  Standing;Walking    Currently in Pain?  Yes    Pain Score  1     Pain Location  Knee    Pain Orientation  Right    Pain Descriptors / Indicators  Dull    Multiple Pain Sites  No         [facility] PT Assessment - 08/14/17 0001      AROM   Right Knee Extension  10    Right Knee Flexion  97                   [facility] Adult PT Treatment/Exercise - 08/14/17 0001      Ambulation/Gait   Ambulation/Gait  Yes    Ambulation Distance (Feet)  200 Feet    Assistive device  Large base quad cane    Gait Pattern  -- RTLE external rotation of hip    Gait Comments  VC for  neutral LE not rotated      Knee/Hip Exercises: Stretches   Active Hamstring Stretch  Right;3 reps;20 seconds Seated    Knee: Self-Stretch to increase Flexion  -- 20x rocking on second step, then static holds 3x 20 sec    Gastroc Stretch  Right;3 reps;20 seconds On slant board, VC for corretc position, VC to stand erect      Knee/Hip Exercises: Aerobic   Stationary Bike  L0 x 6 min, Pt could do full forward revolution with small hike of hip.     Nustep  L2 x 10 min PTA present to monitor      Knee/Hip Exercises: Machines for Strengthening   Total Gym Leg Press  Seat 5; Bil 50# 2x 10 with ball squeeze.       Knee/Hip Exercises: Standing   Rebounder  weight shifting 3 ways 3x1 minute VC for position of RT foot, tends to evert      Knee/Hip Exercises: Supine  Knee Flexion  -- 20x on red ball      Vasopneumatic   Number Minutes Vasopneumatic   15 minutes    Vasopnuematic Location   Knee    Vasopneumatic Pressure  Medium    Vasopneumatic Temperature   3 snowflakes      Manual Therapy   Manual Therapy  Myofascial release;Soft tissue mobilization    Manual therapy comments  soft tissue elongation to Rt distal quads and hamstrings and PROM into flexion and extension.  Patellar mobs Instrument asst for increased gliding of tissues quads,calf               PT Short Term Goals - 08/14/17 1006      PT SHORT TERM GOAL #1   Title  be independent in initial HEP    Time  4    Period  Weeks    Status  Achieved      PT SHORT TERM GOAL #3   Title  demonstrate Rt knee A/ROM flexion to > or = to 95 degrees to allow for sitting and walking without substitution    Time  4    Period  Weeks    Status  Achieved      PT SHORT TERM GOAL #4   Title  reduce Rt knee pain to allow for walking > 10 minutes without limitation    Time  4    Period  Weeks    Status  Achieved 10-20 min        PT Long Term Goals - 07/29/17 1106      PT LONG TERM GOAL #1   Title  be independent in  advanced HEP    Time  8    Period  Weeks    Status  New    Target Date  09/23/17      PT LONG TERM GOAL #2   Title  reduce FOTO to < or = to 40% limitation    Time  8    Period  Weeks    Status  New    Target Date  09/23/17      PT LONG TERM GOAL #3   Title  improve Rt knee A/ROM extension to lacking < or = to 5 degrees to normalize gait pattern    Time  8    Period  Weeks    Status  New    Target Date  09/23/17      PT LONG TERM GOAL #4   Title  improve Rt knee and hip strength to wean from walker to use of cane for all distances with safety    Time  8    Period  Weeks    Status  New    Target Date  09/23/17      PT LONG TERM GOAL #5   Title  demonstrate > or = to 120 degrees of Rt knee flexion to improve mobility, gait and squatting    Time  8    Period  Weeks    Status  New    Target Date  09/23/17      Additional Long Term Goals   Additional Long Term Goals  Yes      PT LONG TERM GOAL #6   Title  improve strength and reduce Rt knee pain to allow for walking 30 minutes without limitation to improve independence in the community    Time  8    Period  Weeks    Status  New  Target Date  09/23/17            Plan - 08/14/17 0928    Clinical Impression Statement  Tried the leg press today. Pt had difficulty holding RTLE neutral, his tendency is to externally rotate his hip. PTA had him squeeze a ball between his knees Pt contiinues to need verbal cuing for RT hip external  rotation when he ambulates.  Pt has decreased his knee extension to 10 degrees and he maintained his knee flexion from last session. He was very sore from the bike today performing the forwards revolution even with slight hip hiking.     Rehab Potential  Excellent    PT Frequency  3x / week    PT Duration  8 weeks    PT Treatment/Interventions  ADLs/Self Care Home Management;Cryotherapy;Electrical Stimulation;Moist Heat;Gait training;Stair training;Functional mobility training;Therapeutic  activities;Therapeutic exercise;Balance training;Patient/family education;Neuromuscular re-education;Manual techniques;Passive range of motion;Taping;Vasopneumatic Device    PT Next Visit Plan  Knee AROm and strength. Work on hamstring stretching.     PT Home Exercise Plan  access code:  [email or web address]     Consulted and Agree with Plan of Care  Patient       Patient will benefit from skilled therapeutic intervention in order to improve the following deficits and impairments:  Pain, Abnormal gait, Decreased mobility, Decreased activity tolerance, Decreased endurance, Decreased range of motion, Decreased strength, Impaired flexibility, Increased edema, Difficulty walking  Visit Diagnosis: Acute pain of right knee  Localized edema  Muscle weakness (generalized)  Stiffness of right knee, not elsewhere classified  Other abnormalities of gait and mobility     Problem List Patient Active Problem List   Diagnosis Date Noted  . Arthritis of right knee 07/12/2017  . Unilateral primary osteoarthritis, right knee 06/21/2017    Lourdez Mcgahan, PTA 08/14/2017, 11:02 AM  [facility] [address] [place], [place], [address] Phone: [phone]   Fax:  [phone]  Name: [name] MRN: [id number] Date of Birth: 12-Oct-1955

## 2017-08-14 NOTE — Telephone Encounter (Signed)
Please advise 

## 2017-08-14 NOTE — Telephone Encounter (Signed)
Med refill  Oxycodone-acetaminophen

## 2017-08-14 NOTE — Telephone Encounter (Signed)
Called to pharmacy. I called patient and advised. He states that he is still very sore but is working hard in PT.

## 2017-08-16 ENCOUNTER — Encounter: Payer: Self-pay | Admitting: Physical Therapy

## 2017-08-16 ENCOUNTER — Ambulatory Visit: Payer: BLUE CROSS/BLUE SHIELD | Admitting: Physical Therapy

## 2017-08-16 DIAGNOSIS — M25661 Stiffness of right knee, not elsewhere classified: Secondary | ICD-10-CM

## 2017-08-16 DIAGNOSIS — R600 Localized edema: Secondary | ICD-10-CM

## 2017-08-16 DIAGNOSIS — M25561 Pain in right knee: Secondary | ICD-10-CM | POA: Diagnosis not present

## 2017-08-16 DIAGNOSIS — M6281 Muscle weakness (generalized): Secondary | ICD-10-CM

## 2017-08-16 DIAGNOSIS — R2689 Other abnormalities of gait and mobility: Secondary | ICD-10-CM

## 2017-08-16 NOTE — Therapy (Signed)
[facility] [address] [place], [place], [address] Phone: [phone]   Fax:  [phone]  Physical Therapy Treatment  Patient Details  Name: [name] MRN: [id number] Date of Birth: [birth year] Referring Provider: Rodell Perna, MD   Encounter Date: 08/16/2017  PT End of Session - 08/16/17 0934    Visit Number  9    Date for PT Re-Evaluation  09/23/17    Authorization Type  BCBS    PT Start Time  0925    PT Stop Time  1033    PT Time Calculation (min)  68 min    Activity Tolerance  Patient tolerated treatment well    Behavior During Therapy  [facility] for tasks assessed/performed       Past Medical History:  Diagnosis Date  . Acid reflux   . Arthritis   . Depression   . Glaucoma   . Hypertension     Past Surgical History:  Procedure Laterality Date  . ANKLE FRACTURE SURGERY     states broken leg not ankle  . COLONOSCOPY    . KNEE SURGERY    . TOTAL KNEE ARTHROPLASTY Right 07/12/2017   Procedure: RIGHT TOTAL KNEE ARTHROPLASTY -CEMENTED;  Surgeon: Marybelle Killings, MD;  Location: [place];  Service: Orthopedics;  Laterality: Right;    There were no vitals filed for this visit.  Subjective Assessment - 08/16/17 0935    Subjective  I felt really good after last session. I even slept better last night.    Pertinent History  Rt TKA: 07/12/17    Currently in Pain?  Yes    Pain Score  1     Pain Location  Knee    Pain Orientation  Right    Pain Descriptors / Indicators  Dull    Multiple Pain Sites  No                       [facility] Adult PT Treatment/Exercise - 08/16/17 0001      Knee/Hip Exercises: Stretches   Active Hamstring Stretch  Right;3 reps;20 seconds Seated    Knee: Self-Stretch to increase Flexion  -- 20x rocking on second step, then static holds 3x 20 sec    Gastroc Stretch  Right;3 reps;20 seconds On slant board, VC for corretc position, VC to stand erect      Knee/Hip Exercises:  Aerobic   Stationary Bike  L0 x 6 min Fwrd rev slow VC for hip hike    Nustep  L2 x 10 min PTA present to monitor      Knee/Hip Exercises: Machines for Strengthening   Total Gym Leg Press  Seat 5: 55# bil 2x10      Knee/Hip Exercises: Seated   Long Arc Quad  Strengthening;Left;3 sets;10 reps;Weights    Long Arc Quad Weight  3 lbs.      Acupuncturist Location  RT knee    Electrical Stimulation Action  IFC    Electrical Stimulation Parameters  80-150 HZ    Electrical Stimulation Goals  Pain This was also done last session 08/14/17      Vasopneumatic   Number Minutes Vasopneumatic   15 minutes    Vasopnuematic Location   Knee    Vasopneumatic Pressure  Medium    Vasopneumatic Temperature   3 snowflakes      Manual Therapy   Manual Therapy  Myofascial release;Soft tissue mobilization    Manual therapy  comments  soft tissue elongation to Rt distal quads and hamstrings and PROM into flexion and extension.  Patellar mobs Instrument asst for increased gliding of tissues quads,calf               PT Short Term Goals - 08/14/17 1006      PT SHORT TERM GOAL #1   Title  be independent in initial HEP    Time  4    Period  Weeks    Status  Achieved      PT SHORT TERM GOAL #3   Title  demonstrate Rt knee A/ROM flexion to > or = to 95 degrees to allow for sitting and walking without substitution    Time  4    Period  Weeks    Status  Achieved      PT SHORT TERM GOAL #4   Title  reduce Rt knee pain to allow for walking > 10 minutes without limitation    Time  4    Period  Weeks    Status  Achieved 10-20 min        PT Long Term Goals - 07/29/17 1106      PT LONG TERM GOAL #1   Title  be independent in advanced HEP    Time  8    Period  Weeks    Status  New    Target Date  09/23/17      PT LONG TERM GOAL #2   Title  reduce FOTO to < or = to 40% limitation    Time  8    Period  Weeks    Status  New    Target Date  09/23/17      PT  LONG TERM GOAL #3   Title  improve Rt knee A/ROM extension to lacking < or = to 5 degrees to normalize gait pattern    Time  8    Period  Weeks    Status  New    Target Date  09/23/17      PT LONG TERM GOAL #4   Title  improve Rt knee and hip strength to wean from walker to use of cane for all distances with safety    Time  8    Period  Weeks    Status  New    Target Date  09/23/17      PT LONG TERM GOAL #5   Title  demonstrate > or = to 120 degrees of Rt knee flexion to improve mobility, gait and squatting    Time  8    Period  Weeks    Status  New    Target Date  09/23/17      Additional Long Term Goals   Additional Long Term Goals  Yes      PT LONG TERM GOAL #6   Title  improve strength and reduce Rt knee pain to allow for walking 30 minutes without limitation to improve independence in the community    Time  8    Period  Weeks    Status  New    Target Date  09/23/17            Plan - 08/16/17 0934    Clinical Impression Statement  Pt reports today he has gotten some better sleep, pain down some in the evening and early morning. pt has done well with additions to his program like the leg press and  hamstring stretching.  Pt repostrs doing  the Estim with the Game Ready is helping reduce his knee pain.  Verbal cues throughout for RTLE alignment, some improvements noted., pt is trying hard. Pt reports he wa sable to get out in his garden some yesterday.     Rehab Potential  Excellent    PT Frequency  3x / week    PT Duration  8 weeks    PT Treatment/Interventions  ADLs/Self Care Home Management;Cryotherapy;Electrical Stimulation;Moist Heat;Gait training;Stair training;Functional mobility training;Therapeutic activities;Therapeutic exercise;Balance training;Patient/family education;Neuromuscular re-education;Manual techniques;Passive range of motion;Taping;Vasopneumatic Device    PT Next Visit Plan  10th visit PN needed next, knee ROM, strenngth and Game Ready for edema.      PT Home Exercise Plan  access code:  [email or web address]     Consulted and Agree with Plan of Care  Patient       Patient will benefit from skilled therapeutic intervention in order to improve the following deficits and impairments:  Pain, Abnormal gait, Decreased mobility, Decreased activity tolerance, Decreased endurance, Decreased range of motion, Decreased strength, Impaired flexibility, Increased edema, Difficulty walking  Visit Diagnosis: Acute pain of right knee  Localized edema  Muscle weakness (generalized)  Stiffness of right knee, not elsewhere classified  Other abnormalities of gait and mobility     Problem List Patient Active Problem List   Diagnosis Date Noted  . Arthritis of right knee 07/12/2017  . Unilateral primary osteoarthritis, right knee 06/21/2017    Tylie Golonka, PTA 08/16/2017, 11:49 AM  [facility] [address], [place], [place], [address] Phone: [phone]   Fax:  [phone]  Name: [name] MRN: [id number] Date of Birth: 12/28/1955

## 2017-08-19 ENCOUNTER — Ambulatory Visit: Payer: BLUE CROSS/BLUE SHIELD

## 2017-08-19 DIAGNOSIS — M25661 Stiffness of right knee, not elsewhere classified: Secondary | ICD-10-CM

## 2017-08-19 DIAGNOSIS — M6281 Muscle weakness (generalized): Secondary | ICD-10-CM

## 2017-08-19 DIAGNOSIS — R2689 Other abnormalities of gait and mobility: Secondary | ICD-10-CM

## 2017-08-19 DIAGNOSIS — M25561 Pain in right knee: Secondary | ICD-10-CM | POA: Diagnosis not present

## 2017-08-19 DIAGNOSIS — R600 Localized edema: Secondary | ICD-10-CM

## 2017-08-19 NOTE — Therapy (Signed)
[facility] [address] [place], [place], [address] Phone: [phone]   Fax:  [phone]  Physical Therapy Treatment  Patient Details  Name: [name] MRN: [id number] Date of Birth: 01-17-1956 Referring Provider: Rodell Perna, MD   Encounter Date: 08/19/2017 Progress Note Reporting Period 07/29/17 to 08/19/17  See note below for Objective Data and Assessment of Progress/Goals.      PT End of Session - 08/19/17 1015    Visit Number  10    Date for PT Re-Evaluation  09/23/17    Authorization Type  BCBS    PT Start Time  0928    PT Stop Time  1029    PT Time Calculation (min)  61 min    Activity Tolerance  Patient tolerated treatment well    Behavior During Therapy  [facility] for tasks assessed/performed       Past Medical History:  Diagnosis Date  . Acid reflux   . Arthritis   . Depression   . Glaucoma   . Hypertension     Past Surgical History:  Procedure Laterality Date  . ANKLE FRACTURE SURGERY     states broken leg not ankle  . COLONOSCOPY    . KNEE SURGERY    . TOTAL KNEE ARTHROPLASTY Right 07/12/2017   Procedure: RIGHT TOTAL KNEE ARTHROPLASTY -CEMENTED;  Surgeon: Marybelle Killings, MD;  Location: [place];  Service: Orthopedics;  Laterality: Right;    There were no vitals filed for this visit.  Subjective Assessment - 08/19/17 0929    Subjective  I was hurting a lot yesterday afternoon.  I had to take a pain pill.      Pertinent History  Rt TKA: 07/12/17    Patient Stated Goals  wean from walker, improve Rt knee strength and flexibility, return to prior level of funtion    Currently in Pain?  Yes    Pain Score  1     Pain Location  Knee    Pain Orientation  Right    Pain Descriptors / Indicators  Dull;Tightness    Pain Type  Surgical pain    Pain Onset  More than a month ago    Pain Frequency  Constant    Aggravating Factors   night time, early AM    Pain Relieving Factors  rest, ice, pain  medication         [facility] PT Assessment - 08/19/17 0001      Assessment   Medical Diagnosis  Rt knee OA, s/p TKA      Cognition   Overall Cognitive Status  Within Functional Limits for tasks assessed      AROM   Right Knee Extension  10    Right Knee Flexion  94      Ambulation/Gait   Ambulation/Gait  Yes    Ambulation Distance (Feet)  200 Feet    Assistive device  Large base quad cane    Gait Pattern  -- RTLE external rotation of hip    Gait Comments  VC for neutral LE not rotated                   [facility] Adult PT Treatment/Exercise - 08/19/17 0001      Knee/Hip Exercises: Stretches   Active Hamstring Stretch  Right;3 reps;20 seconds Seated    Gastroc Stretch  Right;3 reps;20 seconds On slant board, VC for corretc position, VC to stand erect      Knee/Hip Exercises:  Aerobic   Stationary Bike  --    Nustep  L2 x 10 min PTA present to monitor      Knee/Hip Exercises: Machines for Strengthening   Total Gym Leg Press  Seat 5: 55# bil 3x10, Rt only 40# 2x10      Knee/Hip Exercises: Standing   Rebounder  weight shifting 3 ways 3x1 minute VC for position of RT foot, tends to evert      Knee/Hip Exercises: Seated   Long Arc Quad  Strengthening;Left;3 sets;10 reps;Weights    Long Arc Quad Weight  3 lbs.      Control and instrumentation engineer  IFC    Electrical Stimulation Parameters  15 minutes    Electrical Stimulation Goals  Pain      Vasopneumatic   Number Minutes Vasopneumatic   15 minutes    Vasopnuematic Location   Knee    Vasopneumatic Pressure  Medium    Vasopneumatic Temperature   3 snowflakes      Manual Therapy   Manual Therapy  Myofascial release;Soft tissue mobilization    Manual therapy comments  soft tissue elongation to Rt distal quads and hamstrings and PROM into flexion and extension.  Patellar mobs               PT Short Term Goals - 08/19/17 0930      PT  SHORT TERM GOAL #1   Title  be independent in initial HEP    Status  Achieved      PT SHORT TERM GOAL #2   Title  demonstrate Rt knee A/ROM extension to lacking < or = to 10 degrees to normalize gait pattern    Baseline  10 degrees    Status  Achieved      PT SHORT TERM GOAL #3   Title  demonstrate Rt knee A/ROM flexion to > or = to 95 degrees to allow for sitting and walking without substitution    Baseline  94-97 degrees    Status  Achieved      PT SHORT TERM GOAL #4   Title  reduce Rt knee pain to allow for walking > 10 minutes without limitation    Baseline  10 minutes with cane in the community    Status  Achieved        PT Long Term Goals - 08/19/17 0931      PT LONG TERM GOAL #1   Title  be independent in advanced HEP    Time  8    Period  Weeks    Status  On-going      PT LONG TERM GOAL #4   Title  improve Rt knee and hip strength to wean from walker to use of cane for all distances with safety    Baseline  no longer using walker.  Using quad cane for all distances    Status  Achieved      PT LONG TERM GOAL #6   Title  improve strength and reduce Rt knee pain to allow for walking 30 minutes without limitation to improve independence in the community    Baseline  10 minutes max    Time  8    Period  Weeks    Status  On-going            Plan - 08/19/17 0942    Clinical Impression Statement  Pt is making steady progress toward goals s/p  Rt TKA.  Pt with limited Rt knee A/ROM that is improving.  Pt with altered gait pattern with ER at Rt hip with gait and reduced heel strike due to reduced knee extension.  Pt is able to walk ~10 minutes and is limited by pain and endurance limitations.  Pt requires frequent verbal cues with leg press and with gait for neutral hip.  Pt reports up to 6/10 Rt knee pain at the end of the day.  Pt will continue to benefit from skilled PT for Rt knee A/ROM, flexibility, strength and gait training to allow for independence with  community function, normalized gait and for edema and pain management.      Rehab Potential  Excellent    PT Frequency  3x / week    PT Duration  8 weeks    PT Treatment/Interventions  ADLs/Self Care Home Management;Cryotherapy;Electrical Stimulation;Moist Heat;Gait training;Stair training;Functional mobility training;Therapeutic activities;Therapeutic exercise;Balance training;Patient/family education;Neuromuscular re-education;Manual techniques;Passive range of motion;Taping;Vasopneumatic Device    PT Next Visit Plan  Rt knee A/ROM, strength, edema management and gait.    PT Home Exercise Plan  access code:  [email or web address]     Recommended Other Services  initial certification is signed    Consulted and Agree with Plan of Care  Patient       Patient will benefit from skilled therapeutic intervention in order to improve the following deficits and impairments:  Pain, Abnormal gait, Decreased mobility, Decreased activity tolerance, Decreased endurance, Decreased range of motion, Decreased strength, Impaired flexibility, Increased edema, Difficulty walking  Visit Diagnosis: Acute pain of right knee  Localized edema  Muscle weakness (generalized)  Stiffness of right knee, not elsewhere classified  Other abnormalities of gait and mobility     Problem List Patient Active Problem List   Diagnosis Date Noted  . Arthritis of right knee 07/12/2017  . Unilateral primary osteoarthritis, right knee 06/21/2017     Sigurd Sos, PT 08/19/17 10:18 AM   [facility] [address], [place], [place], [address] Phone: [phone]   Fax:  [phone]  Name: [name] MRN: [id number] Date of Birth: [birth year]

## 2017-08-21 ENCOUNTER — Encounter: Payer: Self-pay | Admitting: Physical Therapy

## 2017-08-21 ENCOUNTER — Ambulatory Visit: Payer: BLUE CROSS/BLUE SHIELD | Admitting: Physical Therapy

## 2017-08-21 DIAGNOSIS — M6281 Muscle weakness (generalized): Secondary | ICD-10-CM

## 2017-08-21 DIAGNOSIS — R2689 Other abnormalities of gait and mobility: Secondary | ICD-10-CM

## 2017-08-21 DIAGNOSIS — M25561 Pain in right knee: Secondary | ICD-10-CM | POA: Diagnosis not present

## 2017-08-21 DIAGNOSIS — R600 Localized edema: Secondary | ICD-10-CM

## 2017-08-21 DIAGNOSIS — M25661 Stiffness of right knee, not elsewhere classified: Secondary | ICD-10-CM

## 2017-08-21 NOTE — Therapy (Signed)
[facility] [address] [place], [place], [address] Phone: [phone]   Fax:  [phone]  Physical Therapy Treatment  Patient Details  Name: [name] MRN: [id number] Date of Birth: 03/28/1956 Referring Provider: Rodell Perna, MD   Encounter Date: 08/21/2017  PT End of Session - 08/21/17 0934    Visit Number  11    Date for PT Re-Evaluation  09/23/17    Authorization Type  BCBS    PT Start Time  0919    PT Stop Time  1030    PT Time Calculation (min)  71 min    Activity Tolerance  Patient tolerated treatment well    Behavior During Therapy  [facility] for tasks assessed/performed       Past Medical History:  Diagnosis Date  . Acid reflux   . Arthritis   . Depression   . Glaucoma   . Hypertension     Past Surgical History:  Procedure Laterality Date  . ANKLE FRACTURE SURGERY     states broken leg not ankle  . COLONOSCOPY    . KNEE SURGERY    . TOTAL KNEE ARTHROPLASTY Right 07/12/2017   Procedure: RIGHT TOTAL KNEE ARTHROPLASTY -CEMENTED;  Surgeon: Marybelle Killings, MD;  Location: [place];  Service: Orthopedics;  Laterality: Right;    There were no vitals filed for this visit.  Subjective Assessment - 08/21/17 0936    Subjective  I had a great day yesterday, no pain meds needed. Still tough around 5 pm.    Pertinent History  Rt TKA: 07/12/17    Limitations  Standing;Walking    Currently in Pain?  No/denies Just feels tight from swelling                       [facility] Adult PT Treatment/Exercise - 08/21/17 0001      Ambulation/Gait   Ambulation Distance (Feet)  -- > 300 feet    Assistive device  -- None    Gait Comments  Outdoors: unlevel, curbs, inclines and declines no device      Knee/Hip Exercises: Stretches   Active Hamstring Stretch  Right;3 reps;20 seconds Seated    Knee: Self-Stretch to increase Flexion  -- 20x rocking on second step, then static holds 3x 20 sec    Gastroc Stretch   Right;3 reps;20 seconds On slant board, VC for corretc position, VC to stand erect      Knee/Hip Exercises: Aerobic   Stationary Bike  L0 x 6 min    Nustep  L2 x 5 min PTA present      Knee/Hip Exercises: Machines for Strengthening   Total Gym Leg Press  Seat 5: Bil 55# 10x, 60# 10x RTLE 40# 2x10  VC for RT hip      Knee/Hip Exercises: Standing   Forward Step Up  Right;2 sets;15 reps;Hand Hold: 2;Step Height: 6"    Rebounder  weight shifting 3 ways 3x1 minute VC for position of RT foot, tends to evert      Knee/Hip Exercises: Seated   Long Arc Quad  Strengthening;Right;3 sets;10 reps;Weights    Long Arc Quad Weight  4 lbs.      Control and instrumentation engineer  IF[employer]    Electrical Stimulation Parameters  80-150 HZ    Electrical Stimulation Goals  Pain      Vasopneumatic   Number Minutes Vasopneumatic  15 minutes    Vasopnuematic Location   Knee    Vasopneumatic Pressure  Medium    Vasopneumatic Temperature   3 snowflakes      Manual Therapy   Manual Therapy  Myofascial release;Soft tissue mobilization    Manual therapy comments  soft tissue elongation to Rt distal quads and hamstrings and PROM into flexion and extension.  Patellar mobs               PT Short Term Goals - 08/19/17 0930      PT SHORT TERM GOAL #1   Title  be independent in initial HEP    Status  Achieved      PT SHORT TERM GOAL #2   Title  demonstrate Rt knee A/ROM extension to lacking < or = to 10 degrees to normalize gait pattern    Baseline  10 degrees    Status  Achieved      PT SHORT TERM GOAL #3   Title  demonstrate Rt knee A/ROM flexion to > or = to 95 degrees to allow for sitting and walking without substitution    Baseline  94-97 degrees    Status  Achieved      PT SHORT TERM GOAL #4   Title  reduce Rt knee pain to allow for walking > 10 minutes without limitation    Baseline  10 minutes with cane in the community     Status  Achieved        PT Long Term Goals - 08/19/17 0931      PT LONG TERM GOAL #1   Title  be independent in advanced HEP    Time  8    Period  Weeks    Status  On-going      PT LONG TERM GOAL #4   Title  improve Rt knee and hip strength to wean from walker to use of cane for all distances with safety    Baseline  no longer using walker.  Using quad cane for all distances    Status  Achieved      PT LONG TERM GOAL #6   Title  improve strength and reduce Rt knee pain to allow for walking 30 minutes without limitation to improve independence in the community    Baseline  10 minutes max    Time  8    Period  Weeks    Status  On-going            Plan - 08/21/17 0934    Clinical Impression Statement  Pt verbally reports today evryday his knee feels better and better, in fact he required no pain medication at all yesterday up until 5pm when he typically needs some. Sleeping is also 75% improved. We began the process of weaning off the quad cane for outdoor distances. Pt still negotiates steps step to step gatit pattern in both direction.      Rehab Potential  Excellent    PT Frequency  3x / week    PT Duration  8 weeks    PT Treatment/Interventions  ADLs/Self Care Home Management;Cryotherapy;Electrical Stimulation;Moist Heat;Gait training;Stair training;Functional mobility training;Therapeutic activities;Therapeutic exercise;Balance training;Patient/family education;Neuromuscular re-education;Manual techniques;Passive range of motion;Taping;Vasopneumatic Device    PT Next Visit Plan  Rt knee A/ROM, strength, edema management and gait.    PT Home Exercise Plan  access code:  [email or web address]     Consulted and Agree with Plan of Care  Patient       Patient will benefit  from skilled therapeutic intervention in order to improve the following deficits and impairments:  Pain, Abnormal gait, Decreased mobility, Decreased activity tolerance, Decreased endurance, Decreased range of motion,  Decreased strength, Impaired flexibility, Increased edema, Difficulty walking  Visit Diagnosis: Acute pain of right knee  Localized edema  Muscle weakness (generalized)  Stiffness of right knee, not elsewhere classified  Other abnormalities of gait and mobility     Problem List Patient Active Problem List   Diagnosis Date Noted  . Arthritis of right knee 07/12/2017  . Unilateral primary osteoarthritis, right knee 06/21/2017    COCHRAN,JENNIFER, PTA 08/21/2017, 10:14 AM  [facility] [address] [place], [place], [address] Phone: [phone]   Fax:  [phone]  Name: [name] MRN: [id number] Date of Birth: July 12, 1955

## 2017-08-23 ENCOUNTER — Encounter: Payer: Self-pay | Admitting: Physical Therapy

## 2017-08-23 ENCOUNTER — Ambulatory Visit: Payer: BLUE CROSS/BLUE SHIELD | Admitting: Physical Therapy

## 2017-08-23 DIAGNOSIS — R600 Localized edema: Secondary | ICD-10-CM

## 2017-08-23 DIAGNOSIS — M25561 Pain in right knee: Secondary | ICD-10-CM | POA: Diagnosis not present

## 2017-08-23 DIAGNOSIS — R2689 Other abnormalities of gait and mobility: Secondary | ICD-10-CM

## 2017-08-23 DIAGNOSIS — M6281 Muscle weakness (generalized): Secondary | ICD-10-CM

## 2017-08-23 DIAGNOSIS — M25661 Stiffness of right knee, not elsewhere classified: Secondary | ICD-10-CM

## 2017-08-23 NOTE — Therapy (Signed)
[facility] [address] [place], [place], [address] Phone: [phone]   Fax:  [phone]  Physical Therapy Treatment  Patient Details  Name: [name] MRN: [id number] Date of Birth: 1956-01-16 Referring Provider: Rodell Perna, MD   Encounter Date: 08/23/2017  PT End of Session - 08/23/17 0942    Visit Number  12    Date for PT Re-Evaluation  09/23/17    Authorization Type  BCBS    PT Start Time  0920    PT Stop Time  1035    PT Time Calculation (min)  75 min    Activity Tolerance  Patient tolerated treatment well    Behavior During Therapy  [facility] for tasks assessed/performed       Past Medical History:  Diagnosis Date  . Acid reflux   . Arthritis   . Depression   . Glaucoma   . Hypertension     Past Surgical History:  Procedure Laterality Date  . ANKLE FRACTURE SURGERY     states broken leg not ankle  . COLONOSCOPY    . KNEE SURGERY    . TOTAL KNEE ARTHROPLASTY Right 07/12/2017   Procedure: RIGHT TOTAL KNEE ARTHROPLASTY -CEMENTED;  Surgeon: Marybelle Killings, MD;  Location: [place];  Service: Orthopedics;  Laterality: Right;    There were no vitals filed for this visit.  Subjective Assessment - 08/23/17 0943    Subjective  Had a lot of pain last night about 2 am, better this AM. Walking without a device.     Currently in Pain?  No/denies    Multiple Pain Sites  No         [facility] PT Assessment - 08/23/17 0001      AROM   Right Knee Extension  5    Right Knee Flexion  105                   [facility] Adult PT Treatment/Exercise - 08/23/17 0001      Knee/Hip Exercises: Stretches   Active Hamstring Stretch  Right;3 reps;20 seconds Seated    Knee: Self-Stretch to increase Flexion  -- 20x rocking on second step, then static holds 3x 20 sec    Gastroc Stretch  Right;3 reps;20 seconds On slant board, VC for corretc position, VC to stand erect      Knee/Hip Exercises: Aerobic   Stationary  Bike  L0 x 6 min pt able to reduce his hip hike significantly today    Nustep  L2 x 10 min PTA present to monitor      Knee/Hip Exercises: Machines for Strengthening   Total Gym Leg Press  Seat 5: 60# Bil 2x15, RTLE 40# 2x15 Ball squeeze      Knee/Hip Exercises: Standing   Stairs  4x with rails, reciprocally. Slow and cautious going down    SLS  10 sec level surface 3x    Rebounder  weight shifting 3 ways 3x1 minute VC for position of RT foot, tends to evert      Knee/Hip Exercises: Seated   Long Arc Quad  Strengthening;Right;3 sets;10 reps;Weights    Long Arc Quad Weight  4 lbs.      Control and instrumentation engineer  IFC    Electrical Stimulation Parameters  80-150 HZ    Electrical Stimulation Goals  Pain      Vasopneumatic   Number Minutes Vasopneumatic  15 minutes    Vasopnuematic Location   Knee    Vasopneumatic Pressure  Medium    Vasopneumatic Temperature   3 snowflakes      Manual Therapy   Manual Therapy  Myofascial release;Soft tissue mobilization    Manual therapy comments  soft tissue elongation to Rt distal quads and hamstrings and PROM into flexion and extension.  Patellar mobs             PT Education - 08/23/17 1000    Education provided  Yes    Education Details  Single leg balance    Person(s) Educated  Patient    Methods  Explanation;Demonstration;Tactile cues;Verbal cues    Comprehension  Returned demonstration;Verbalized understanding       PT Short Term Goals - 08/19/17 0930      PT SHORT TERM GOAL #1   Title  be independent in initial HEP    Status  Achieved      PT SHORT TERM GOAL #2   Title  demonstrate Rt knee A/ROM extension to lacking < or = to 10 degrees to normalize gait pattern    Baseline  10 degrees    Status  Achieved      PT SHORT TERM GOAL #3   Title  demonstrate Rt knee A/ROM flexion to > or = to 95 degrees to allow for sitting and walking without  substitution    Baseline  94-97 degrees    Status  Achieved      PT SHORT TERM GOAL #4   Title  reduce Rt knee pain to allow for walking > 10 minutes without limitation    Baseline  10 minutes with cane in the community    Status  Achieved        PT Long Term Goals - 08/23/17 1019      PT LONG TERM GOAL #3   Title  improve Rt knee A/ROM extension to lacking < or = to 5 degrees to normalize gait pattern    Time  8    Period  Weeks    Status  Achieved      PT LONG TERM GOAL #5   Title  demonstrate > or = to 120 degrees of Rt knee flexion to improve mobility, gait and squatting    Time  8    Period  Weeks    Status  On-going            Plan - 08/23/17 8338    Clinical Impression Statement  Pt now able to ambulate all surfaces without an assistive device. AROM end of session was 5 -105 in supine. Pt met long term goal for extension. Pt improving with stairs but still limited especially going down.     Rehab Potential  Excellent    PT Frequency  3x / week    PT Duration  8 weeks    PT Treatment/Interventions  ADLs/Self Care Home Management;Cryotherapy;Electrical Stimulation;Moist Heat;Gait training;Stair training;Functional mobility training;Therapeutic activities;Therapeutic exercise;Balance training;Patient/family education;Neuromuscular re-education;Manual techniques;Passive range of motion;Taping;Vasopneumatic Device    PT Next Visit Plan  Rt knee A/ROM, strength, edema management and gait. Work on stairs.    PT Home Exercise Plan  access code:  [email or web address]     Consulted and Agree with Plan of Care  Patient       Patient will benefit from skilled therapeutic intervention in order to improve the following deficits and impairments:  Pain, Abnormal gait, Decreased mobility, Decreased activity tolerance, Decreased endurance, Decreased range of  motion, Decreased strength, Impaired flexibility, Increased edema, Difficulty walking  Visit Diagnosis: Acute pain of right  knee  Localized edema  Muscle weakness (generalized)  Stiffness of right knee, not elsewhere classified  Other abnormalities of gait and mobility     Problem List Patient Active Problem List   Diagnosis Date Noted  . Arthritis of right knee 07/12/2017  . Unilateral primary osteoarthritis, right knee 06/21/2017    Nicholas Boatman, PTA 08/23/2017, 10:23 AM  [facility] [address] [place], [place], [address] Phone: [phone]   Fax:  [phone]  Name: MYLES TAVELLA MRN: [id number] Date of Birth: 1956/02/20

## 2017-08-26 ENCOUNTER — Encounter: Payer: Self-pay | Admitting: Physical Therapy

## 2017-08-26 ENCOUNTER — Ambulatory Visit: Payer: BLUE CROSS/BLUE SHIELD | Admitting: Physical Therapy

## 2017-08-26 DIAGNOSIS — R600 Localized edema: Secondary | ICD-10-CM

## 2017-08-26 DIAGNOSIS — M25661 Stiffness of right knee, not elsewhere classified: Secondary | ICD-10-CM

## 2017-08-26 DIAGNOSIS — R2689 Other abnormalities of gait and mobility: Secondary | ICD-10-CM

## 2017-08-26 DIAGNOSIS — M6281 Muscle weakness (generalized): Secondary | ICD-10-CM

## 2017-08-26 DIAGNOSIS — M25561 Pain in right knee: Secondary | ICD-10-CM | POA: Diagnosis not present

## 2017-08-26 NOTE — Therapy (Signed)
[facility] [address] [place], [place], [address] Phone: [phone]   Fax:  [phone]  Physical Therapy Treatment  Patient Details  Name: [name] MRN: [id number] Date of Birth: 1956/01/30 Referring Provider: Rodell Perna, MD   Encounter Date: 08/26/2017  PT End of Session - 08/26/17 0923    Visit Number  13    Date for PT Re-Evaluation  09/23/17    Authorization Type  BCBS    PT Start Time  0922    PT Stop Time  1030    PT Time Calculation (min)  68 min    Activity Tolerance  Patient tolerated treatment well    Behavior During Therapy  [facility] for tasks assessed/performed       Past Medical History:  Diagnosis Date  . Acid reflux   . Arthritis   . Depression   . Glaucoma   . Hypertension     Past Surgical History:  Procedure Laterality Date  . ANKLE FRACTURE SURGERY     states broken leg not ankle  . COLONOSCOPY    . KNEE SURGERY    . TOTAL KNEE ARTHROPLASTY Right 07/12/2017   Procedure: RIGHT TOTAL KNEE ARTHROPLASTY -CEMENTED;  Surgeon: Marybelle Killings, MD;  Location: [place];  Service: Orthopedics;  Laterality: Right;    There were no vitals filed for this visit.  Subjective Assessment - 08/26/17 0924    Subjective  No new complaints.     Pertinent History  Rt TKA: 07/12/17    Currently in Pain?  No/denies    Multiple Pain Sites  No         [facility] PT Assessment - 08/26/17 0001      AROM   Right Knee Extension  5    Right Knee Flexion  108                   [facility] Adult PT Treatment/Exercise - 08/26/17 0001      Knee/Hip Exercises: Stretches   Active Hamstring Stretch  Right;3 reps;20 seconds Seated    Knee: Self-Stretch to increase Flexion  -- 20x rocking on second step, then static holds 3x 20 sec    Gastroc Stretch  Right;3 reps;20 seconds On slant board, VC for corretc position, VC to stand erect      Knee/Hip Exercises: Aerobic   Stationary Bike  L1 x 7 min pt able  to reduce his hip hike significantly today    Nustep  L2 x 10 min PTA present to monitor      Knee/Hip Exercises: Machines for Strengthening   Total Gym Leg Press  Seat 5: 60# Bil 2x15, RTLE 40# 2x15 Ball squeeze      Knee/Hip Exercises: Standing   Forward Step Up  Right;1 set;15 reps;Hand Hold: 2;Step Height: 4" Then up the stairs 3x      Knee/Hip Exercises: Seated   Long Arc Quad  Strengthening;Right;3 sets;10 reps;Weights    Long Arc Quad Weight  4 lbs.      Control and instrumentation engineer  IFC    Electrical Stimulation Parameters  80-150 HZ    Electrical Stimulation Goals  Pain      Vasopneumatic   Number Minutes Vasopneumatic   15 minutes    Vasopnuematic Location   Knee    Vasopneumatic Pressure  Medium    Vasopneumatic Temperature   3 snowflakes  Manual Therapy   Manual Therapy  Myofascial release;Soft tissue mobilization    Manual therapy comments  soft tissue elongation to Rt distal quads and hamstrings and PROM into flexion and extension.  Patellar mobs               PT Short Term Goals - 08/26/17 1610      PT SHORT TERM GOAL #1   Title  be independent in initial HEP    Time  4    Period  Weeks    Status  Achieved      PT SHORT TERM GOAL #2   Title  demonstrate Rt knee A/ROM extension to lacking < or = to 10 degrees to normalize gait pattern    Baseline  10 degrees    Time  4    Period  Weeks    Status  Achieved      PT SHORT TERM GOAL #3   Title  demonstrate Rt knee A/ROM flexion to > or = to 95 degrees to allow for sitting and walking without substitution    Time  4    Period  Weeks    Status  Achieved      PT SHORT TERM GOAL #4   Title  reduce Rt knee pain to allow for walking > 10 minutes without limitation    Time  4    Period  Weeks    Status  Achieved        PT Long Term Goals - 08/23/17 1019      PT LONG TERM GOAL #3   Title  improve Rt knee A/ROM extension  to lacking < or = to 5 degrees to normalize gait pattern    Time  8    Period  Weeks    Status  Achieved      PT LONG TERM GOAL #5   Title  demonstrate > or = to 120 degrees of Rt knee flexion to improve mobility, gait and squatting    Time  8    Period  Weeks    Status  On-going            Plan - 08/26/17 9604    Clinical Impression Statement  Pt reports he is increasing the length of time he is up on his feet. More than likely why he conitnues to throb and ache at night. Pt does not need an assistive device at his time and will conitnue to work on increasing his time on his feet, currently he can be up 10-15 minutes before needing to sit.  AROM at end of session today is 5-108 in supine.     Rehab Potential  Excellent    PT Frequency  3x / week    PT Duration  8 weeks    PT Treatment/Interventions  ADLs/Self Care Home Management;Cryotherapy;Electrical Stimulation;Moist Heat;Gait training;Stair training;Functional mobility training;Therapeutic activities;Therapeutic exercise;Balance training;Patient/family education;Neuromuscular re-education;Manual techniques;Passive range of motion;Taping;Vasopneumatic Device    PT Next Visit Plan  To MD tomorrow, route note    PT Home Exercise Plan  access code:  [email or web address]     Consulted and Agree with Plan of Care  Patient       Patient will benefit from skilled therapeutic intervention in order to improve the following deficits and impairments:  Pain, Abnormal gait, Decreased mobility, Decreased activity tolerance, Decreased endurance, Decreased range of motion, Decreased strength, Impaired flexibility, Increased edema, Difficulty walking  Visit Diagnosis: Acute pain of right knee  Localized edema  Muscle  weakness (generalized)  Stiffness of right knee, not elsewhere classified  Other abnormalities of gait and mobility     Problem List Patient Active Problem List   Diagnosis Date Noted  . Arthritis of right knee 07/12/2017  .  Unilateral primary osteoarthritis, right knee 06/21/2017    Trinidad Petron, PTA 08/26/2017, 10:17 AM  [facility] [address] [place], [place], [address] Phone: [phone]   Fax:  [phone]  Name: [name] MRN: [id number] Date of Birth: April 22, 1955

## 2017-08-27 ENCOUNTER — Ambulatory Visit (INDEPENDENT_AMBULATORY_CARE_PROVIDER_SITE_OTHER): Payer: BLUE CROSS/BLUE SHIELD | Admitting: Orthopaedic Surgery

## 2017-08-27 ENCOUNTER — Encounter (INDEPENDENT_AMBULATORY_CARE_PROVIDER_SITE_OTHER): Payer: Self-pay | Admitting: Orthopaedic Surgery

## 2017-08-27 VITALS — BP 152/95 | HR 72 | Ht 63.0 in | Wt 211.0 lb

## 2017-08-27 DIAGNOSIS — Z96651 Presence of right artificial knee joint: Secondary | ICD-10-CM

## 2017-08-27 NOTE — Progress Notes (Signed)
   Post-Op Visit Note   Patient: [name]           Date of Birth: 1955-08-30           MRN: [id number] Visit Date: 08/27/2017 PCP: Chesley Noon, MD   Assessment & Plan:  Chief Complaint:  Chief Complaint  Patient presents with  . Right Knee - Follow-up   Visit Diagnoses:  1. Hx of total knee arthroplasty, right     Plan: 6 weeks post right total knee arthroplasty.  He has not worked in several years is been looking for jobs.  Postop x-rays look good and he still has quad weakness although he can do a straight leg raise.  He is unable to do a single stance step up with his right leg at this point and needs to continue working on home quad exercises to strengthen his right quad to improve his ambulation.  Still looking for other job opportunities.  He had an episode where he fell landing on his elbow and had some pain in his left acromioclavicular joint but this seems to be getting better.  He can get his arm up over his head has good cervical range of motion.  He has been progressing with physical therapy with flexion and extension but still needs to work on quad strengthening.  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  Recheck 2 months.  No x-ray needed on return.  Follow-Up Instructions: Return in about 2 months (around 10/27/2017).   Orders:  No orders of the defined types were placed in this encounter.  No orders of the defined types were placed in this encounter.   Imaging: No results found.  PMFS History: Patient Active Problem List   Diagnosis Date Noted  . Arthritis of right knee 07/12/2017  . Unilateral primary osteoarthritis, right knee 06/21/2017   Past Medical History:  Diagnosis Date  . Acid reflux   . Arthritis   . Depression   . Glaucoma   . Hypertension     Family History  Problem Relation Age of Onset  . Bladder Cancer Mother   . Coronary artery disease Mother   . Heart disease Mother   . Stroke Father   .  Alcoholism Father     Past Surgical History:  Procedure Laterality Date  . ANKLE FRACTURE SURGERY     states broken leg not ankle  . COLONOSCOPY    . KNEE SURGERY    . TOTAL KNEE ARTHROPLASTY Right 07/12/2017   Procedure: RIGHT TOTAL KNEE ARTHROPLASTY -CEMENTED;  Surgeon: Marybelle Killings, MD;  Location: [place];  Service: Orthopedics;  Laterality: Right;   Social History   Occupational History  . Not on file  Tobacco Use  . Smoking status: Former Smoker    Last attempt to quit: 12/19/1999    Years since quitting: 17.7  . Smokeless tobacco: Never Used  Substance and Sexual Activity  . Alcohol use: Yes    Frequency: Never    Comment: 7-14 day  . Drug use: No  . Sexual activity: Not on file

## 2017-08-28 ENCOUNTER — Ambulatory Visit: Payer: BLUE CROSS/BLUE SHIELD

## 2017-08-28 DIAGNOSIS — R600 Localized edema: Secondary | ICD-10-CM

## 2017-08-28 DIAGNOSIS — R2689 Other abnormalities of gait and mobility: Secondary | ICD-10-CM

## 2017-08-28 DIAGNOSIS — M25661 Stiffness of right knee, not elsewhere classified: Secondary | ICD-10-CM

## 2017-08-28 DIAGNOSIS — M25561 Pain in right knee: Secondary | ICD-10-CM | POA: Diagnosis not present

## 2017-08-28 DIAGNOSIS — M6281 Muscle weakness (generalized): Secondary | ICD-10-CM

## 2017-08-28 NOTE — Patient Instructions (Signed)
Access Code: [email or web address]  URL: [email or web address]  Date: 08/28/2017  Prepared by: Sigurd Sos   Exercises  Supine Knee Extension Stretch on Towel Roll - 5 Reps - 2 Sets - 10 Hold - 3x daily - 7x weekly  Supine Heel Slide with Strap - 10 Reps - 2 Sets - 10 Hold - 2x daily - 7x weekly  Seated Ankle Dorsiflexion Stretch - 10 reps - 2 sets - 20 hold - 1x daily - 7x weekly  Seated Long Arc Quad - 10 Reps - 2 Sets - 5 Hold - 1x daily - 7x weekly  Supine Quad Set - 10 reps - 2 sets - 5 hold - 3x daily - 7x weekly  Step Up - 10 reps - 2 sets - 2x daily - 7x weekly  Standing Single Leg Stance with Unilateral Counter Support - 5 reps - 20 hold - 2x daily - 7x weekly  Standing Hip Abduction - 10 reps - 2 sets - 2x daily - 7x weekly  Standing Hip Extension - 10 reps - 2 sets - 2x daily - 7x weekly  Sit to Stand without Arm Support - 10 reps - 2 sets - 2x daily - 7x weekly

## 2017-08-28 NOTE — Therapy (Signed)
[facility] [address] [place], [place], [address] Phone: [phone]   Fax:  [phone]  Physical Therapy Treatment  Patient Details  Name: [name] MRN: [id number] Date of Birth: 1955/04/28 Referring Provider: Rodell Perna, MD   Encounter Date: 08/28/2017  PT End of Session - 08/28/17 1005    Visit Number  14    Date for PT Re-Evaluation  09/23/17    Authorization Type  BCBS    PT Start Time  0924    PT Stop Time  1014    PT Time Calculation (min)  50 min    Activity Tolerance  Patient tolerated treatment well    Behavior During Therapy  [facility] for tasks assessed/performed       Past Medical History:  Diagnosis Date  . Acid reflux   . Arthritis   . Depression   . Glaucoma   . Hypertension     Past Surgical History:  Procedure Laterality Date  . ANKLE FRACTURE SURGERY     states broken leg not ankle  . COLONOSCOPY    . KNEE SURGERY    . TOTAL KNEE ARTHROPLASTY Right 07/12/2017   Procedure: RIGHT TOTAL KNEE ARTHROPLASTY -CEMENTED;  Surgeon: Marybelle Killings, MD;  Location: [place];  Service: Orthopedics;  Laterality: Right;    There were no vitals filed for this visit.  Subjective Assessment - 08/28/17 0931    Subjective  I saw the MD yesterday and he wants me to be done with PT this week.      Pertinent History  Rt TKA: 07/12/17    Patient Stated Goals  wean from walker, improve Rt knee strength and flexibility, return to prior level of funtion    Currently in Pain?  No/denies         [facility] PT Assessment - 08/28/17 0001      Assessment   Medical Diagnosis  Rt knee OA, s/p TKA      Observation/Other Assessments   Focus on Therapeutic Outcomes (FOTO)   50% limitation                   [facility] Adult PT Treatment/Exercise - 08/28/17 0001      Knee/Hip Exercises: Stretches   Active Hamstring Stretch  Right;3 reps;20 seconds Seated      Knee/Hip Exercises: Aerobic   Stationary Bike   L1 x 7 min pt able to reduce his hip hike significantly today    Nustep  L2 x 10 min PTA present to monitor      Knee/Hip Exercises: Machines for Strengthening   Total Gym Leg Press  Seat 5: 60# Bil 2x15, RTLE 40# 2x15 Ball squeeze      Knee/Hip Exercises: Standing   Hip Abduction  Stengthening;Both;2 sets;10 reps    Hip Extension  Stengthening;Both;2 sets;10 reps    Forward Step Up  Right;1 set;15 reps;Hand Hold: 2;Step Height: 6" Then up the stairs 3x      Knee/Hip Exercises: Seated   Long Arc Quad  Strengthening;Right;3 sets;10 reps;Weights    Long Arc Quad Weight  4 lbs.    Sit to Sand  20 reps;without UE support             PT Education - 08/28/17 1005    Education provided  Yes    Education Details   Access Code: [email or web address]     Person(s) Educated  Patient    Methods  Explanation;Handout;Demonstration    Comprehension  Verbalized  understanding;Returned demonstration       PT Short Term Goals - 08/26/17 0942      PT SHORT TERM GOAL #1   Title  be independent in initial HEP    Time  4    Period  Weeks    Status  Achieved      PT SHORT TERM GOAL #2   Title  demonstrate Rt knee A/ROM extension to lacking < or = to 10 degrees to normalize gait pattern    Baseline  10 degrees    Time  4    Period  Weeks    Status  Achieved      PT SHORT TERM GOAL #3   Title  demonstrate Rt knee A/ROM flexion to > or = to 95 degrees to allow for sitting and walking without substitution    Time  4    Period  Weeks    Status  Achieved      PT SHORT TERM GOAL #4   Title  reduce Rt knee pain to allow for walking > 10 minutes without limitation    Time  4    Period  Weeks    Status  Achieved        PT Long Term Goals - 08/28/17 3235      PT LONG TERM GOAL #2   Title  reduce FOTO to < or = to 40% limitation    Baseline  50% limitation    Status  Not Met      PT LONG TERM GOAL #3   Title  improve Rt knee A/ROM extension to lacking < or = to 5 degrees to normalize gait  pattern    Status  Achieved      PT LONG TERM GOAL #4   Title  improve Rt knee and hip strength to wean from walker to use of cane for all distances with safety    Status  Achieved      PT LONG TERM GOAL #6   Title  improve strength and reduce Rt knee pain to allow for walking 30 minutes without limitation to improve independence in the community    Baseline  15 minutes- haven't challenged this    Time  8    Period  Weeks    Status  On-going            Plan - 08/28/17 5732    Clinical Impression Statement  Pt saw MD yesterday and he wants pt to D/C at the end of this week.  Session was spent building HEP for strength, functional mobility and stability of the Rt knee.  Pt with quad weakness an dreduction functional strength in addition to limited Rt knee A/ROM.  PT stressed importance of compliance with HEP for continued gains.  Frequent tactile and verbal cues for technique with exercises today.      Rehab Potential  Excellent    PT Frequency  3x / week    PT Duration  8 weeks    PT Treatment/Interventions  ADLs/Self Care Home Management;Cryotherapy;Electrical Stimulation;Moist Heat;Gait training;Stair training;Functional mobility training;Therapeutic activities;Therapeutic exercise;Balance training;Patient/family education;Neuromuscular re-education;Manual techniques;Passive range of motion;Taping;Vasopneumatic Device    PT Next Visit Plan  1 more session per MD.  Review HEP for strength.  D/C FOTO.      PT Home Exercise Plan  access code:  [email or web address]     Consulted and Agree with Plan of Care  Patient       Patient will benefit from skilled therapeutic  intervention in order to improve the following deficits and impairments:  Pain, Abnormal gait, Decreased mobility, Decreased activity tolerance, Decreased endurance, Decreased range of motion, Decreased strength, Impaired flexibility, Increased edema, Difficulty walking  Visit Diagnosis: Acute pain of right knee  Localized  edema  Muscle weakness (generalized)  Stiffness of right knee, not elsewhere classified  Other abnormalities of gait and mobility     Problem List There are no active problems to display for this patient.   Sigurd Sos, PT 08/28/17 10:11 AM  [facility] [address], [place], [place], [address] Phone: [phone]   Fax:  [phone]  Name: [name] MRN: [id number] Date of Birth: [birth year]

## 2017-08-30 ENCOUNTER — Encounter: Payer: Self-pay | Admitting: Physical Therapy

## 2017-08-30 ENCOUNTER — Ambulatory Visit: Payer: BLUE CROSS/BLUE SHIELD | Admitting: Physical Therapy

## 2017-08-30 DIAGNOSIS — M25561 Pain in right knee: Secondary | ICD-10-CM

## 2017-08-30 DIAGNOSIS — M6281 Muscle weakness (generalized): Secondary | ICD-10-CM

## 2017-08-30 DIAGNOSIS — M25661 Stiffness of right knee, not elsewhere classified: Secondary | ICD-10-CM

## 2017-08-30 DIAGNOSIS — R600 Localized edema: Secondary | ICD-10-CM

## 2017-08-30 DIAGNOSIS — R2689 Other abnormalities of gait and mobility: Secondary | ICD-10-CM

## 2017-08-30 NOTE — Therapy (Addendum)
[facility] [address], [place], [place], [address] Phone: [phone]   Fax:  [phone]  Physical Therapy Treatment  Patient Details  Name: [name] MRN: [id number] Date of Birth: May 28, 1955 Referring Provider: Rodell Perna, MD   Encounter Date: 08/30/2017  PT End of Session - 08/30/17 0928    Visit Number  15    Date for PT Re-Evaluation  09/23/17    Authorization Type  BCBS    PT Start Time  0915    PT Stop Time  1015    PT Time Calculation (min)  60 min    Activity Tolerance  Patient tolerated treatment well    Behavior During Therapy  [facility] for tasks assessed/performed       Past Medical History:  Diagnosis Date  . Acid reflux   . Arthritis   . Depression   . Glaucoma   . Hypertension     Past Surgical History:  Procedure Laterality Date  . ANKLE FRACTURE SURGERY     states broken leg not ankle  . COLONOSCOPY    . KNEE SURGERY    . TOTAL KNEE ARTHROPLASTY Right 07/12/2017   Procedure: RIGHT TOTAL KNEE ARTHROPLASTY -CEMENTED;  Surgeon: Marybelle Killings, MD;  Location: [place];  Service: Orthopedics;  Laterality: Right;    There were no vitals filed for this visit.  Subjective Assessment - 08/30/17 0934    Subjective  I had a pop on the bike last time that really hurt. Is that ok?    Pertinent History  Rt TKA: 07/12/17    Currently in Pain?  Yes    Pain Score  2     Pain Location  Knee    Pain Orientation  Right    Pain Descriptors / Indicators  Sore    Aggravating Factors   Last time on bike    Pain Relieving Factors  ice, meds    Multiple Pain Sites  No         [facility] PT Assessment - 08/30/17 0001      Observation/Other Assessments   Focus on Therapeutic Outcomes (FOTO)   52% limitation      AROM   Right Knee Extension  5    Right Knee Flexion  110                   [facility] Adult PT Treatment/Exercise - 08/30/17 0001      Knee/Hip Exercises: Aerobic   Stationary Bike   L0-1 x 5 min Pt was afraid , encouraged pt to not be afraid    Nustep  L2 x 10 min PTA present to monitor      Knee/Hip Exercises: Machines for Strengthening   Total Gym Leg Press  Seat 5 65# 2x 15      Knee/Hip Exercises: Standing   Forward Step Up  Right;2 sets;10 reps;Hand Hold: 2;Step Height: 6"    Step Down  Right;2 sets;10 reps;Step Height: 2";Step Height: 6" VC to not turn trunk sideways.      Knee/Hip Exercises: Seated   Long Arc Quad  Strengthening;Left;2 sets;15 reps;Weights    Long Arc Quad Weight  5 lbs.      Vasopneumatic   Number Minutes Vasopneumatic   15 minutes    Vasopnuematic Location   Knee    Vasopneumatic Pressure  Medium    Vasopneumatic Temperature   3 snowflakes  PT Short Term Goals - 08/26/17 3570      PT SHORT TERM GOAL #1   Title  be independent in initial HEP    Time  4    Period  Weeks    Status  Achieved      PT SHORT TERM GOAL #2   Title  demonstrate Rt knee A/ROM extension to lacking < or = to 10 degrees to normalize gait pattern    Baseline  10 degrees    Time  4    Period  Weeks    Status  Achieved      PT SHORT TERM GOAL #3   Title  demonstrate Rt knee A/ROM flexion to > or = to 95 degrees to allow for sitting and walking without substitution    Time  4    Period  Weeks    Status  Achieved      PT SHORT TERM GOAL #4   Title  reduce Rt knee pain to allow for walking > 10 minutes without limitation    Time  4    Period  Weeks    Status  Achieved        PT Long Term Goals - 08/30/17 0957      PT LONG TERM GOAL #1   Title  be independent in advanced HEP    Time  8    Period  Weeks    Status  Not Met Pt non compliant      PT LONG TERM GOAL #2   Title  reduce FOTO to < or = to 40% limitation    Baseline  50% limitation    Time  8    Period  Weeks    Status  Not Met      PT LONG TERM GOAL #3   Title  improve Rt knee A/ROM extension to lacking < or = to 5 degrees to normalize gait pattern    Time  8     Status  Achieved      PT LONG TERM GOAL #4   Title  improve Rt knee and hip strength to wean from walker to use of cane for all distances with safety    Baseline  no longer using walker.  Using quad cane for all distances    Time  8    Period  Weeks    Status  Achieved      PT LONG TERM GOAL #5   Title  demonstrate > or = to 120 degrees of Rt knee flexion to improve mobility, gait and squatting    Time  8    Period  Weeks    Status  Not Met 110 degrees      PT LONG TERM GOAL #6   Title  improve strength and reduce Rt knee pain to allow for walking 30 minutes without limitation to improve independence in the community    Baseline  15 minutes- haven't challenged this    Time  8    Period  Weeks    Status  Not Met            Plan - 08/30/17 0931    Clinical Impression Statement  Pt had a pop and soreness after riding the stationary bike last session. Pt was scared he "messed up his knee." He was reassured he did not mess the knee up and more than likely he stretched it farther that he was used to and possibly broke up some scar  tissue. Pt remains with difficulty negotiating stairs  in both directions. Pt not doing HEP.     Rehab Potential  Excellent    PT Frequency  3x / week    PT Duration  8 weeks    PT Treatment/Interventions  ADLs/Self Care Home Management;Cryotherapy;Electrical Stimulation;Moist Heat;Gait training;Stair training;Functional mobility training;Therapeutic activities;Therapeutic exercise;Balance training;Patient/family education;Neuromuscular re-education;Manual techniques;Passive range of motion;Taping;Vasopneumatic Device    PT Next Visit Plan  Discharge pt per pt request    PT Home Exercise Plan  access code:  [email or web address]     Consulted and Agree with Plan of Care  Patient       Patient will benefit from skilled therapeutic intervention in order to improve the following deficits and impairments:  Pain, Abnormal gait, Decreased mobility, Decreased activity  tolerance, Decreased endurance, Decreased range of motion, Decreased strength, Impaired flexibility, Increased edema, Difficulty walking  Visit Diagnosis: Acute pain of right knee  Localized edema  Muscle weakness (generalized)  Stiffness of right knee, not elsewhere classified  Other abnormalities of gait and mobility     Problem List There are no active problems to display for this patient.   Janisse Ghan, PTA 08/30/2017, 9:59 AM PHYSICAL THERAPY DISCHARGE SUMMARY  Visits from Start of Care: 15  Current functional level related to goals / functional outcomes: See above for current status.  Pt would like to D/C to HEP to address remaining strength and A/ROM deficits.     Remaining deficits: Pt with functional strength and A/ROM deficits of the Rt knee.  Pt has HEP in place to address remaining deficits.     Education / Equipment: HEP Plan: Patient agrees to discharge.  Patient goals were not met. Patient is being discharged due to being pleased with the current functional level.  ?????          Sigurd Sos, PT 09/03/17 7:53 AM  [facility] [address], [place], [place], [address] Phone: [phone]   Fax:  [phone]  Name: ADYAN PALAU MRN: [id number] Date of Birth: 1956/02/18

## 2017-09-11 ENCOUNTER — Telehealth (INDEPENDENT_AMBULATORY_CARE_PROVIDER_SITE_OTHER): Payer: Self-pay | Admitting: Orthopaedic Surgery

## 2017-09-11 NOTE — Telephone Encounter (Signed)
[facility]  [phone]   Dentistry wanted to know if pt would need an Antibiotic due to recent surgery

## 2017-09-12 NOTE — Telephone Encounter (Signed)
I called and advised, no pre med needed per Dr. Lorin Mercy. Script faxed over to dental office to put on file.

## 2017-11-01 ENCOUNTER — Encounter (INDEPENDENT_AMBULATORY_CARE_PROVIDER_SITE_OTHER): Payer: Self-pay | Admitting: Orthopaedic Surgery

## 2017-11-01 ENCOUNTER — Ambulatory Visit (INDEPENDENT_AMBULATORY_CARE_PROVIDER_SITE_OTHER): Payer: BLUE CROSS/BLUE SHIELD | Admitting: Orthopaedic Surgery

## 2017-11-01 VITALS — BP 147/97 | HR 79 | Ht 65.0 in | Wt 215.0 lb

## 2017-11-01 DIAGNOSIS — Z96651 Presence of right artificial knee joint: Secondary | ICD-10-CM

## 2017-11-01 NOTE — Progress Notes (Signed)
61 year old white male who is status post right total knee replacement July 12, 2017 returns.  States that he is doing well and continues to improve.  He is pleased with his surgical result up to this point.  Still has some stiffness and tightness in the knee but is continue to do home excise program.  Exam Patient ablating extremely well.  Knee range of motion about 0-115+ degrees.  No much by the way swelling.  No significant palpable effusion.  No signs of infection.  Calf nontender.  Neurovascular intact.   Plan Patient will follow with me in 3 months when he is about 6 months out from surgery.  We will get right knee x-ray at that point.  Return sooner if needed.

## 2018-01-29 ENCOUNTER — Encounter (INDEPENDENT_AMBULATORY_CARE_PROVIDER_SITE_OTHER): Payer: Self-pay | Admitting: Surgery

## 2018-01-29 ENCOUNTER — Ambulatory Visit (INDEPENDENT_AMBULATORY_CARE_PROVIDER_SITE_OTHER): Payer: BLUE CROSS/BLUE SHIELD | Admitting: Surgery

## 2018-01-29 ENCOUNTER — Ambulatory Visit (INDEPENDENT_AMBULATORY_CARE_PROVIDER_SITE_OTHER): Payer: Self-pay

## 2018-01-29 DIAGNOSIS — M25561 Pain in right knee: Secondary | ICD-10-CM

## 2018-01-29 DIAGNOSIS — Z96651 Presence of right artificial knee joint: Secondary | ICD-10-CM

## 2018-01-29 DIAGNOSIS — G8929 Other chronic pain: Secondary | ICD-10-CM | POA: Diagnosis not present

## 2018-01-29 NOTE — Progress Notes (Signed)
62 year old white male with 53-month status post right total knee replacement returns.  States that he is doing well.  He is pleased with his surgical result of this point.  Still continues to have some swelling and stiffness but this is improving.  Admits to some soreness around the lateral joint line.   Exam Very pleasant white male alert oriented in no acute distress.  Ambulating well.  Right knee range of motion about 0 to 110 degrees.  He is somewhat tender along the lateral joint line.  Some swelling without large effusion.  No signs of infection.  Calf is nontender.  Neurovascular intact.   X-rays Right knee show good alignment and seating of his prosthesis.  On the lateral joint he does have some spurring or formation of heterotopic bone.   Plan I encouraged patient to continue his home excise program.  Follow-up with Dr. Lorin Mercy in 3 months for recheck and possible repeat of his x-rays if he still continues to have some soreness along the lateral joint line.  Not really describing any mechanical symptoms at this point.  He will let us know if pain worsens before scheduled appointment.

## 2018-05-02 ENCOUNTER — Ambulatory Visit (INDEPENDENT_AMBULATORY_CARE_PROVIDER_SITE_OTHER): Payer: BLUE CROSS/BLUE SHIELD | Admitting: Orthopaedic Surgery

## 2018-05-02 ENCOUNTER — Encounter (INDEPENDENT_AMBULATORY_CARE_PROVIDER_SITE_OTHER): Payer: Self-pay | Admitting: Orthopaedic Surgery

## 2018-05-02 VITALS — BP 139/88 | HR 61 | Ht 65.0 in | Wt 215.0 lb

## 2018-05-02 DIAGNOSIS — Z96651 Presence of right artificial knee joint: Secondary | ICD-10-CM | POA: Diagnosis not present

## 2018-05-02 NOTE — Progress Notes (Signed)
Office Visit Note   Patient: [name]           Date of Birth: 10-29-1955           MRN: [id number] Visit Date: 05/02/2018              Requested by: Chesley Noon, MD [place], [place] [address] PCP: Chesley Noon, MD   Assessment & Plan: Visit Diagnoses:  1. History of total right knee replacement     Plan: Patient will continue to work on quad strengthening he is gotten improvement less symptoms than last visit but still has a residual deficit which she can fix with additional exercises we gave him he is happy with the surgical result.  I gave him several exercises to work on.  Return PRN.  Follow-Up Instructions: No follow-ups on file.   Orders:  No orders of the defined types were placed in this encounter.  No orders of the defined types were placed in this encounter.     Procedures: No procedures performed   Clinical Data: No additional findings.   Subjective: Chief Complaint  Patient presents with  . Right Knee - Follow-up    07/12/17 Right TKA    HPI 63 year old male returns post total knee arthroplasty 07/12/2017 with residual weakness that he is worked on since his last visit.  He is used some diclofenac off and on when he goes down steps he has to hang onto the rail but he can go up steps with alternating reciprocal gait.  He could not do that 2 months ago. Review of Systems 14 point systems unchanged from surgery no chills or fever no associated back pain.   Objective: Vital Signs: BP 139/88   Pulse 61   Ht 5\' 5"  (1.651 m)   Wt 215 lb (97.5 kg)   BMI 35.78 kg/m   Physical Exam Constitutional:      Appearance: He is well-developed.  HENT:     Head: Normocephalic and atraumatic.  Eyes:     Pupils: Pupils are equal, round, and reactive to light.  Neck:     Thyroid: No thyromegaly.     Trachea: No tracheal deviation.  Cardiovascular:     Rate and Rhythm: Normal rate.  Pulmonary:     Effort: Pulmonary effort is  normal.     Breath sounds: No wheezing.  Abdominal:     General: Bowel sounds are normal.     Palpations: Abdomen is soft.  Skin:    General: Skin is warm and dry.     Capillary Refill: Capillary refill takes less than 2 seconds.  Neurological:     Mental Status: He is alert and oriented to person, place, and time.  Psychiatric:        Behavior: Behavior normal.        Thought Content: Thought content normal.        Judgment: Judgment normal.     Ortho Exam previous patella fracture left with mild crepitus.  Well-healed incision right total knee arthroplasty.  He still hops when he goes up right foot first on a step.  He can get from sitting to standing without using his arm.  Quad takes good hand resistance.  Quad weakness is noted with single stance knee flexion and extension.  Specialty Comments:  No specialty comments available.  Imaging: No results found.   PMFS History: There are no active problems to display for this patient.  Past Medical History:  Diagnosis Date  . Acid reflux   . Arthritis   . Depression   . Glaucoma   . Hypertension     Family History  Problem Relation Age of Onset  . Bladder Cancer Mother   . Coronary artery disease Mother   . Heart disease Mother   . Stroke Father   . Alcoholism Father     Past Surgical History:  Procedure Laterality Date  . ANKLE FRACTURE SURGERY     states broken leg not ankle  . COLONOSCOPY    . KNEE SURGERY    . TOTAL KNEE ARTHROPLASTY Right 07/12/2017   Procedure: RIGHT TOTAL KNEE ARTHROPLASTY -CEMENTED;  Surgeon: Marybelle Killings, MD;  Location: [place];  Service: Orthopedics;  Laterality: Right;   Social History   Occupational History  . Not on file  Tobacco Use  . Smoking status: Former Smoker    Last attempt to quit: 12/19/1999    Years since quitting: 18.3  . Smokeless tobacco: Never Used  Substance and Sexual Activity  . Alcohol use: Yes    Frequency: Never    Comment: 7-14 day  . Drug use: No  .  Sexual activity: Not on file

## 2018-09-17 DIAGNOSIS — E66811 Obesity, class 1: Secondary | ICD-10-CM | POA: Insufficient documentation

## 2018-09-17 DIAGNOSIS — M1990 Unspecified osteoarthritis, unspecified site: Secondary | ICD-10-CM | POA: Insufficient documentation

## 2018-11-08 HISTORY — PX: COLONOSCOPY: SHX174

## 2018-11-24 DIAGNOSIS — Z860101 Personal history of adenomatous and serrated colon polyps: Secondary | ICD-10-CM | POA: Insufficient documentation

## 2019-04-07 ENCOUNTER — Telehealth: Payer: Self-pay | Admitting: Orthopaedic Surgery

## 2019-04-07 DIAGNOSIS — Z96651 Presence of right artificial knee joint: Secondary | ICD-10-CM

## 2019-04-07 NOTE — Telephone Encounter (Signed)
Yes oK for PT thanks quad strengthening.

## 2019-04-07 NOTE — Telephone Encounter (Signed)
Please advise 

## 2019-04-07 NOTE — Telephone Encounter (Signed)
Patient called stating that he had a knee replacement a year ago and is now having trouble with getting back up once he kneels down.  He is wanting to know if Dr. Lorin Mercy would write an order for PT.  CB#[phone].  Thank you.

## 2019-04-09 NOTE — Addendum Note (Signed)
Addended by: Meyer Cory on: 04/09/2019 04:47 PM   Modules accepted: Orders

## 2019-04-09 NOTE — Telephone Encounter (Signed)
Order entered. I left voicemail for patient advising. 

## 2019-04-15 ENCOUNTER — Other Ambulatory Visit: Payer: Self-pay

## 2019-04-15 ENCOUNTER — Encounter: Payer: Self-pay | Admitting: Physical Therapy

## 2019-04-15 ENCOUNTER — Ambulatory Visit: Payer: Medicare HMO | Attending: Orthopaedic Surgery | Admitting: Physical Therapy

## 2019-04-15 DIAGNOSIS — G8929 Other chronic pain: Secondary | ICD-10-CM | POA: Diagnosis present

## 2019-04-15 DIAGNOSIS — R2689 Other abnormalities of gait and mobility: Secondary | ICD-10-CM | POA: Diagnosis present

## 2019-04-15 DIAGNOSIS — M25561 Pain in right knee: Secondary | ICD-10-CM | POA: Insufficient documentation

## 2019-04-15 DIAGNOSIS — R600 Localized edema: Secondary | ICD-10-CM | POA: Diagnosis present

## 2019-04-15 DIAGNOSIS — M25661 Stiffness of right knee, not elsewhere classified: Secondary | ICD-10-CM | POA: Insufficient documentation

## 2019-04-15 DIAGNOSIS — M6281 Muscle weakness (generalized): Secondary | ICD-10-CM | POA: Insufficient documentation

## 2019-04-15 NOTE — Patient Instructions (Signed)
Access Code: [email or web address]  URL: [email or web address]  Date: 04/15/2019  Prepared by: Venetia Night Dante Cooter   Exercises  Supine Heel Slides - 10 reps - 2 sets - 1x daily - 7x weekly  Squat with Chair Touch - 10 reps - 3 sets - 1x daily - 7x weekly  Seated Long Arc Quad - 10 reps - 1 sets - 3x daily - 7x weekly  Supine Hamstring Stretch - 3 reps - 1 sets - 30 hold - 1x daily - 7x weekly  Seated Hamstring Stretch - 3 reps - 1 sets - 30 hold - 1x daily - 7x weekly  Hooklying Single Knee to Chest - 3 reps - 3 sets - 10 hold - 1x daily - 7x weekly

## 2019-04-15 NOTE — Therapy (Signed)
[facility] [address] [place], [place], [address] Phone: [phone]   Fax:  [phone]  Physical Therapy Evaluation  Patient Details  Name: [name] MRN: [id number] Date of Birth: May 31, 1955 Referring Provider (PT): Marybelle Killings, MD   Encounter Date: 04/15/2019  PT End of Session - 04/15/19 1249    Visit Number  1    Date for PT Re-Evaluation  05/27/19    Authorization Type  [employer]    PT Start Time  1100    PT Stop Time  1140    PT Time Calculation (min)  40 min    Activity Tolerance  Patient tolerated treatment well    Behavior During Therapy  [facility] for tasks assessed/performed       Past Medical History:  Diagnosis Date  . Acid reflux   . Arthritis   . Depression   . Glaucoma   . Hypertension     Past Surgical History:  Procedure Laterality Date  . ANKLE FRACTURE SURGERY     states broken leg not ankle  . COLONOSCOPY    . KNEE SURGERY    . TOTAL KNEE ARTHROPLASTY Right 07/12/2017   Procedure: RIGHT TOTAL KNEE ARTHROPLASTY -CEMENTED;  Surgeon: Marybelle Killings, MD;  Location: [place];  Service: Orthopedics;  Laterality: Right;    There were no vitals filed for this visit.   Subjective Assessment - 04/15/19 1101    Subjective  History of Rt TKR April 2019, had previous PT at this facility for the knee last year.  Has become concerned Rt knee might be weakening and is having trouble getting up from stooped or squat position.  Walks on trails and worries about falling and not being able to get up.    Pertinent History  Rt TKR 07/2017    Limitations  Other (comment)   transfers, stairs   How long can you walk comfortably?  1 mile    Patient Stated Goals  Rt knee strength, be able to get up from squat/bend/floor for confidence to walk on trails    Currently in Pain?  Yes    Pain Score  2     Pain Location  Knee    Pain Orientation  Right;Lateral    Pain Descriptors / Indicators  Throbbing     Pain Type  Chronic pain    Pain Onset  More than a month ago    Pain Frequency  Intermittent    Aggravating Factors   getting up from squatting    Pain Relieving Factors  ice on occassion    Effect of Pain on Daily Activities  household tasks, yard work         [facility] PT Assessment - 04/15/19 0001      Assessment   Medical Diagnosis  Z96.651 (ICD-10-CM) - History of total right knee replacement    Referring Provider (PT)  Marybelle Killings, MD    Onset Date/Surgical Date  --   Rt TKR April 2019   Next MD Visit  as needed, called for PT Rx    Prior Therapy  yes here at [facility] for knee      Precautions   Precautions  None      Restrictions   Weight Bearing Restrictions  No      Balance Screen   Has the patient fallen in the past 6 months  Yes    How many times?  1    Has the  patient had a decrease in activity level because of a fear of falling?   Yes   avoids walking on trails for fear of falling   Is the patient reluctant to leave their home because of a fear of falling?   No      Home Film/video editor residence    Living Arrangements  Spouse/significant other    Home Access  Level entry    Home Layout  One level      Prior Function   Level of Independence  Independent    Vocation  Retired    Leisure  garden, yard work      Cognition   Overall Cognitive Status  [facility] [employer] for tasks assessed      Observation/Other Assessments   Focus on Therapeutic Outcomes (FOTO)   33%   goal 30%     Functional Tests   Functional tests  Squat;Sit to Stand;Single leg stance;Step up;Step down;Lunges;Floor to Stand      Squat   Comments  able to squat and return from floor      Lunges   Comments  unable to perform due to stiffness, weakness, slight pain      Step Up   Comments  ind      Step Down   Comments  poor eccentric control bil Rt>Lt      Single Leg Stance   Comments  varied balance ranging from 1-10 sec wiht repeated trials       Sit to Stand   Comments  ind without UE assist      Floor to Stand   Comments  unable to perform      ROM / Strength   AROM / PROM / Strength  AROM;Strength      AROM   AROM Assessment Site  Knee    Right/Left Knee  Right;Left    Right Knee Extension  0    Right Knee Flexion  105    Left Knee Extension  0    Left Knee Flexion  115      Strength   Overall Strength Comments  4/5 throughout Rt LE, demos difficulty with functional control around Rt knee for lunge, deeper squat, step downs      Palpation   Patella mobility  WNL bil                Objective measurements completed on examination: See above findings.              PT Education - 04/15/19 1142    Education Details  Access Code: [email or web address]    Person(s) Educated  Patient    Methods  Explanation;Demonstration;Verbal cues;Handout    Comprehension  Verbalized understanding;Returned demonstration       PT Short Term Goals - 04/15/19 1259      PT SHORT TERM GOAL #1   Title  Pt will be independent in initial HEP    Time  3    Period  Weeks    Status  New    Target Date  05/06/19        PT Long Term Goals - 04/15/19 1300      PT LONG TERM GOAL #1   Title  Pt will be ind in advanced HEP    Time  6    Period  Weeks    Status  New    Target Date  05/27/19      PT LONG TERM GOAL #2  Title  reduce FOTO to </= 30% to demo less limitation    Baseline  33%    Time  6    Period  Weeks    Status  New    Target Date  05/27/19      PT LONG TERM GOAL #3   Title  Pt will achieve at least 115 deg Rt knee flexion to improve ability to squat and rise from floor    Time  6    Period  Weeks    Status  New    Target Date  05/27/19      PT LONG TERM GOAL #4   Title  Pt will be able to demo strategy for floor to stand transfer in the event of a fall.    Time  6    Period  Weeks    Status  New    Target Date  05/27/19      PT LONG TERM GOAL #5   Title  Pt will demo flexibility throughout bil  LEs to [facility] to enable greater range for squatting/stooping for gardening and yard work    Time  6    Period  Weeks    Status  New    Target Date  05/27/19             Plan - 04/15/19 1251    Clinical Impression Statement  Pt is a return patient with report of ongoing weakness around Rt knee that limits him from getting back up from stooping/squatting.  He would like to get back to walking on trails but has a fear of falling and not being able to get back up.  He lacks 10 deg of Rt knee flexion (105) compared to Lt (115).  He demos good strength in open chain testing around knee (4/5), but demos weakness in functional closed chain patterns for deeper squat, lunge, step ups < step downs.  He has signif flexibility restrictions throughout bil LEs.  Hip ROM is limited bil in all planes.  His main goal is to learn how and be strong enough to get up/down from floor.  He has fair balance in SLS bil, needing multiple attemps to balance x 10 sec.  PT reinstated some of his HEP from last round of PT today.  He will benefit from skilled PT to address deficits and build strength and confidence for a more active and safe activity level.    Personal Factors and Comorbidities  Fitness    Examination-Activity Limitations  Transfers;Squat;Stairs;Bend    [employer] Activity;Yard Work    Stability/Clinical Decision Making  Stable/Uncomplicated    Designer, jewellery  Low    Rehab Potential  Excellent    PT Frequency  2x / week    PT Duration  6 weeks    PT Treatment/Interventions  ADLs/Self Care Home Management;Cryotherapy;Electrical Stimulation;Moist Heat;Gait training;Stair training;Functional mobility training;Therapeutic activities;Therapeutic exercise;Balance training;Neuromuscular re-education;Manual techniques;Patient/family education;Passive range of motion;Dry needling;Taping;Spinal Manipulations;Joint Manipulations    PT Next Visit Plan  f/u on HEP, continue  LE flexibility (progress stretches for HEP), quad strength, ongoing focus toward task specific strength for rising from stooped postition, floor to stand transfer over time    PT Home Exercise Plan  Access Code: [email or web address]       Patient will benefit from skilled therapeutic intervention in order to improve the following deficits and impairments:  Decreased range of motion, Decreased endurance, Pain, Decreased balance, Hypomobility, Impaired flexibility, Improper body mechanics, Decreased strength, Decreased  mobility  Visit Diagnosis: Stiffness of right knee, not elsewhere classified - Plan: PT plan of care cert/re-cert  Muscle weakness (generalized) - Plan: PT plan of care cert/re-cert  Chronic pain of right knee - Plan: PT plan of care cert/re-cert     Problem List There are no problems to display for this patient.   Venetia Night Marketta Valadez, PT 04/15/19 1:05 PM   [facility] [address], [place], [place], [address] Phone: [phone]   Fax:  [phone]  Name: ABHIMANYU STRZELCZYK MRN: [id number] Date of Birth: 06-Feb-1956

## 2019-04-22 ENCOUNTER — Encounter: Payer: Self-pay | Admitting: Physical Therapy

## 2019-04-22 ENCOUNTER — Other Ambulatory Visit: Payer: Self-pay

## 2019-04-22 ENCOUNTER — Ambulatory Visit: Payer: Medicare HMO | Admitting: Physical Therapy

## 2019-04-22 DIAGNOSIS — M6281 Muscle weakness (generalized): Secondary | ICD-10-CM

## 2019-04-22 DIAGNOSIS — R2689 Other abnormalities of gait and mobility: Secondary | ICD-10-CM

## 2019-04-22 DIAGNOSIS — M25661 Stiffness of right knee, not elsewhere classified: Secondary | ICD-10-CM | POA: Diagnosis not present

## 2019-04-22 DIAGNOSIS — G8929 Other chronic pain: Secondary | ICD-10-CM

## 2019-04-22 DIAGNOSIS — M25561 Pain in right knee: Secondary | ICD-10-CM

## 2019-04-22 DIAGNOSIS — R600 Localized edema: Secondary | ICD-10-CM

## 2019-04-22 NOTE — Therapy (Signed)
[facility] [address] [place], [place], [address] Phone: [phone]   Fax:  [phone]  Physical Therapy Treatment  Patient Details  Name: [name] MRN: [id number] Date of Birth: 11-Mar-1956 Referring Provider (PT): Marybelle Killings, MD   Encounter Date: 04/22/2019  PT End of Session - 04/22/19 1019    Visit Number  2    Date for PT Re-Evaluation  05/27/19    Authorization Type  [employer]    PT Start Time  1017    PT Stop Time  1105    PT Time Calculation (min)  48 min    Activity Tolerance  Patient tolerated treatment well    Behavior During Therapy  [facility] for tasks assessed/performed       Past Medical History:  Diagnosis Date  . Acid reflux   . Arthritis   . Depression   . Glaucoma   . Hypertension     Past Surgical History:  Procedure Laterality Date  . ANKLE FRACTURE SURGERY     states broken leg not ankle  . COLONOSCOPY    . KNEE SURGERY    . 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  Subjective Assessment - 04/22/19 1017    Subjective  i have a little ache in my knee today.    Pertinent History  Rt TKR 07/2017    Limitations  Other (comment)    Currently in Pain?  Yes    Pain Score  2     Pain Location  Knee    Pain Orientation  Right    Pain Descriptors / Indicators  Dull;Aching    Aggravating Factors   Getting up from squatting    Pain Relieving Factors  ice    Multiple Pain Sites  No                       [facility] Adult PT Treatment/Exercise - 04/22/19 0001      Therapeutic Activites    Therapeutic Activities  ADL's;Lifting    ADL's  Half kneeling on soft pad: stand up  using LTUE and RTLE. 10x    Lifting  10# box lift from 8 " box height : lift to waist 10x2       Knee/Hip Exercises: Aerobic   Nustep  L2 x 6 min      Knee/Hip Exercises: Standing   Forward Step Up  Right;2 sets;10 reps;Hand Hold: 0;Step Height: 6"      Knee/Hip Exercises: Seated   Long Arc Quad  Strengthening;Right;2 sets;10 reps;Weights    Long Arc Quad Weight  3 lbs.    Sit to Sand  1 set;2 sets;10 reps;without UE support      Cryotherapy   Number Minutes Cryotherapy  10 Minutes    Cryotherapy Location  Knee    Type of Cryotherapy  Ice pack   post session for post exercise soreness              PT Short Term Goals - 04/15/19 1259      PT SHORT TERM GOAL #1   Title  Pt will be independent in initial HEP    Time  3    Period  Weeks    Status  New  Target Date  05/06/19        PT Long Term Goals - 04/15/19 1300      PT LONG TERM GOAL #1   Title  Pt will be ind in advanced HEP    Time  6    Period  Weeks    Status  New    Target Date  05/27/19      PT LONG TERM GOAL #2   Title  reduce FOTO to </= 30% to demo less limitation    Baseline  33%    Time  6    Period  Weeks    Status  New    Target Date  05/27/19      PT LONG TERM GOAL #3   Title  Pt will achieve at least 115 deg Rt knee flexion to improve ability to squat and rise from floor    Time  6    Period  Weeks    Status  New    Target Date  05/27/19      PT LONG TERM GOAL #4   Title  Pt will be able to demo strategy for floor to stand transfer in the event of a fall.    Time  6    Period  Weeks    Status  New    Target Date  05/27/19      PT LONG TERM GOAL #5   Title  Pt will demo flexibility throughout bil LEs to [facility] to enable greater range for squatting/stooping for gardening and yard work    Time  6    Period  Weeks    Status  New    Target Date  05/27/19            Plan - 04/22/19 1020    Clinical Impression Statement  Pt semi-compliant with HEP. He was able to complete all his exercises/tasks using his quads well. Today he could squat to pick the 10# box up off the 8"  box to gradually progress the depth of squat. Pt struggles more with kneeling on the floor and standing up: requires LTUE to assit in standing up. Knee was a litle sore at end of session, ice helped.    Personal Factors and Comorbidities  Fitness    Examination-Activity Limitations  Transfers;Squat;Stairs;Bend    [employer] Activity;Yard Work    Stability/Clinical Decision Making  Stable/Uncomplicated    Rehab Potential  Excellent    PT Frequency  2x / week    PT Duration  6 weeks    PT Treatment/Interventions  ADLs/Self Care Home Management;Cryotherapy;Electrical Stimulation;Moist Heat;Gait training;Stair training;Functional mobility training;Therapeutic activities;Therapeutic exercise;Balance training;Neuromuscular re-education;Manual techniques;Patient/family education;Passive range of motion;Dry needling;Taping;Spinal Manipulations;Joint Manipulations    PT Next Visit Plan  4# LAQ, Nustep, box squat off 6" box, half kneeling to stand with or without UE, may need ice at end of session for soreness.    PT Home Exercise Plan  Access Code: [email or web address]    Consulted and Agree with Plan of Care  Patient       Patient will benefit from skilled therapeutic intervention in order to improve the following deficits and impairments:  Decreased range of motion, Decreased endurance, Pain, Decreased balance, Hypomobility, Impaired flexibility, Improper body mechanics, Decreased strength, Decreased mobility  Visit Diagnosis: Muscle weakness (generalized)  Stiffness of right knee, not elsewhere classified  Chronic pain of right knee  Acute pain of right knee  Localized edema  Other abnormalities of gait and mobility  Problem List There are no problems to display for this patient.   COCHRAN,JENNIFER, PTA 04/22/2019, 10:56 AM  [facility] [address], [place], [place], [address] Phone:  [phone]   Fax:  [phone]  Name: [name] MRN: [id number] Date of Birth: 1955-10-23

## 2019-04-27 ENCOUNTER — Other Ambulatory Visit: Payer: Self-pay

## 2019-04-27 ENCOUNTER — Encounter: Payer: Self-pay | Admitting: Physical Therapy

## 2019-04-27 ENCOUNTER — Ambulatory Visit: Payer: Medicare HMO | Admitting: Physical Therapy

## 2019-04-27 DIAGNOSIS — M25661 Stiffness of right knee, not elsewhere classified: Secondary | ICD-10-CM | POA: Diagnosis not present

## 2019-04-27 DIAGNOSIS — G8929 Other chronic pain: Secondary | ICD-10-CM

## 2019-04-27 DIAGNOSIS — M6281 Muscle weakness (generalized): Secondary | ICD-10-CM

## 2019-04-27 DIAGNOSIS — M25561 Pain in right knee: Secondary | ICD-10-CM

## 2019-04-27 DIAGNOSIS — R2689 Other abnormalities of gait and mobility: Secondary | ICD-10-CM

## 2019-04-27 DIAGNOSIS — R600 Localized edema: Secondary | ICD-10-CM

## 2019-04-27 NOTE — Therapy (Signed)
[facility] [address], [place], [place], [address] Phone: [phone]   Fax:  [phone]  Physical Therapy Treatment  Patient Details  Name: [name] MRN: [id number] Date of Birth: January 13, 1956 Referring Provider (PT): Marybelle Killings, MD   Encounter Date: 04/27/2019  PT End of Session - 04/27/19 0932    Visit Number  3    Date for PT Re-Evaluation  05/27/19    Authorization Type  [employer]    PT Start Time  0932    PT Stop Time  [id number]    PT Time Calculation (min)  43 min    Activity Tolerance  Patient tolerated treatment well    Behavior During Therapy  [facility] for tasks assessed/performed       Past Medical History:  Diagnosis Date  . Acid reflux   . Arthritis   . Depression   . Glaucoma   . Hypertension     Past Surgical History:  Procedure Laterality Date  . ANKLE FRACTURE SURGERY     states broken leg not ankle  . COLONOSCOPY    . KNEE SURGERY    . 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  Subjective Assessment - 04/27/19 0933    Subjective  I did ok after last time, my knee was a little sore.    Pertinent History  Rt TKR 07/2017    Currently in Pain?  Yes    Pain Score  1     Pain Location  Knee    Pain Orientation  Right    Pain Descriptors / Indicators  Aching;Dull    Aggravating Factors   Getting up from squatting    Pain Relieving Factors  ice    Multiple Pain Sites  No         [facility] PT Assessment - 04/27/19 0001      AROM   Right Knee Flexion  107                   [facility] Adult PT Treatment/Exercise - 04/27/19 0001      Therapeutic Activites    Lifting  10# box lift from 6"" box height : lift to waist 10x2       Knee/Hip Exercises: Stretches   Knee: Self-Stretch to  increase Flexion  --   20x with floor rider for warm up   Gastroc Stretch  Left;3 reps;10 seconds    Soleus Stretch  Both;3 reps;10 seconds      Knee/Hip Exercises: Aerobic   Nustep  L2 x 8 min   PTA present     Knee/Hip Exercises: Seated   Long Arc Quad  Strengthening;Right;2 sets;10 reps;Weights   Added ball squeeze   Long Arc Quad Weight  4 lbs.    Sit to Sand  1 set;2 sets;15 reps;without UE support      Manual Therapy   Manual Therapy  Soft tissue mobilization    Soft tissue mobilization  Lateral RT quad and hamstring, applie dbiofreeze after: pt will ice later               PT Short Term Goals - 04/27/19 0958      PT SHORT TERM GOAL #1   Title  Pt will be independent in initial HEP  Time  3    Period  Weeks    Status  Achieved    Target Date  05/06/19        PT Long Term Goals - 04/15/19 1300      PT LONG TERM GOAL #1   Title  Pt will be ind in advanced HEP    Time  6    Period  Weeks    Status  New    Target Date  05/27/19      PT LONG TERM GOAL #2   Title  reduce FOTO to </= 30% to demo less limitation    Baseline  33%    Time  6    Period  Weeks    Status  New    Target Date  05/27/19      PT LONG TERM GOAL #3   Title  Pt will achieve at least 115 deg Rt knee flexion to improve ability to squat and rise from floor    Time  6    Period  Weeks    Status  New    Target Date  05/27/19      PT LONG TERM GOAL #4   Title  Pt will be able to demo strategy for floor to stand transfer in the event of a fall.    Time  6    Period  Weeks    Status  New    Target Date  05/27/19      PT LONG TERM GOAL #5   Title  Pt will demo flexibility throughout bil LEs to [facility] to enable greater range for squatting/stooping for gardening and yard work    Time  6    Period  Weeks    Status  New    Target Date  05/27/19            Plan - 04/27/19 0932    Clinical Impression Statement  Pt presents today with minimal knee pain. Pt was able to squat  down to pick up the 10# box off a 6" box today vs teh 8" box last session. Pt demonstrates tight gastroc and solues complex when he was squatting. These stretches were given for HEP progression today.    Personal Factors and Comorbidities  Fitness    Examination-Activity Limitations  Transfers;Squat;Stairs;Bend    [employer] Activity;Yard Work    Stability/Clinical Decision Making  Stable/Uncomplicated    Rehab Potential  Excellent    PT Frequency  2x / week    PT Duration  6 weeks    PT Treatment/Interventions  ADLs/Self Care Home Management;Cryotherapy;Electrical Stimulation;Moist Heat;Gait training;Stair training;Functional mobility training;Therapeutic activities;Therapeutic exercise;Balance training;Neuromuscular re-education;Manual techniques;Patient/family education;Passive range of motion;Dry needling;Taping;Spinal Manipulations;Joint Manipulations    PT Next Visit Plan  10# box from floor to waist next, increase  resistance for quad strength.    PT Home Exercise Plan  Access Code: [email or web address]    Consulted and Agree with Plan of Care  Patient       Patient will benefit from skilled therapeutic intervention in order to improve the following deficits and impairments:  Decreased range of motion, Decreased endurance, Pain, Decreased balance, Hypomobility, Impaired flexibility, Improper body mechanics, Decreased strength, Decreased mobility  Visit Diagnosis: Muscle weakness (generalized)  Stiffness of right knee, not elsewhere classified  Chronic pain of right knee  Acute pain of right knee  Localized edema  Other abnormalities of gait and mobility     Problem List There are no problems to  display for this patient.   Laiba Fuerte, PTA 04/27/2019, 10:32 AM  [facility] [address], [place], [place], [address] Phone: [phone]   Fax:  [phone]  Name: [name] MRN: [id number] Date of Birth: Sep 19, 1955  Access Code: [email or web address]  URL: [email or web address]  Date: 04/27/2019  Prepared by: Myrene Galas   Exercises  Supine Heel Slides - 10 reps - 2 sets - 1x daily - 7x weekly  Squat with Chair Touch - 10 reps - 3 sets - 1x daily - 7x weekly  Seated Long Arc Quad - 10 reps - 1 sets - 3x daily - 7x weekly  Supine Hamstring Stretch - 3 reps - 1 sets - 30 hold - 1x daily - 7x weekly  Seated Hamstring Stretch - 3 reps - 1 sets - 30 hold - 1x daily - 7x weekly  Hooklying Single Knee to Chest - 3 reps - 3 sets - 10 hold - 1x daily - 7x weekly  Gastroc Stretch on Wall - 3 reps - 1 sets - 20 hold - 3x daily - 7x weekly  Soleus Stretch on Wall - 3 reps - 1 sets - 20 hold - 3x daily - 7x weekly

## 2019-04-29 ENCOUNTER — Ambulatory Visit: Payer: Medicare HMO | Admitting: Physical Therapy

## 2019-04-29 ENCOUNTER — Encounter: Payer: Self-pay | Admitting: Physical Therapy

## 2019-04-29 ENCOUNTER — Other Ambulatory Visit: Payer: Self-pay

## 2019-04-29 DIAGNOSIS — M25661 Stiffness of right knee, not elsewhere classified: Secondary | ICD-10-CM

## 2019-04-29 DIAGNOSIS — R2689 Other abnormalities of gait and mobility: Secondary | ICD-10-CM

## 2019-04-29 DIAGNOSIS — G8929 Other chronic pain: Secondary | ICD-10-CM

## 2019-04-29 DIAGNOSIS — M6281 Muscle weakness (generalized): Secondary | ICD-10-CM

## 2019-04-29 DIAGNOSIS — R600 Localized edema: Secondary | ICD-10-CM

## 2019-04-29 DIAGNOSIS — M25561 Pain in right knee: Secondary | ICD-10-CM

## 2019-04-29 NOTE — Therapy (Signed)
[facility] [address] [place], [place], [address] Phone: [phone]   Fax:  [phone]  Physical Therapy Treatment  Patient Details  Name: [name] MRN: [id number] Date of Birth: 01-Oct-1955 Referring Provider (PT): Marybelle Killings, MD   Encounter Date: 04/29/2019  PT End of Session - 04/29/19 0937    Visit Number  4    Date for PT Re-Evaluation  05/27/19    Authorization Type  [employer]    Authorization Time Period  04/22/19-05/27/19    Authorization - Visit Number  4    Authorization - Number of Visits  12    PT Start Time  0932    PT Stop Time  1013    PT Time Calculation (min)  41 min    Activity Tolerance  Patient tolerated treatment well    Behavior During Therapy  [facility] for tasks assessed/performed       Past Medical History:  Diagnosis Date  . Acid reflux   . Arthritis   . Depression   . Glaucoma   . Hypertension     Past Surgical History:  Procedure Laterality Date  . ANKLE FRACTURE SURGERY     states broken leg not ankle  . COLONOSCOPY    . KNEE SURGERY    . 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  Subjective Assessment - 04/29/19 0935    Subjective  Doing well. Knee is loosening up, pain is low.  Doing HEP daily.    Pertinent History  Rt TKR 07/2017    How long can you walk comfortably?  1 mile    Patient Stated Goals  Rt knee strength, be able to get up from squat/bend/floor for confidence to walk on trails    Currently in Pain?  Yes    Pain Score  1     Pain Location  Knee    Pain Orientation  Right    Pain Descriptors / Indicators  Aching;Dull    Pain Type  Chronic pain    Pain Onset  More than a month ago    Aggravating Factors   getting up from squatting    Pain Relieving Factors   ice    Effect of Pain on Daily Activities  household tasks, yard work                       [employer] Adult PT Treatment/Exercise - 04/29/19 0001      Therapeutic Activites    Lifting  10# box lift tall handles from floor, PT cued squat with knees not bend from back with straight legs      Exercises   Exercises  Knee/Hip      Knee/Hip Exercises: Stretches   Gastroc Stretch  Both;2 reps;20 seconds    Soleus Stretch  Both;2 reps;20 seconds    Soleus Stretch Limitations  reviewed form via PT demo      Knee/Hip Exercises: Aerobic   Nustep  L3 x 8', PT present to review goals and progress      Knee/Hip Exercises: Standing   Other Standing Knee Exercises  backward lunge x 5 each leg holding back of chair to break down task of getting up from floor    Other Standing Knee Exercises  kneel down to double black pad standing on Lt LE lowering Rt knee holding edge of mat table x 10 reps      Knee/Hip Exercises: Seated   Long Arc Quad  Strengthening;Both;2 sets;10 reps;Weights    Long Arc Quad Weight  4 lbs.    Long [employer] Limitations  with ball squeeze    Heel Slides  AAROM;20 reps    Heel Slides Limitations  Rt with floor slider    Sit to Sand  2 sets;15 reps;without UE support   holding 5# weight plate at chest     Knee/Hip Exercises: Supine   Straight Leg Raises  Strengthening;Both;2 sets;5 reps;10 reps    Straight Leg Raises Limitations  2# ankle weight, PT cued exhale on effort               PT Short Term Goals - 04/29/19 0939      PT SHORT TERM GOAL #1   Title  Pt will be independent in initial HEP    Status  Achieved      PT SHORT TERM GOAL #2   Title  demonstrate Rt knee A/ROM extension to lacking < or = to 10 degrees to normalize gait pattern    Status  Achieved      PT SHORT TERM GOAL #3   Title  demonstrate Rt knee A/ROM flexion to > or = to 95 degrees to allow for sitting and walking without substitution    Status  Achieved      PT SHORT TERM  GOAL #4   Title  -        PT Long Term Goals - 04/15/19 1300      PT LONG TERM GOAL #1   Title  Pt will be ind in advanced HEP    Time  6    Period  Weeks    Status  New    Target Date  05/27/19      PT LONG TERM GOAL #2   Title  reduce FOTO to </= 30% to demo less limitation    Baseline  33%    Time  6    Period  Weeks    Status  New    Target Date  05/27/19      PT LONG TERM GOAL #3   Title  Pt will achieve at least 115 deg Rt knee flexion to improve ability to squat and rise from floor    Time  6    Period  Weeks    Status  New    Target Date  05/27/19      PT LONG TERM GOAL #4   Title  Pt will be able to demo strategy for floor to stand transfer in the event of a fall.    Time  6    Period  Weeks    Status  New    Target Date  05/27/19      PT LONG TERM GOAL #5   Title  Pt will demo flexibility throughout bil LEs to [facility] to enable greater range for squatting/stooping for gardening and yard work    Time  6    Period  Weeks    Status  New    Target Date  05/27/19            Plan - 04/29/19 1013    Clinical Impression Statement  Pt with much improved ROM from initial visit.  Pt arrives with 1/10 pain in knee.  He  needed review of soleus stretch from last appt.  PT added backward stepping lunges and supine SLR with 2# ankle weights today. Pt now able to squat to pick up 10# box from floor height vs from riser.  Continue along POC with progression as tol for improved range and strength of LEs and knee.    Rehab Potential  Excellent    PT Frequency  2x / week    PT Duration  6 weeks    PT Treatment/Interventions  ADLs/Self Care Home Management;Cryotherapy;Electrical Stimulation;Moist Heat;Gait training;Stair training;Functional mobility training;Therapeutic activities;Therapeutic exercise;Balance training;Neuromuscular re-education;Manual techniques;Patient/family education;Passive range of motion;Dry needling;Taping;Spinal Manipulations;Joint Manipulations    PT  Next Visit Plan  f/u on HEP compliance, progress HEP, continue lunge/squat progression and sit to stands weighted, try leg press?    PT Home Exercise Plan  Access Code: [email or web address]    Consulted and Agree with Plan of Care  Patient       Patient will benefit from skilled therapeutic intervention in order to improve the following deficits and impairments:  Decreased range of motion, Decreased endurance, Pain, Decreased balance, Hypomobility, Impaired flexibility, Improper body mechanics, Decreased strength, Decreased mobility  Visit Diagnosis: Muscle weakness (generalized)  Stiffness of right knee, not elsewhere classified  Chronic pain of right knee  Acute pain of right knee  Other abnormalities of gait and mobility  Localized edema     Problem List There are no problems to display for this patient.   Venetia Night Kaled Allende, PT 04/29/19 10:16 AM   [facility] [address] [place], [place], [address] Phone: [phone]   Fax:  [phone]  Name: [name] MRN: [id number] Date of Birth: 10/16/1955

## 2019-05-04 ENCOUNTER — Ambulatory Visit: Payer: Medicare HMO | Admitting: Physical Therapy

## 2019-05-04 ENCOUNTER — Other Ambulatory Visit: Payer: Self-pay

## 2019-05-04 ENCOUNTER — Encounter: Payer: Self-pay | Admitting: Physical Therapy

## 2019-05-04 DIAGNOSIS — R2689 Other abnormalities of gait and mobility: Secondary | ICD-10-CM

## 2019-05-04 DIAGNOSIS — M25661 Stiffness of right knee, not elsewhere classified: Secondary | ICD-10-CM | POA: Diagnosis not present

## 2019-05-04 DIAGNOSIS — G8929 Other chronic pain: Secondary | ICD-10-CM

## 2019-05-04 DIAGNOSIS — M6281 Muscle weakness (generalized): Secondary | ICD-10-CM

## 2019-05-04 DIAGNOSIS — M25561 Pain in right knee: Secondary | ICD-10-CM

## 2019-05-04 DIAGNOSIS — R600 Localized edema: Secondary | ICD-10-CM

## 2019-05-04 NOTE — Therapy (Signed)
[facility] [address] [place], [place], [address] Phone: [phone]   Fax:  [phone]  Physical Therapy Treatment  Patient Details  Name: [name] MRN: [id number] Date of Birth: [birth year] Referring Provider (PT): Marybelle Killings, MD   Encounter Date: 05/04/2019  PT End of Session - 05/04/19 0940    Visit Number  5    Date for PT Re-Evaluation  05/27/19    Authorization Type  [employer]    Authorization - Visit Number  5    Authorization - Number of Visits  12    PT Start Time  [id number]    PT Stop Time  1016    PT Time Calculation (min)  41 min    Activity Tolerance  Patient tolerated treatment well    Behavior During Therapy  [facility] for tasks assessed/performed       Past Medical History:  Diagnosis Date  . Acid reflux   . Arthritis   . Depression   . Glaucoma   . Hypertension     Past Surgical History:  Procedure Laterality Date  . ANKLE FRACTURE SURGERY     states broken leg not ankle  . COLONOSCOPY    . KNEE SURGERY    . 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  Subjective Assessment - 05/04/19 0941    Subjective  I a definitely getting better.    Pertinent History  Rt TKR 07/2017    Currently in Pain?  Yes    Pain Score  1     Pain Location  Knee    Pain Orientation  Right    Pain Descriptors / Indicators  Dull    Multiple Pain Sites  No         [facility] PT Assessment - 05/04/19 0001      AROM   Right Knee Flexion  109   Passive 110 degrees                  [facility] Adult PT Treatment/Exercise - 05/04/19 0001      Therapeutic Activites    ADL's   On & off floor using mat cusihipon knee and LTUE surport 10x 2     Lifting  15# floor to waist 2x 10      Knee/Hip Exercises:  Stretches   Gastroc Stretch  Both;2 reps;20 seconds    Soleus Stretch  Both;2 reps;20 seconds      Knee/Hip Exercises: Aerobic   Nustep  L3 x 10 min       Knee/Hip Exercises: Machines for Strengthening   Cybex Leg Press  Seat 6 Bil 70# 2x10, RTLE 30# 10x      Knee/Hip Exercises: Seated   Long Arc Quad  Strengthening;Both;2 sets;10 reps;Weights    Long Arc Quad Weight  5 lbs.    Long [employer] Limitations  with ball squeeze               PT Short Term Goals - 04/29/19 0939      PT SHORT TERM GOAL #1   Title  Pt will be independent in initial HEP    Status  Achieved      PT SHORT TERM GOAL #2   Title  demonstrate Rt knee A/ROM extension to lacking <  or = to 10 degrees to normalize gait pattern    Status  Achieved      PT SHORT TERM GOAL #3   Title  demonstrate Rt knee A/ROM flexion to > or = to 95 degrees to allow for sitting and walking without substitution    Status  Achieved      PT SHORT TERM GOAL #4   Title  -        PT Long Term Goals - 04/15/19 1300      PT LONG TERM GOAL #1   Title  Pt will be ind in advanced HEP    Time  6    Period  Weeks    Status  New    Target Date  05/27/19      PT LONG TERM GOAL #2   Title  reduce FOTO to </= 30% to demo less limitation    Baseline  33%    Time  6    Period  Weeks    Status  New    Target Date  05/27/19      PT LONG TERM GOAL #3   Title  Pt will achieve at least 115 deg Rt knee flexion to improve ability to squat and rise from floor    Time  6    Period  Weeks    Status  New    Target Date  05/27/19      PT LONG TERM GOAL #4   Title  Pt will be able to demo strategy for floor to stand transfer in the event of a fall.    Time  6    Period  Weeks    Status  New    Target Date  05/27/19      PT LONG TERM GOAL #5   Title  Pt will demo flexibility throughout bil LEs to [facility] to enable greater range for squatting/stooping for gardening and yard work    Time  6    Period  Weeks    Status  New     Target Date  05/27/19            Plan - 05/04/19 0940    Clinical Impression Statement  "I am loosening up" pt reports. Pt feels increased mobility in his knee and ankles have helped him bend better and easier. Pt was able to tolerate the leg press but he was noticably out of breath throughout the session. Pt's knee AROM was 109 degrees abd PROM was 110 degrees. Pt attempted sitting on the floor but his bil hip flexibility was more a deterent than his RT knee. Once he can get on all 4s he can come to standing using the mat to push up on, but getting down was still difficult.    Personal Factors and Comorbidities  Fitness    Examination-Activity Limitations  Transfers;Squat;Stairs;Bend    [employer] Activity;Yard Work    Stability/Clinical Decision Making  Stable/Uncomplicated    Rehab Potential  Excellent    PT Frequency  2x / week    PT Duration  6 weeks    PT Treatment/Interventions  ADLs/Self Care Home Management;Cryotherapy;Electrical Stimulation;Moist Heat;Gait training;Stair training;Functional mobility training;Therapeutic activities;Therapeutic exercise;Balance training;Neuromuscular re-education;Manual techniques;Patient/family education;Passive range of motion;Dry needling;Taping;Spinal Manipulations;Joint Manipulations    PT Next Visit Plan  Continue with knee ROM and strength and functionally getting on & off the floor.    PT Home Exercise Plan  Access Code: [email or web address]    Consulted and Agree  with Plan of Care  Patient       Patient will benefit from skilled therapeutic intervention in order to improve the following deficits and impairments:  Decreased range of motion, Decreased endurance, Pain, Decreased balance, Hypomobility, Impaired flexibility, Improper body mechanics, Decreased strength, Decreased mobility  Visit Diagnosis: Muscle weakness (generalized)  Stiffness of right knee, not elsewhere classified  Chronic pain of right  knee  Acute pain of right knee  Other abnormalities of gait and mobility  Localized edema     Problem List There are no problems to display for this patient.   Yeilin Zweber, PTA 05/04/2019, 10:57 AM  [facility] [address], [place], [place], [address] Phone: [phone]   Fax:  [phone]  Name: [name] MRN: [id number] Date of Birth: May 28, 1955

## 2019-05-06 ENCOUNTER — Ambulatory Visit: Payer: Medicare HMO | Admitting: Physical Therapy

## 2019-05-06 ENCOUNTER — Encounter: Payer: Self-pay | Admitting: Physical Therapy

## 2019-05-06 ENCOUNTER — Other Ambulatory Visit: Payer: Self-pay

## 2019-05-06 DIAGNOSIS — M25661 Stiffness of right knee, not elsewhere classified: Secondary | ICD-10-CM

## 2019-05-06 DIAGNOSIS — R2689 Other abnormalities of gait and mobility: Secondary | ICD-10-CM

## 2019-05-06 DIAGNOSIS — M25561 Pain in right knee: Secondary | ICD-10-CM

## 2019-05-06 DIAGNOSIS — R600 Localized edema: Secondary | ICD-10-CM

## 2019-05-06 DIAGNOSIS — G8929 Other chronic pain: Secondary | ICD-10-CM

## 2019-05-06 DIAGNOSIS — M6281 Muscle weakness (generalized): Secondary | ICD-10-CM

## 2019-05-06 NOTE — Therapy (Signed)
[facility] [address] [place], [place], [address] Phone: [phone]   Fax:  [phone]  Physical Therapy Treatment  Patient Details  Name: [name] MRN: [id number] Date of Birth: 12/05/1955 Referring Provider (PT): Marybelle Killings, MD   Encounter Date: 05/06/2019  PT End of Session - 05/06/19 0928    Visit Number  6    Date for PT Re-Evaluation  05/27/19    Authorization Type  [employer]    Authorization Time Period  04/22/19-05/27/19    Authorization - Visit Number  6    Authorization - Number of Visits  12    PT Start Time  0928    PT Stop Time  1015    PT Time Calculation (min)  47 min    Activity Tolerance  Patient tolerated treatment well    Behavior During Therapy  [facility] for tasks assessed/performed       Past Medical History:  Diagnosis Date  . Acid reflux   . Arthritis   . Depression   . Glaucoma   . Hypertension     Past Surgical History:  Procedure Laterality Date  . ANKLE FRACTURE SURGERY     states broken leg not ankle  . COLONOSCOPY    . KNEE SURGERY    . TOTAL KNEE ARTHROPLASTY Right 07/12/2017   Procedure: RIGHT TOTAL KNEE ARTHROPLASTY -CEMENTED;  Surgeon: Marybelle Killings, MD;  Location: [place];  Service: Orthopedics;  Laterality: Right;    There were no vitals filed for this visit.  Subjective Assessment - 05/06/19 0929    Subjective  I was able to get "down" yesterday much better/easier.    Pertinent History  Rt TKR 07/2017    Limitations  Other (comment)    Currently in Pain?  No/denies    Multiple Pain Sites  No         [facility] PT Assessment - 05/06/19 0001      AROM   Right Knee Flexion  109   Passive 110 degrees                  [facility] Adult PT Treatment/Exercise - 05/06/19 0001      Therapeutic Activites    ADL's   On & off floor using mat cusihipon knee and LTUE surport 10x 2     Lifting  15# floor to waist 2x 10      Knee/Hip Exercises: Aerobic    Nustep  L4 x 10 min with PT apresent for status update      Knee/Hip Exercises: Machines for Strengthening   Cybex Leg Press  Seat 5 ( moved up) 75# 2x10, RTLE 35# 2x10       Knee/Hip Exercises: Standing   Lateral Step Up  Right;1 set;20 reps;Hand Hold: 1;Hand Hold: 0;Step Height: 6"    SLS  Bil 3x 10 on mini tramp 2 finger support      Knee/Hip Exercises: Seated   Long Arc Quad  Strengthening;Right;2 sets;15 reps;Weights    Long Arc Quad Weight  5 lbs.    Long Arc Quad Limitations  with ball squeeze               PT Short Term Goals - 04/29/19 0939      PT SHORT TERM GOAL #1   Title  Pt will be independent in initial HEP    Status  Achieved      PT SHORT TERM GOAL #2   Title  demonstrate Rt knee A/ROM extension to lacking < or = to 10 degrees to normalize gait pattern    Status  Achieved      PT SHORT TERM GOAL #3   Title  demonstrate Rt knee A/ROM flexion to > or = to 95 degrees to allow for sitting and walking without substitution    Status  Achieved      PT SHORT TERM GOAL #4   Title  -        PT Long Term Goals - 04/15/19 1300      PT LONG TERM GOAL #1   Title  Pt will be ind in advanced HEP    Time  6    Period  Weeks    Status  New    Target Date  05/27/19      PT LONG TERM GOAL #2   Title  reduce FOTO to </= 30% to demo less limitation    Baseline  33%    Time  6    Period  Weeks    Status  New    Target Date  05/27/19      PT LONG TERM GOAL #3   Title  Pt will achieve at least 115 deg Rt knee flexion to improve ability to squat and rise from floor    Time  6    Period  Weeks    Status  New    Target Date  05/27/19      PT LONG TERM GOAL #4   Title  Pt will be able to demo strategy for floor to stand transfer in the event of a fall.    Time  6    Period  Weeks    Status  New    Target Date  05/27/19      PT LONG TERM GOAL #5   Title  Pt will demo flexibility throughout bil LEs to [facility] to enable greater range for squatting/stooping  for gardening and yard work    Time  6    Period  Weeks    Status  New    Target Date  05/27/19            Plan - 05/06/19 1009    Clinical Impression Statement  Pt feeling more confident with bending his knee and "getting down." He  has maintained 110 degrees of PROM for knee flexion this week. Pt was able to increase resistance on the leg press today without any issues including moving seat up to ensure more knee flexion.    Personal Factors and Comorbidities  Fitness    Examination-Activity Limitations  Transfers;Squat;Stairs;Bend    [employer] Activity;Yard Work    Stability/Clinical Decision Making  Stable/Uncomplicated    Rehab Potential  Excellent    PT Frequency  2x / week    PT Duration  6 weeks    PT Treatment/Interventions  ADLs/Self Care Home Management;Cryotherapy;Electrical Stimulation;Moist Heat;Gait training;Stair training;Functional mobility training;Therapeutic activities;Therapeutic exercise;Balance training;Neuromuscular re-education;Manual techniques;Patient/family education;Passive range of motion;Dry needling;Taping;Spinal Manipulations;Joint Manipulations    PT Next Visit Plan  Continue with knee ROM and strength and functionally getting on & off the floor.    PT Home Exercise Plan  Access Code: [email or web address]    Consulted and Agree with Plan of Care  Patient       Patient will benefit from skilled therapeutic intervention in order to improve the following deficits and impairments:  Decreased range of motion, Decreased endurance, Pain, Decreased balance, Hypomobility, Impaired  flexibility, Improper body mechanics, Decreased strength, Decreased mobility  Visit Diagnosis: Muscle weakness (generalized)  Stiffness of right knee, not elsewhere classified  Chronic pain of right knee  Acute pain of right knee  Other abnormalities of gait and mobility  Localized edema     Problem List There are no problems to display for  this patient.   COCHRAN,JENNIFER, PTA 05/06/2019, 10:25 AM  [facility] [address] [place], [place], [address] Phone: [phone]   Fax:  [phone]  Name: [name] MRN: [id number] Date of Birth: Feb 10, 1956

## 2019-05-11 ENCOUNTER — Other Ambulatory Visit: Payer: Self-pay

## 2019-05-11 ENCOUNTER — Ambulatory Visit: Payer: Medicare HMO | Attending: Orthopaedic Surgery | Admitting: Physical Therapy

## 2019-05-11 ENCOUNTER — Encounter: Payer: Self-pay | Admitting: Physical Therapy

## 2019-05-11 DIAGNOSIS — G8929 Other chronic pain: Secondary | ICD-10-CM | POA: Insufficient documentation

## 2019-05-11 DIAGNOSIS — M25561 Pain in right knee: Secondary | ICD-10-CM | POA: Insufficient documentation

## 2019-05-11 DIAGNOSIS — R2689 Other abnormalities of gait and mobility: Secondary | ICD-10-CM | POA: Insufficient documentation

## 2019-05-11 DIAGNOSIS — M6281 Muscle weakness (generalized): Secondary | ICD-10-CM | POA: Diagnosis present

## 2019-05-11 DIAGNOSIS — M25661 Stiffness of right knee, not elsewhere classified: Secondary | ICD-10-CM | POA: Diagnosis present

## 2019-05-11 DIAGNOSIS — R600 Localized edema: Secondary | ICD-10-CM | POA: Insufficient documentation

## 2019-05-11 NOTE — Therapy (Signed)
[facility] [address], [place], [place], [address] Phone: [phone]   Fax:  [phone]  Physical Therapy Treatment  Patient Details  Name: [name] MRN: [id number] Date of Birth: Feb 10, 1956 Referring Provider (PT): Marybelle Killings, MD   Encounter Date: 05/11/2019  PT End of Session - 05/11/19 1059    Visit Number  7    Date for PT Re-Evaluation  05/27/19    Authorization Type  [employer]    Authorization Time Period  04/22/19-05/27/19    Authorization - Visit Number  7    Authorization - Number of Visits  12    PT Start Time  [id number]    PT Stop Time  1136    PT Time Calculation (min)  38 min    Activity Tolerance  Patient tolerated treatment well    Behavior During Therapy  [facility] for tasks assessed/performed       Past Medical History:  Diagnosis Date  . Acid reflux   . Arthritis   . Depression   . Glaucoma   . Hypertension     Past Surgical History:  Procedure Laterality Date  . ANKLE FRACTURE SURGERY     states broken leg not ankle  . COLONOSCOPY    . KNEE SURGERY    . TOTAL KNEE ARTHROPLASTY Right 07/12/2017   Procedure: RIGHT TOTAL KNEE ARTHROPLASTY -CEMENTED;  Surgeon: Marybelle Killings, MD;  Location: [place];  Service: Orthopedics;  Laterality: Right;    There were no vitals filed for this visit.  Subjective Assessment - 05/11/19 1101    Subjective  Felt ok after last sesison.    Pertinent History  Rt TKR 07/2017    Pain Score  1     Pain Location  Knee    Pain Orientation  Right    Pain Descriptors / Indicators  Dull;Aching    Aggravating Factors   Getting up from squatting    Pain Relieving Factors  ice    Multiple Pain Sites  No         [facility] PT Assessment - 05/11/19 0001      AROM   Right Knee Flexion  109   PROM 110                  [facility] Adult PT Treatment/Exercise - 05/11/19 0001      Therapeutic Activites    ADL's   On & off floor using mat cusihipon knee  and LTUE surport 10x 2     Lifting  15# floor to waist 3 x 10      Knee/Hip Exercises: Aerobic   Nustep  L4 x 10 min with PT apresent for status update      Knee/Hip Exercises: Machines for Strengthening   Cybex Leg Press  Seat 5 Bil 80# 2x10, RTLE 40# 3x10      Knee/Hip Exercises: Standing   SLS  4x10 sec on black pad with balance poles       Knee/Hip Exercises: Seated   Long Arc Quad  Strengthening;Right;3 sets;10 reps;Weights    Long Arc Quad Weight  6 lbs.    Long Arc Quad Limitations  with ball squeeze               PT Short Term Goals - 04/29/19 0939      PT SHORT TERM GOAL #1   Title  Pt will be independent in initial HEP    Status  Achieved  PT SHORT TERM GOAL #2   Title  demonstrate Rt knee A/ROM extension to lacking < or = to 10 degrees to normalize gait pattern    Status  Achieved      PT SHORT TERM GOAL #3   Title  demonstrate Rt knee A/ROM flexion to > or = to 95 degrees to allow for sitting and walking without substitution    Status  Achieved      PT SHORT TERM GOAL #4   Title  -        PT Long Term Goals - 04/15/19 1300      PT LONG TERM GOAL #1   Title  Pt will be ind in advanced HEP    Time  6    Period  Weeks    Status  New    Target Date  05/27/19      PT LONG TERM GOAL #2   Title  reduce FOTO to </= 30% to demo less limitation    Baseline  33%    Time  6    Period  Weeks    Status  New    Target Date  05/27/19      PT LONG TERM GOAL #3   Title  Pt will achieve at least 115 deg Rt knee flexion to improve ability to squat and rise from floor    Time  6    Period  Weeks    Status  New    Target Date  05/27/19      PT LONG TERM GOAL #4   Title  Pt will be able to demo strategy for floor to stand transfer in the event of a fall.    Time  6    Period  Weeks    Status  New    Target Date  05/27/19      PT LONG TERM GOAL #5   Title  Pt will demo flexibility throughout bil LEs to [facility] to enable greater range for  squatting/stooping for gardening and yard work    Time  6    Period  Weeks    Status  New    Target Date  05/27/19            Plan - 05/11/19 1113    Clinical Impression Statement  Pt arrives today for PT with min pain. He reports his knee is "staying looser and not tightening back up." He was able to increase his resistance on the leg press with no issues. Pt achieved 109 degrees more easily than last measurement and the 110 Passive flexion only required slight overpressure to proximal femur.    Personal Factors and Comorbidities  Fitness    Examination-Activity Limitations  Transfers;Squat;Stairs;Bend    [employer] Activity;Yard Work    Stability/Clinical Decision Making  Stable/Uncomplicated    Rehab Potential  Excellent    PT Frequency  2x / week    PT Duration  6 weeks    PT Treatment/Interventions  ADLs/Self Care Home Management;Cryotherapy;Electrical Stimulation;Moist Heat;Gait training;Stair training;Functional mobility training;Therapeutic activities;Therapeutic exercise;Balance training;Neuromuscular re-education;Manual techniques;Patient/family education;Passive range of motion;Dry needling;Taping;Spinal Manipulations;Joint Manipulations    PT Next Visit Plan  Continue with knee ROM and strength and functionally getting on & off the floor.    PT Home Exercise Plan  Access Code: [email or web address]    Consulted and Agree with Plan of Care  Patient       Patient will benefit from skilled therapeutic intervention in order to improve  the following deficits and impairments:  Decreased range of motion, Decreased endurance, Pain, Decreased balance, Hypomobility, Impaired flexibility, Improper body mechanics, Decreased strength, Decreased mobility  Visit Diagnosis: Muscle weakness (generalized)  Stiffness of right knee, not elsewhere classified  Chronic pain of right knee  Acute pain of right knee  Other abnormalities of gait and  mobility  Localized edema     Problem List There are no problems to display for this patient.   Trang Bouse, PTA 05/11/2019, 11:39 AM  [facility] [address], [place], [place], [address] Phone: [phone]   Fax:  [phone]  Name: NICHOLOS HUAMAN MRN: [id number] Date of Birth: [birth year]

## 2019-05-13 ENCOUNTER — Ambulatory Visit: Payer: Medicare HMO | Admitting: Physical Therapy

## 2019-05-13 ENCOUNTER — Encounter: Payer: Self-pay | Admitting: Physical Therapy

## 2019-05-13 ENCOUNTER — Other Ambulatory Visit: Payer: Self-pay

## 2019-05-13 DIAGNOSIS — M25561 Pain in right knee: Secondary | ICD-10-CM

## 2019-05-13 DIAGNOSIS — G8929 Other chronic pain: Secondary | ICD-10-CM

## 2019-05-13 DIAGNOSIS — M25661 Stiffness of right knee, not elsewhere classified: Secondary | ICD-10-CM

## 2019-05-13 DIAGNOSIS — R600 Localized edema: Secondary | ICD-10-CM

## 2019-05-13 DIAGNOSIS — M6281 Muscle weakness (generalized): Secondary | ICD-10-CM | POA: Diagnosis not present

## 2019-05-13 DIAGNOSIS — R2689 Other abnormalities of gait and mobility: Secondary | ICD-10-CM

## 2019-05-13 NOTE — Therapy (Signed)
[facility] [address], [place], [place], [address] Phone: [phone]   Fax:  [phone]  Physical Therapy Treatment  Patient Details  Name: [name] MRN: [id number] Date of Birth: 1956/02/05 Referring Provider (PT): Marybelle Killings, MD   Encounter Date: 05/13/2019  PT End of Session - 05/13/19 0933    Visit Number  8    Date for PT Re-Evaluation  05/27/19    Authorization Type  [employer]    Authorization Time Period  04/22/19-05/27/19    Authorization - Visit Number  8    Authorization - Number of Visits  12    PT Start Time  0930    PT Stop Time  1013    PT Time Calculation (min)  43 min    Activity Tolerance  Patient tolerated treatment well    Behavior During Therapy  [facility] for tasks assessed/performed       Past Medical History:  Diagnosis Date  . Acid reflux   . Arthritis   . Depression   . Glaucoma   . Hypertension     Past Surgical History:  Procedure Laterality Date  . ANKLE FRACTURE SURGERY     states broken leg not ankle  . COLONOSCOPY    . KNEE SURGERY    . TOTAL KNEE ARTHROPLASTY Right 07/12/2017   Procedure: RIGHT TOTAL KNEE ARTHROPLASTY -CEMENTED;  Surgeon: Marybelle Killings, MD;  Location: [place];  Service: Orthopedics;  Laterality: Right;    There were no vitals filed for this visit.  Subjective Assessment - 05/13/19 0935    Subjective  My knee is a litle more achey today probably from the weather.    Pertinent History  Rt TKR 07/2017    Currently in Pain?  Yes    Pain Score  3     Pain Location  Knee    Pain Orientation  Right    Pain Descriptors / Indicators  Dull;Aching    Multiple Pain Sites  No                       [facility] Adult PT Treatment/Exercise - 05/13/19 0001      Therapeutic Activites    Lifting  17# floor to waist 3 x 10      Knee/Hip Exercises: Aerobic   Stationary Bike  At end of session 5 min L1 Vc to maintain RPMS > 30    Nustep  L4 x  10 min with PT apresent for status update      Knee/Hip Exercises: Machines for Strengthening   Cybex Leg Press  Seat 5 Bil 80# 2x15, RTLE 40# 2x15       Knee/Hip Exercises: Seated   Long Arc Quad  Strengthening;Right;3 sets;10 reps;Weights    Long Arc Quad Weight  6 lbs.    Long [employer] Limitations  with ball squeeze               PT Short Term Goals - 04/29/19 0939      PT SHORT TERM GOAL #1   Title  Pt will be independent in initial HEP    Status  Achieved      PT SHORT TERM GOAL #2   Title  demonstrate Rt knee A/ROM extension to lacking < or = to 10 degrees to normalize gait pattern    Status  Achieved      PT SHORT TERM GOAL #3   Title  demonstrate  Rt knee A/ROM flexion to > or = to 95 degrees to allow for sitting and walking without substitution    Status  Achieved      PT SHORT TERM GOAL #4   Title  -        PT Long Term Goals - 04/15/19 1300      PT LONG TERM GOAL #1   Title  Pt will be ind in advanced HEP    Time  6    Period  Weeks    Status  New    Target Date  05/27/19      PT LONG TERM GOAL #2   Title  reduce FOTO to </= 30% to demo less limitation    Baseline  33%    Time  6    Period  Weeks    Status  New    Target Date  05/27/19      PT LONG TERM GOAL #3   Title  Pt will achieve at least 115 deg Rt knee flexion to improve ability to squat and rise from floor    Time  6    Period  Weeks    Status  New    Target Date  05/27/19      PT LONG TERM GOAL #4   Title  Pt will be able to demo strategy for floor to stand transfer in the event of a fall.    Time  6    Period  Weeks    Status  New    Target Date  05/27/19      PT LONG TERM GOAL #5   Title  Pt will demo flexibility throughout bil LEs to [facility] to enable greater range for squatting/stooping for gardening and yard work    Time  6    Period  Weeks    Status  New    Target Date  05/27/19            Plan - 05/13/19 0933    Clinical Impression Statement  Pt reports today  his knee is feeling a little more achey than usual . He believes this is mainly do to the weather. This did not limit his session as pt was able to increase his reps with all his exercises including riding the staionary bike which challenged his knee flexion. Pt measured 110 degrees easy then PTA was able to apply a downward force to proximl femur to get 113 Passive flexion.    Personal Factors and Comorbidities  Fitness    Examination-Activity Limitations  Transfers;Squat;Stairs;Bend    [employer] Activity;Yard Work    Stability/Clinical Decision Making  Stable/Uncomplicated    Rehab Potential  Excellent    PT Frequency  2x / week    PT Duration  6 weeks    PT Treatment/Interventions  ADLs/Self Care Home Management;Cryotherapy;Electrical Stimulation;Moist Heat;Gait training;Stair training;Functional mobility training;Therapeutic activities;Therapeutic exercise;Balance training;Neuromuscular re-education;Manual techniques;Patient/family education;Passive range of motion;Dry needling;Taping;Spinal Manipulations;Joint Manipulations    PT Next Visit Plan  Continue with knee ROM and strength and functionally getting on & off the floor.    PT Home Exercise Plan  Access Code: [email or web address]    Consulted and Agree with Plan of Care  Patient       Patient will benefit from skilled therapeutic intervention in order to improve the following deficits and impairments:  Decreased range of motion, Decreased endurance, Pain, Decreased balance, Hypomobility, Impaired flexibility, Improper body mechanics, Decreased strength, Decreased mobility  Visit Diagnosis: Muscle  weakness (generalized)  Stiffness of right knee, not elsewhere classified  Chronic pain of right knee  Acute pain of right knee  Other abnormalities of gait and mobility  Localized edema     Problem List There are no problems to display for this patient.   Ravynn Hogate,PTA 05/13/2019, 10:16  AM  [facility] [address] [place], [place], [address] Phone: [phone]   Fax:  [phone]  Name: [name] MRN: [id number] Date of Birth: 1956-01-08

## 2019-05-18 ENCOUNTER — Other Ambulatory Visit: Payer: Self-pay

## 2019-05-18 ENCOUNTER — Ambulatory Visit: Payer: Medicare HMO | Admitting: Physical Therapy

## 2019-05-18 ENCOUNTER — Encounter: Payer: Self-pay | Admitting: Physical Therapy

## 2019-05-18 DIAGNOSIS — M6281 Muscle weakness (generalized): Secondary | ICD-10-CM | POA: Diagnosis not present

## 2019-05-18 DIAGNOSIS — M25561 Pain in right knee: Secondary | ICD-10-CM

## 2019-05-18 DIAGNOSIS — R2689 Other abnormalities of gait and mobility: Secondary | ICD-10-CM

## 2019-05-18 DIAGNOSIS — M25661 Stiffness of right knee, not elsewhere classified: Secondary | ICD-10-CM

## 2019-05-18 DIAGNOSIS — G8929 Other chronic pain: Secondary | ICD-10-CM

## 2019-05-18 NOTE — Therapy (Signed)
[facility] [address] [place], [place], [address] Phone: [phone]   Fax:  [phone]  Physical Therapy Treatment  Patient Details  Name: [name] MRN: [id number] Date of Birth: [birth year] Referring Provider (PT): Marybelle Killings, MD   Encounter Date: 05/18/2019  PT End of Session - 05/18/19 0938    Visit Number  9    Date for PT Re-Evaluation  05/27/19    Authorization Type  [employer]    Authorization Time Period  04/22/19-05/27/19    Authorization - Visit Number  9    Authorization - Number of Visits  12    PT Start Time  0930    PT Stop Time  1015    PT Time Calculation (min)  45 min    Activity Tolerance  Patient tolerated treatment well    Behavior During Therapy  [facility] for tasks assessed/performed       Past Medical History:  Diagnosis Date  . Acid reflux   . Arthritis   . Depression   . Glaucoma   . Hypertension     Past Surgical History:  Procedure Laterality Date  . ANKLE FRACTURE SURGERY     states broken leg not ankle  . COLONOSCOPY    . KNEE SURGERY    . TOTAL KNEE ARTHROPLASTY Right 07/12/2017   Procedure: RIGHT TOTAL KNEE ARTHROPLASTY -CEMENTED;  Surgeon: Marybelle Killings, MD;  Location: [place];  Service: Orthopedics;  Laterality: Right;    There were no vitals filed for this visit.  Subjective Assessment - 05/18/19 0935    Subjective  I can now get up from the floor or ground as long as I have something to prop my upper body on.  Knee still feels a little achey.    Pertinent History  Rt TKR 07/2017    How long can you walk comfortably?  1 mile    Patient Stated Goals  Rt knee strength, be able to get up from squat/bend/floor for confidence to walk on trails    Currently in Pain?  Yes    Pain Score  2     Pain Location  Knee    Pain Orientation  Right    Pain Descriptors / Indicators  Dull;Aching    Pain Type  Chronic pain    Pain Onset  More than a month ago    Pain  Frequency  Intermittent    Aggravating Factors   getting up from the ground    Effect of Pain on Daily Activities  yard work         [facility] PT Assessment - 05/18/19 0001      Observation/Other Assessments   Focus on Therapeutic Outcomes (FOTO)   35%                   [facility] Adult PT Treatment/Exercise - 05/18/19 0001      Exercises   Exercises  Knee/Hip      Knee/Hip Exercises: Stretches   Other Knee/Hip Stretches  Rt quad stretch by PT in Lt SL with contract/relax      Knee/Hip Exercises: Aerobic   Nustep  L4 x 10 min, PT present to review goals, FOTO      Knee/Hip Exercises: Machines for Strengthening   Cybex Leg Press  Seat 5 Bil 80# 1x10, RTLE 40# 2x10      Knee/Hip Exercises: Standing   Other Standing Knee Exercises  box pick up squats from floor  18# + box 10# 1x10    Other Standing Knee Exercises  red ball against wall squats x 10 reps holding 10# dumbbell      Knee/Hip Exercises: Seated   Long Arc Quad  Strengthening;2 sets;10 reps;Weights;Right;Left    Long Arc Quad Weight  6 lbs.    Long Arc Quad Limitations  with ball squeeze      Manual Therapy   Manual Therapy  Soft tissue mobilization    Soft tissue mobilization  Rt quad intermedius and lateralis               PT Short Term Goals - 04/29/19 0939      PT SHORT TERM GOAL #1   Title  Pt will be independent in initial HEP    Status  Achieved      PT SHORT TERM GOAL #2   Title  demonstrate Rt knee A/ROM extension to lacking < or = to 10 degrees to normalize gait pattern    Status  Achieved      PT SHORT TERM GOAL #3   Title  demonstrate Rt knee A/ROM flexion to > or = to 95 degrees to allow for sitting and walking without substitution    Status  Achieved      PT SHORT TERM GOAL #4   Title  -        PT Long Term Goals - 05/18/19 1749      PT LONG TERM GOAL #1   Title  Pt will be ind in advanced HEP    Status  On-going      PT LONG TERM GOAL #3   Title  Pt will achieve at  least 115 deg Rt knee flexion to improve ability to squat and rise from floor    Baseline  112    Status  On-going      PT LONG TERM GOAL #4   Title  Pt will be able to demo strategy for floor to stand transfer in the event of a fall.    Status  Achieved      PT LONG TERM GOAL #5   Title  Pt will demo flexibility throughout bil LEs to [facility] to enable greater range for squatting/stooping for gardening and yard work    Status  Achieved            Plan - 05/18/19 0943    Clinical Impression Statement  Pt has met several LTGs including knee flexion allowing deeper functional squatting and being able to demo getting up from floor in the event of a fall.  He is compliant with HEP at least 3x/week.  PT noted Rt quad trigger point today which after passive stretching and STM allowed for knee flexion to 115 deg vs 108 prior to these techniques.  Pt will continue to benefit from skilled PT for quad flexibility, knee ROM and functional strength with eventual d/c to HEP.    Examination-Participation Restrictions  Community Activity;Yard Work    Environmental manager    PT Frequency  2x / week    PT Duration  6 weeks    PT Treatment/Interventions  ADLs/Self Care Home Management;Cryotherapy;Electrical Stimulation;Moist Heat;Gait training;Stair training;Functional mobility training;Therapeutic activities;Therapeutic exercise;Balance training;Neuromuscular re-education;Manual techniques;Patient/family education;Passive range of motion;Dry needling;Taping;Spinal Manipulations;Joint Manipulations    PT Next Visit Plan  Rt quad STM and passive stretching, knee ROM and continued functional strength    PT Home Exercise Plan  Access Code: [email or web address]    Consulted and Agree with  Plan of Care  Patient       Patient will benefit from skilled therapeutic intervention in order to improve the following deficits and impairments:     Visit Diagnosis: Muscle weakness (generalized)  Stiffness of right knee, not  elsewhere classified  Chronic pain of right knee  Acute pain of right knee  Other abnormalities of gait and mobility     Problem List There are no problems to display for this patient.   Venetia Night San Lohmeyer, PT 05/18/19 10:16 AM   [facility] [address], [place], [place], [address] Phone: [phone]   Fax:  [phone]  Name: [name] MRN: [id number] Date of Birth: [birth year]

## 2019-05-20 ENCOUNTER — Encounter: Payer: Self-pay | Admitting: Physical Therapy

## 2019-05-20 ENCOUNTER — Other Ambulatory Visit: Payer: Self-pay

## 2019-05-20 ENCOUNTER — Ambulatory Visit: Payer: Medicare HMO | Admitting: Physical Therapy

## 2019-05-20 DIAGNOSIS — R600 Localized edema: Secondary | ICD-10-CM

## 2019-05-20 DIAGNOSIS — M25561 Pain in right knee: Secondary | ICD-10-CM

## 2019-05-20 DIAGNOSIS — M25661 Stiffness of right knee, not elsewhere classified: Secondary | ICD-10-CM

## 2019-05-20 DIAGNOSIS — R2689 Other abnormalities of gait and mobility: Secondary | ICD-10-CM

## 2019-05-20 DIAGNOSIS — G8929 Other chronic pain: Secondary | ICD-10-CM

## 2019-05-20 DIAGNOSIS — M6281 Muscle weakness (generalized): Secondary | ICD-10-CM | POA: Diagnosis not present

## 2019-05-20 NOTE — Therapy (Addendum)
[facility] [address] [place], [place], [address] Phone: [phone]   Fax:  [phone]  Physical Therapy Treatment  Patient Details  Name: [name] MRN: [id number] Date of Birth: May 08, 1955 Referring Provider (PT): Marybelle Killings, MD   Progress Note Reporting Period 04/15/19 to 05/20/19  See note below for Objective Data and Assessment of Progress/Goals.      Encounter Date: 05/20/2019  PT End of Session - 05/20/19 0936    Visit Number  10    Date for PT Re-Evaluation  05/27/19    Authorization Type  [employer]    Authorization Time Period  04/22/19-05/27/19    Authorization - Visit Number  10    Authorization - Number of Visits  12    PT Start Time  0930    PT Stop Time  1008    PT Time Calculation (min)  38 min    Activity Tolerance  Patient tolerated treatment well    Behavior During Therapy  WFL for tasks assessed/performed       Past Medical History:  Diagnosis Date  . Acid reflux   . Arthritis   . Depression   . Glaucoma   . Hypertension     Past Surgical History:  Procedure Laterality Date  . ANKLE FRACTURE SURGERY     states broken leg not ankle  . COLONOSCOPY    . KNEE SURGERY    . TOTAL KNEE ARTHROPLASTY Right 07/12/2017   Procedure: RIGHT TOTAL KNEE ARTHROPLASTY -CEMENTED;  Surgeon: Marybelle Killings, MD;  Location: [facility];  Service: Orthopedics;  Laterality: Right;    There were no vitals filed for this visit.  Subjective Assessment - 05/20/19 0938    Subjective  My quads were really sore after last session. KNee doing ok, was able to plant all his onions with no problem.    Pertinent History  Rt TKR 07/2017    Currently in Pain?  Yes    Pain Location  Knee    Pain Orientation  Right    Pain Descriptors / Indicators  Dull;Aching    Multiple Pain Sites  No         [facility] PT Assessment - 05/20/19 0001      AROM   Right Knee Flexion  109   PROM 110                   [facility] Adult PT Treatment/Exercise - 05/20/19 0001      Therapeutic Activites    Lifting  20# floor to waist 3 x 10      Knee/Hip Exercises: Aerobic   Nustep  L4 x 10 min, PTA present to review goals, FOTO      Knee/Hip Exercises: Machines for Strengthening   Cybex Leg Press  Seat 5 Bil 85# 2x15, RTLE 40# 2x15      Knee/Hip Exercises: Seated   Long Arc Quad  Strengthening;Right;3 sets;10 reps;Weights    Long Arc Quad Weight  6 lbs.      Manual Therapy   Manual Therapy  Soft tissue mobilization    Soft tissue mobilization  Rt quad intermedius and lateralis               PT Short Term Goals - 05/20/19 1010      PT SHORT TERM GOAL #1   Title  Pt will be independent in initial HEP    Time  3    Period  Weeks  Status  Achieved    Target Date  05/06/19      PT SHORT TERM GOAL #2   Title  demonstrate Rt knee A/ROM extension to lacking < or = to 10 degrees to normalize gait pattern    Baseline  10 degrees    Time  4    Period  Weeks    Status  Achieved    Target Date  08/26/17      PT SHORT TERM GOAL #3   Title  demonstrate Rt knee A/ROM flexion to > or = to 95 degrees to allow for sitting and walking without substitution    Time  4    Period  Weeks    Status  Achieved        PT Long Term Goals - 05/20/19 1010      PT LONG TERM GOAL #1   Title  Pt will be ind in advanced HEP    Time  6    Period  Weeks    Status  On-going      PT LONG TERM GOAL #2   Title  reduce FOTO to </= 30% to demo less limitation    Baseline  33%    Time  6    Period  Weeks    Status  New      PT LONG TERM GOAL #3   Title  Pt will achieve at least 115 deg Rt knee flexion to improve ability to squat and rise from floor    Time  6    Period  Weeks    Status  On-going   Pt hitting 110 degrees pretty consistently and has recently achieved 115 degrees 1x this week. Until this is met more consistently we will say on going     PT LONG TERM GOAL #4    Title  Pt will be able to demo strategy for floor to stand transfer in the event of a fall.    Time  6    Period  Weeks    Status  Achieved      PT LONG TERM GOAL #5   Title  Pt will demo flexibility throughout bil LEs to [facility] to enable greater range for squatting/stooping for gardening and yard work    Time  6    Status  Achieved            Plan - 05/20/19 0936    Clinical Impression Statement  Todays session continues to work on soft tissue mobilization for the RT quad and gastroc. Pt able to increase resisatnce on the leg press today and perform squatting to lift 20# from floor to waist to simulate his gardening activities.    Personal Factors and Comorbidities  Fitness    Examination-Activity Limitations  Transfers;Squat;Stairs;Bend    [employer] Activity;Yard Work    Stability/Clinical Decision Making  Stable/Uncomplicated    Rehab Potential  Excellent    PT Frequency  2x / week    PT Duration  6 weeks    PT Treatment/Interventions  ADLs/Self Care Home Management;Cryotherapy;Electrical Stimulation;Moist Heat;Gait training;Stair training;Functional mobility training;Therapeutic activities;Therapeutic exercise;Balance training;Neuromuscular re-education;Manual techniques;Patient/family education;Passive range of motion;Dry needling;Taping;Spinal Manipulations;Joint Manipulations    PT Next Visit Plan  Rt quad STM and passive stretching, knee ROM and continued functional strength, DC probable next week at end of POC.    PT Home Exercise Plan  Access Code: [email or web address]    Consulted and Agree with Plan of Care  Patient  Patient will benefit from skilled therapeutic intervention in order to improve the following deficits and impairments:  Decreased range of motion, Decreased endurance, Pain, Decreased balance, Hypomobility, Impaired flexibility, Improper body mechanics, Decreased strength, Decreased mobility  Visit Diagnosis: Stiffness of  right knee, not elsewhere classified  Muscle weakness (generalized)  Chronic pain of right knee  Acute pain of right knee  Other abnormalities of gait and mobility  Localized edema     Problem List There are no problems to display for this patient.   Keondria Siever, PTA 05/20/2019, 10:13 AM  Baruch Merl, PT 05/20/19 12:56 PM    [facility] [address] [place], [place], [address] Phone: [phone]   Fax:  [phone]  Name: [name] MRN: [id number] Date of Birth: [birth year]

## 2019-05-25 ENCOUNTER — Ambulatory Visit: Payer: Medicare HMO | Admitting: Physical Therapy

## 2019-05-27 ENCOUNTER — Encounter: Payer: Self-pay | Admitting: Physical Therapy

## 2019-05-27 ENCOUNTER — Other Ambulatory Visit: Payer: Self-pay

## 2019-05-27 ENCOUNTER — Ambulatory Visit: Payer: Medicare HMO | Admitting: Physical Therapy

## 2019-05-27 DIAGNOSIS — G8929 Other chronic pain: Secondary | ICD-10-CM

## 2019-05-27 DIAGNOSIS — R2689 Other abnormalities of gait and mobility: Secondary | ICD-10-CM

## 2019-05-27 DIAGNOSIS — M6281 Muscle weakness (generalized): Secondary | ICD-10-CM

## 2019-05-27 DIAGNOSIS — M25561 Pain in right knee: Secondary | ICD-10-CM

## 2019-05-27 DIAGNOSIS — M25661 Stiffness of right knee, not elsewhere classified: Secondary | ICD-10-CM

## 2019-05-27 NOTE — Therapy (Signed)
[facility] [address], [place], [place], [address] Phone: [phone]   Fax:  [phone]  Physical Therapy Treatment  Patient Details  Name: [name] MRN: [id number] Date of Birth: [birth year] Referring Provider (PT): Marybelle Killings, MD   Encounter Date: 05/27/2019  PT End of Session - 05/27/19 0935    Visit Number  11    Date for PT Re-Evaluation  05/27/19    Authorization Type  [employer]    Authorization Time Period  04/22/19-05/27/19    Authorization - Visit Number  11    Authorization - Number of Visits  12    PT Start Time  0930    PT Stop Time  1008    PT Time Calculation (min)  38 min    Activity Tolerance  Patient tolerated treatment well    Behavior During Therapy  [facility] for tasks assessed/performed       Past Medical History:  Diagnosis Date  . Acid reflux   . Arthritis   . Depression   . Glaucoma   . Hypertension     Past Surgical History:  Procedure Laterality Date  . ANKLE FRACTURE SURGERY     states broken leg not ankle  . COLONOSCOPY    . KNEE SURGERY    . TOTAL KNEE ARTHROPLASTY Right 07/12/2017   Procedure: RIGHT TOTAL KNEE ARTHROPLASTY -CEMENTED;  Surgeon: Marybelle Killings, MD;  Location: [facility];  Service: Orthopedics;  Laterality: Right;    There were no vitals filed for this visit.  Subjective Assessment - 05/27/19 0933    Subjective  I'm ready to be discharged.  The knee is a little sore today.  I have to split wood today which will be a lot of bending so I don't want to do too much today so I have energy left.    Pertinent History  Rt TKR 07/2017    How long can you walk comfortably?  1 mile    Patient Stated Goals  Rt knee strength, be able to get up from squat/bend/floor for confidence to walk on trails    Currently in Pain?  Yes    Pain Score  3     Pain Location  Knee    Pain Orientation  Right    Pain Descriptors / Indicators  Dull;Aching    Pain Type  Chronic pain     Pain Onset  More than a month ago    Effect of Pain on Daily Activities  yard work                       [employer] Adult PT Treatment/Exercise - 05/27/19 0001      Therapeutic Activites    Lifting  20# floor to waist 3 x 10      Exercises   Exercises  Knee/Hip      Knee/Hip Exercises: Stretches   Active Hamstring Stretch  Both;2 reps;30 seconds    Active Hamstring Stretch Limitations  foot on second step    Gastroc Stretch  Both;2 reps;30 seconds    Other Knee/Hip Stretches  knee flexion A/ROM foot on second step on Rt x 20 reps      Knee/Hip Exercises: Aerobic   Nustep  L4 x 10 min, PT present       Knee/Hip Exercises: Machines for Strengthening   Cybex Leg Press  Seat 5 Bil 85# 2x15, RTLE 40# 2x15      Knee/Hip  Exercises: Seated   Long Arc Quad  Strengthening;Right;3 sets;10 reps;Weights    Long Arc Quad Weight  6 lbs.               PT Short Term Goals - 05/20/19 1010      PT SHORT TERM GOAL #1   Title  Pt will be independent in initial HEP    Time  3    Period  Weeks    Status  Achieved    Target Date  05/06/19      PT SHORT TERM GOAL #2   Title  demonstrate Rt knee A/ROM extension to lacking < or = to 10 degrees to normalize gait pattern    Baseline  10 degrees    Time  4    Period  Weeks    Status  Achieved    Target Date  08/26/17      PT SHORT TERM GOAL #3   Title  demonstrate Rt knee A/ROM flexion to > or = to 95 degrees to allow for sitting and walking without substitution    Time  4    Period  Weeks    Status  Achieved        PT Long Term Goals - 05/27/19 5009      PT LONG TERM GOAL #1   Title  Pt will be ind in advanced HEP    Status  Achieved      PT LONG TERM GOAL #2   Title  reduce FOTO to </= 30% to demo less limitation    Baseline  33%    Status  Partially Met      PT LONG TERM GOAL #3   Title  Pt will achieve at least 115 deg Rt knee flexion to improve ability to squat and rise from floor    Status   Partially Met      PT LONG TERM GOAL #4   Title  Pt will be able to demo strategy for floor to stand transfer in the event of a fall.    Status  Achieved      PT LONG TERM GOAL #5   Title  Pt will demo flexibility throughout bil LEs to [facility] to enable greater range for squatting/stooping for gardening and yard work    Status  Achieved            Plan - 05/27/19 0939    Clinical Impression Statement  Pt reports 75% improvement overall in pain and function.  He has met most LTGs with some end range flexion and ext restrictions in Rt knee remaining.  He is able to perform floor to stand transfer and simulate all desired yard work tasks with resistance, demo'ing improved functional knee ROM.  He is very pleased with his progress and is ready to d/c to HEP.    Examination-Activity Limitations  Transfers;Squat;Stairs;Bend    [employer] Activity;Yard Work    Stability/Clinical Decision Making  Stable/Uncomplicated    Rehab Potential  Excellent    PT Frequency  2x / week    PT Duration  6 weeks    PT Treatment/Interventions  ADLs/Self Care Home Management;Cryotherapy;Electrical Stimulation;Moist Heat;Gait training;Stair training;Functional mobility training;Therapeutic activities;Therapeutic exercise;Balance training;Neuromuscular re-education;Manual techniques;Patient/family education;Passive range of motion;Dry needling;Taping;Spinal Manipulations;Joint Manipulations    PT Next Visit Plan  d/c to HEP    PT Home Exercise Plan  Access Code: [email or web address]       Patient will benefit from skilled therapeutic intervention in order  to improve the following deficits and impairments:     Visit Diagnosis: Stiffness of right knee, not elsewhere classified  Muscle weakness (generalized)  Chronic pain of right knee  Acute pain of right knee  Other abnormalities of gait and mobility     Problem List There are no problems to display for this  patient.  PHYSICAL THERAPY DISCHARGE SUMMARY  Visits from Start of Care: 8  Current functional level related to goals / functional outcomes: See above   Remaining deficits: See above   Education / Equipment: HEP Plan: Patient agrees to discharge.  Patient goals were partially met. Patient is being discharged due to being pleased with the current functional level.  ?????          Baruch Merl, PT 05/27/19 10:01 AM   [facility] [address], [place], [place], [address] Phone: [phone]   Fax:  [phone]  Name: [name] MRN: [id number] Date of Birth: [birth year]

## 2019-07-18 IMAGING — DX DG KNEE 1-2V*R*
1 series · 2 of 2 positions shown · non-contrast
Comparison: 04/23/2017

CLINICAL DATA: Right total knee arthroplasty

EXAM:
RIGHT KNEE - 1-2 VIEW

[Series 1: knee · 0.14mm/px · 2 of 2 slices shown]
[im 1/2]
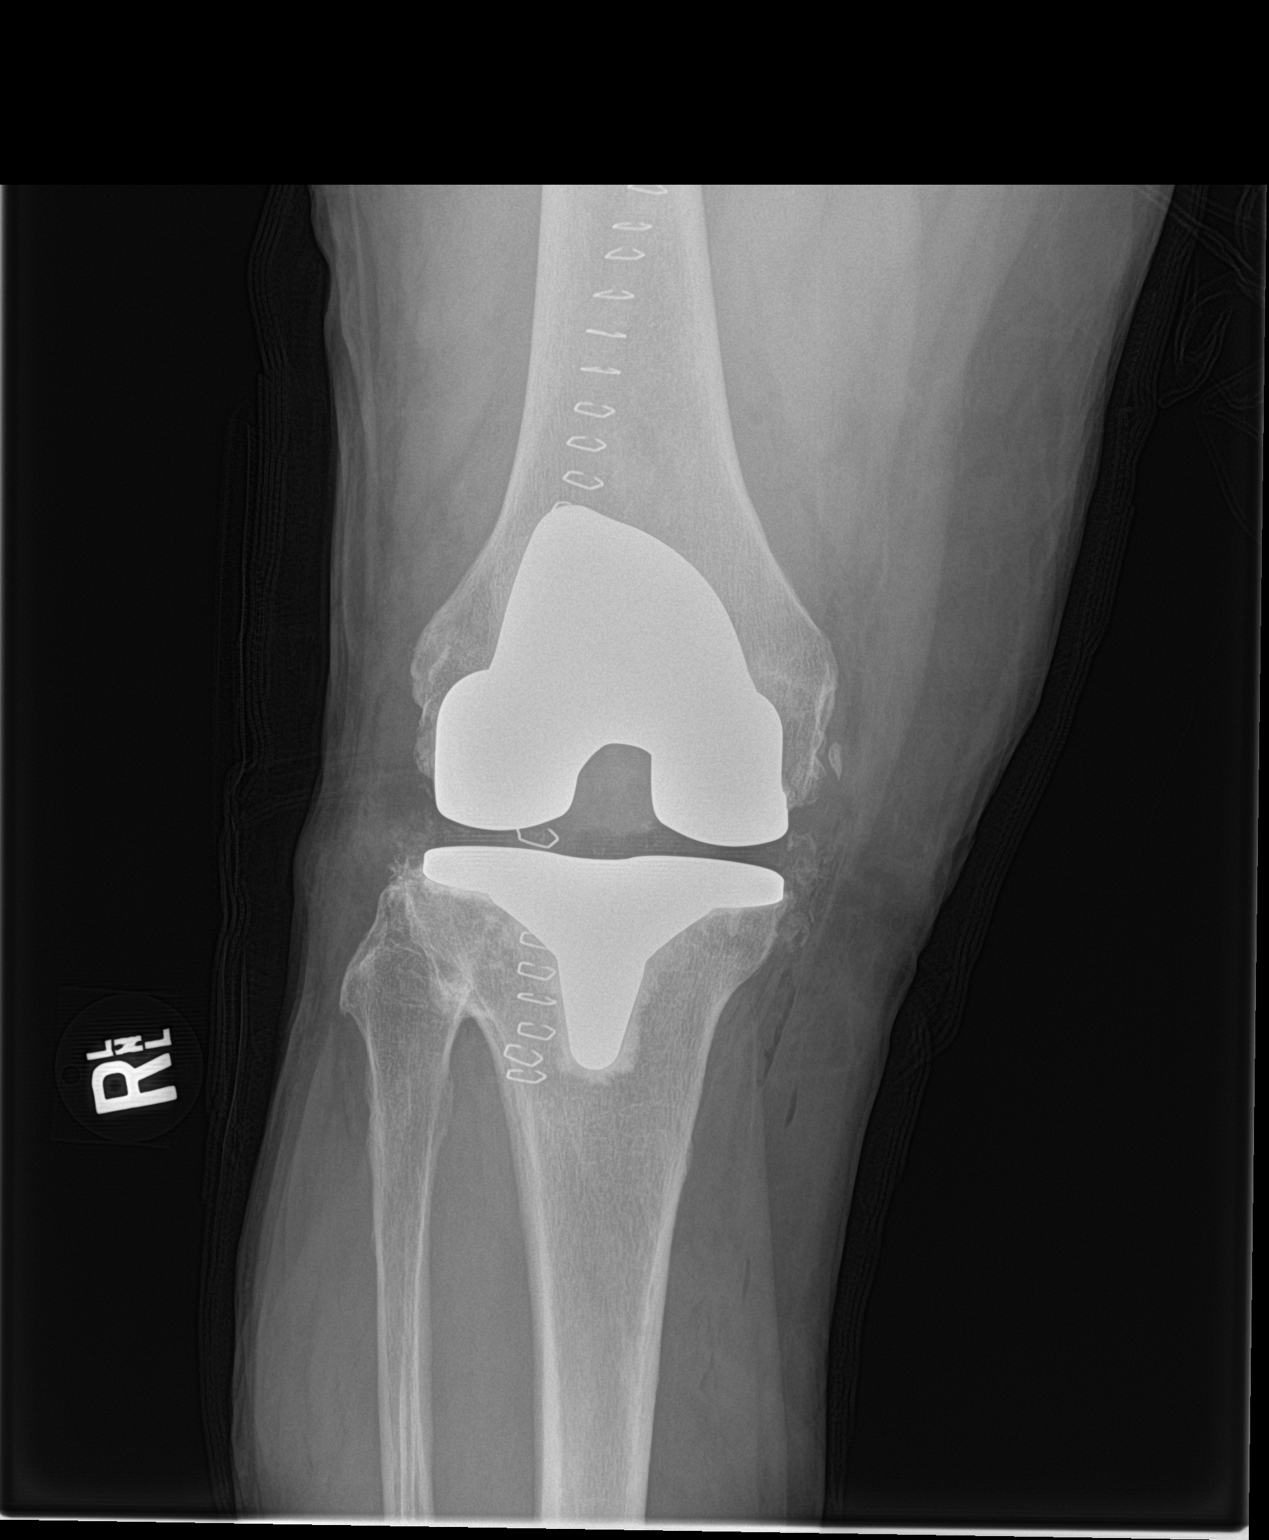
[im 2/2]
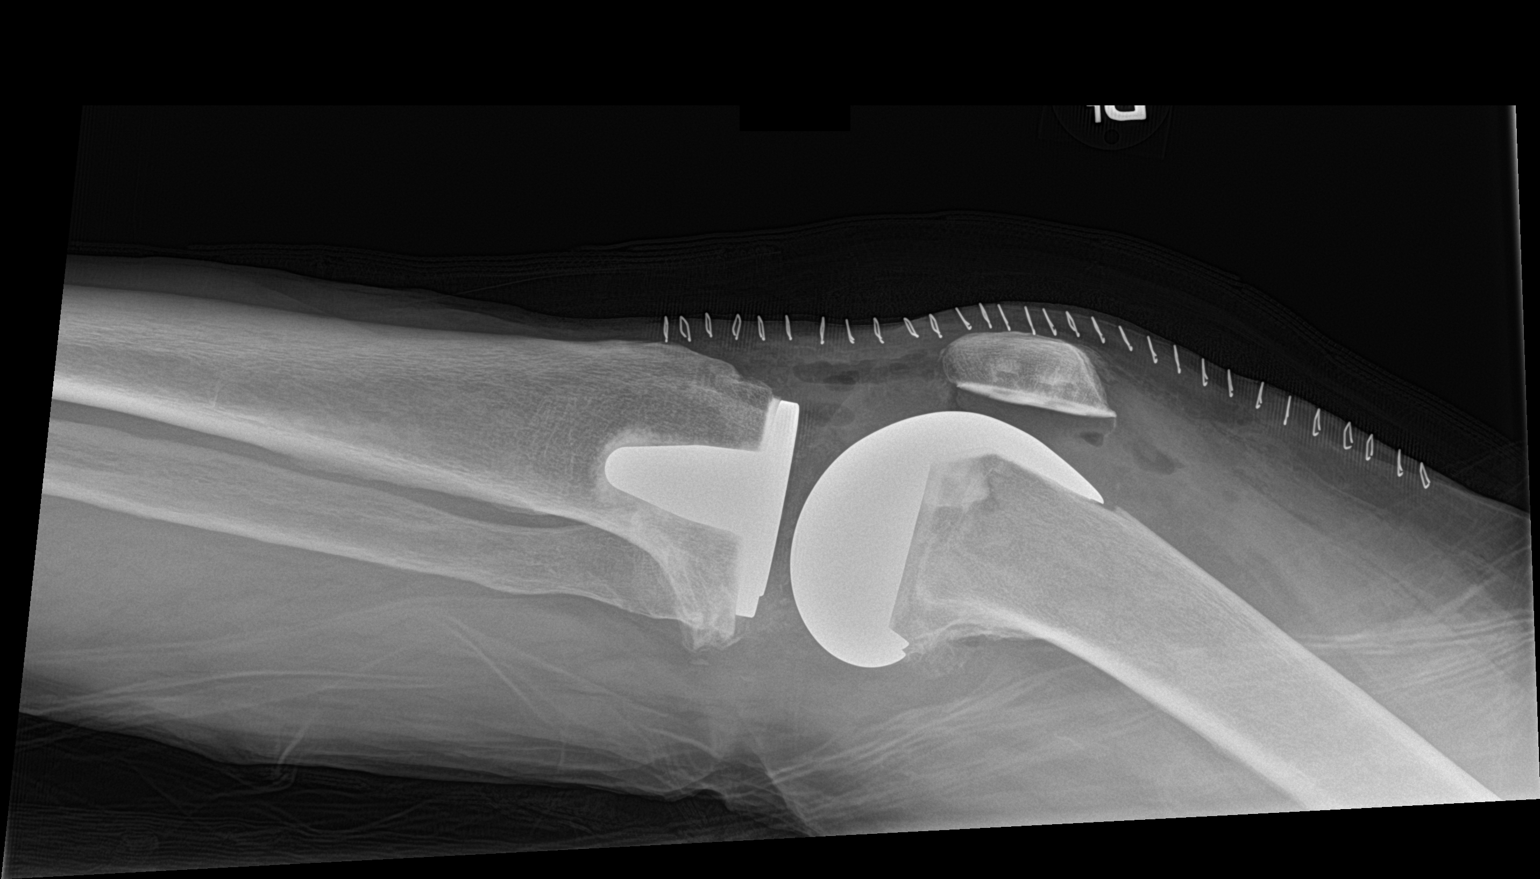

[2 of 2 positions shown; findings below may reference images not displayed]

FINDINGS: Right total knee arthroplasty changes noted. Components appear
aligned. No complicating feature or osseous abnormality. Expected
postop changes of the joint space and soft tissues. Anterior staples
noted.
IMPRESSION: Expected appearance status post right total knee arthroplasty.

## 2020-08-26 ENCOUNTER — Other Ambulatory Visit: Payer: Self-pay

## 2020-08-26 ENCOUNTER — Encounter (HOSPITAL_COMMUNITY)
Admission: EM | Disposition: A | Discharge status: Home / Self Care | Payer: Self-pay | Source: Home / Self Care | Attending: Emergency Medicine

## 2020-08-26 ENCOUNTER — Observation Stay (HOSPITAL_BASED_OUTPATIENT_CLINIC_OR_DEPARTMENT_OTHER)
Admission: EM | Admit: 2020-08-26 | Discharge: 2020-08-27 | Disposition: A | Discharge status: Home / Self Care | Payer: Medicare HMO | Attending: Internal Medicine | Admitting: Internal Medicine

## 2020-08-26 ENCOUNTER — Inpatient Hospital Stay (HOSPITAL_COMMUNITY): Payer: Medicare HMO

## 2020-08-26 ENCOUNTER — Inpatient Hospital Stay (HOSPITAL_COMMUNITY): Payer: Medicare HMO | Admitting: Certified Registered Nurse Anesthetist

## 2020-08-26 ENCOUNTER — Emergency Department (HOSPITAL_BASED_OUTPATIENT_CLINIC_OR_DEPARTMENT_OTHER): Payer: Medicare HMO

## 2020-08-26 ENCOUNTER — Encounter (HOSPITAL_BASED_OUTPATIENT_CLINIC_OR_DEPARTMENT_OTHER): Payer: Self-pay | Admitting: Emergency Medicine

## 2020-08-26 DIAGNOSIS — R109 Unspecified abdominal pain: Secondary | ICD-10-CM | POA: Diagnosis present

## 2020-08-26 DIAGNOSIS — N200 Calculus of kidney: Secondary | ICD-10-CM

## 2020-08-26 DIAGNOSIS — R739 Hyperglycemia, unspecified: Secondary | ICD-10-CM | POA: Diagnosis present

## 2020-08-26 DIAGNOSIS — Z96651 Presence of right artificial knee joint: Secondary | ICD-10-CM | POA: Insufficient documentation

## 2020-08-26 DIAGNOSIS — Z79899 Other long term (current) drug therapy: Secondary | ICD-10-CM | POA: Diagnosis not present

## 2020-08-26 DIAGNOSIS — D72829 Elevated white blood cell count, unspecified: Secondary | ICD-10-CM | POA: Insufficient documentation

## 2020-08-26 DIAGNOSIS — K219 Gastro-esophageal reflux disease without esophagitis: Secondary | ICD-10-CM | POA: Diagnosis present

## 2020-08-26 DIAGNOSIS — Z87891 Personal history of nicotine dependence: Secondary | ICD-10-CM | POA: Insufficient documentation

## 2020-08-26 DIAGNOSIS — N132 Hydronephrosis with renal and ureteral calculous obstruction: Secondary | ICD-10-CM | POA: Diagnosis not present

## 2020-08-26 DIAGNOSIS — N12 Tubulo-interstitial nephritis, not specified as acute or chronic: Secondary | ICD-10-CM

## 2020-08-26 DIAGNOSIS — Z20822 Contact with and (suspected) exposure to covid-19: Secondary | ICD-10-CM | POA: Insufficient documentation

## 2020-08-26 DIAGNOSIS — I10 Essential (primary) hypertension: Secondary | ICD-10-CM | POA: Diagnosis present

## 2020-08-26 DIAGNOSIS — E871 Hypo-osmolality and hyponatremia: Secondary | ICD-10-CM | POA: Diagnosis not present

## 2020-08-26 DIAGNOSIS — N10 Acute tubulo-interstitial nephritis: Secondary | ICD-10-CM

## 2020-08-26 DIAGNOSIS — E118 Type 2 diabetes mellitus with unspecified complications: Secondary | ICD-10-CM | POA: Diagnosis present

## 2020-08-26 DIAGNOSIS — Z7982 Long term (current) use of aspirin: Secondary | ICD-10-CM | POA: Diagnosis not present

## 2020-08-26 HISTORY — PX: CYSTOSCOPY WITH STENT PLACEMENT: SHX5790

## 2020-08-26 LAB — CBC WITH DIFFERENTIAL/PLATELET
Abs Immature Granulocytes: 0.05 10*3/uL (ref 0.00–0.07)
Basophils Absolute: 0.1 10*3/uL (ref 0.0–0.1)
Basophils Relative: 1 %
Eosinophils Absolute: 0.5 10*3/uL (ref 0.0–0.5)
Eosinophils Relative: 5 %
HCT: 43 % (ref 39.0–52.0)
Hemoglobin: 14.8 g/dL (ref 13.0–17.0)
Immature Granulocytes: 1 %
Lymphocytes Relative: 20 %
Lymphs Abs: 2.2 10*3/uL (ref 0.7–4.0)
MCH: 31.2 pg (ref 26.0–34.0)
MCHC: 34.4 g/dL (ref 30.0–36.0)
MCV: 90.7 fL (ref 80.0–100.0)
Monocytes Absolute: 0.8 10*3/uL (ref 0.1–1.0)
Monocytes Relative: 7 %
Neutro Abs: 7.3 10*3/uL (ref 1.7–7.7)
Neutrophils Relative %: 66 %
Platelets: 261 10*3/uL (ref 150–400)
RBC: 4.74 MIL/uL (ref 4.22–5.81)
RDW: 13.2 % (ref 11.5–15.5)
WBC: 11 10*3/uL — ABNORMAL HIGH (ref 4.0–10.5)
nRBC: 0 % (ref 0.0–0.2)

## 2020-08-26 LAB — BASIC METABOLIC PANEL
Anion gap: 11 (ref 5–15)
BUN: 12 mg/dL (ref 8–23)
CO2: 22 mmol/L (ref 22–32)
Calcium: 9.1 mg/dL (ref 8.9–10.3)
Chloride: 100 mmol/L (ref 98–111)
Creatinine, Ser: 0.96 mg/dL (ref 0.61–1.24)
GFR, Estimated: >60 mL/min (ref >60)
Glucose, Bld: 159 mg/dL — ABNORMAL HIGH (ref 70–99)
Potassium: 3.7 mmol/L (ref 3.5–5.1)
Sodium: 133 mmol/L — ABNORMAL LOW (ref 135–145)

## 2020-08-26 LAB — PROTIME-INR
INR: 1 (ref 0.8–1.2)
Prothrombin Time: 13.2 seconds (ref 11.4–15.2)

## 2020-08-26 LAB — URINALYSIS, ROUTINE W REFLEX MICROSCOPIC
Bilirubin Urine: NEGATIVE
Glucose, UA: NEGATIVE mg/dL
Ketones, ur: NEGATIVE mg/dL
Nitrite: NEGATIVE
Protein, ur: 30 mg/dL — AB
RBC / HPF: >50 RBC/hpf — ABNORMAL HIGH (ref 0–5)
Specific Gravity, Urine: 1.014 (ref 1.005–1.030)
WBC, UA: >50 WBC/hpf — ABNORMAL HIGH (ref 0–5)
pH: 5.5 (ref 5.0–8.0)

## 2020-08-26 LAB — RESP PANEL BY RT-PCR (FLU A&B, COVID) ARPGX2
Influenza A by PCR: NEGATIVE
Influenza B by PCR: NEGATIVE
SARS Coronavirus 2 by RT PCR: NEGATIVE

## 2020-08-26 LAB — HEMOGLOBIN A1C
Hgb A1c MFr Bld: 6.7 % — ABNORMAL HIGH (ref 4.8–5.6)
Mean Plasma Glucose: 145.59 mg/dL

## 2020-08-26 LAB — APTT: aPTT: 27 seconds (ref 24–36)

## 2020-08-26 LAB — HIV ANTIBODY (ROUTINE TESTING W REFLEX): HIV Screen 4th Generation wRfx: NONREACTIVE

## 2020-08-26 SURGERY — CYSTOSCOPY, WITH STENT INSERTION
Anesthesia: General | Site: Ureter | Laterality: Right

## 2020-08-26 MED ORDER — SODIUM CHLORIDE 0.9 % IV SOLN
INTRAVENOUS | Status: AC
  Filled 2020-08-26: qty 20

## 2020-08-26 MED ORDER — AMISULPRIDE (ANTIEMETIC) 5 MG/2ML IV SOLN: 10.0000 mg | Freq: Once | INTRAVENOUS | Status: DC | PRN

## 2020-08-26 MED ORDER — FENTANYL CITRATE (PF) 100 MCG/2ML IJ SOLN
50.0000 ug | Freq: Once | INTRAMUSCULAR | Status: AC
  Administered 2020-08-26: 50 ug via INTRAVENOUS
  Filled 2020-08-26: qty 2

## 2020-08-26 MED ORDER — FENTANYL CITRATE (PF) 100 MCG/2ML IJ SOLN
INTRAMUSCULAR | Status: AC
  Filled 2020-08-26: qty 2

## 2020-08-26 MED ORDER — ONDANSETRON HCL 4 MG/2ML IJ SOLN
INTRAMUSCULAR | Status: DC | PRN
  Administered 2020-08-26: 4 mg via INTRAVENOUS

## 2020-08-26 MED ORDER — SODIUM CHLORIDE 0.9 % IV SOLN
1.0000 g | INTRAVENOUS | Status: DC
  Administered 2020-08-27: 1 g via INTRAVENOUS
  Filled 2020-08-26: qty 1

## 2020-08-26 MED ORDER — ACETAMINOPHEN 325 MG PO TABS: 650.0000 mg | ORAL_TABLET | Freq: Four times a day (QID) | ORAL | Status: DC | PRN

## 2020-08-26 MED ORDER — ONDANSETRON HCL 4 MG/2ML IJ SOLN: 4.0000 mg | Freq: Four times a day (QID) | INTRAMUSCULAR | Status: DC | PRN

## 2020-08-26 MED ORDER — PROPOFOL 10 MG/ML IV BOLUS
INTRAVENOUS | Status: DC | PRN
  Administered 2020-08-26: 40 mg via INTRAVENOUS
  Administered 2020-08-26: 160 mg via INTRAVENOUS

## 2020-08-26 MED ORDER — KETOROLAC TROMETHAMINE 30 MG/ML IJ SOLN
30.0000 mg | Freq: Four times a day (QID) | INTRAMUSCULAR | Status: DC | PRN
  Administered 2020-08-26 – 2020-08-27 (×2): 30 mg via INTRAVENOUS
  Filled 2020-08-26 (×2): qty 1

## 2020-08-26 MED ORDER — IPRATROPIUM-ALBUTEROL 0.5-2.5 (3) MG/3ML IN SOLN
3.0000 mL | RESPIRATORY_TRACT | Status: DC
  Administered 2020-08-26: 3 mL via RESPIRATORY_TRACT
  Filled 2020-08-26: qty 3

## 2020-08-26 MED ORDER — MORPHINE SULFATE (PF) 4 MG/ML IV SOLN: 4.0000 mg | INTRAVENOUS | Status: DC | PRN

## 2020-08-26 MED ORDER — DEXAMETHASONE SODIUM PHOSPHATE 10 MG/ML IJ SOLN
INTRAMUSCULAR | Status: DC | PRN
  Administered 2020-08-26: 5 mg via INTRAVENOUS

## 2020-08-26 MED ORDER — FENTANYL CITRATE (PF) 100 MCG/2ML IJ SOLN: 25.0000 ug | INTRAMUSCULAR | Status: DC | PRN

## 2020-08-26 MED ORDER — SODIUM CHLORIDE 0.9 % IV SOLN: INTRAVENOUS | Status: DC

## 2020-08-26 MED ORDER — ACETAMINOPHEN 10 MG/ML IV SOLN: 1000.0000 mg | Freq: Once | INTRAVENOUS | Status: DC | PRN

## 2020-08-26 MED ORDER — CHLORHEXIDINE GLUCONATE 0.12 % MT SOLN
15.0000 mL | Freq: Once | OROMUCOSAL | Status: AC
  Administered 2020-08-26: 15 mL via OROMUCOSAL

## 2020-08-26 MED ORDER — DICLOFENAC SODIUM 75 MG PO TBEC
75.0000 mg | DELAYED_RELEASE_TABLET | Freq: Two times a day (BID) | ORAL | Status: DC
  Administered 2020-08-26: 75 mg via ORAL
  Filled 2020-08-26: qty 1

## 2020-08-26 MED ORDER — ACETAMINOPHEN 650 MG RE SUPP: 650.0000 mg | Freq: Four times a day (QID) | RECTAL | Status: DC | PRN

## 2020-08-26 MED ORDER — LACTATED RINGERS IV SOLN: INTRAVENOUS | Status: DC

## 2020-08-26 MED ORDER — IOHEXOL 300 MG/ML  SOLN
INTRAMUSCULAR | Status: DC | PRN
  Administered 2020-08-26: 10 mL

## 2020-08-26 MED ORDER — MORPHINE SULFATE (PF) 2 MG/ML IV SOLN: 2.0000 mg | INTRAVENOUS | Status: DC | PRN

## 2020-08-26 MED ORDER — FENTANYL CITRATE (PF) 100 MCG/2ML IJ SOLN
INTRAMUSCULAR | Status: DC | PRN
  Administered 2020-08-26 (×2): 50 ug via INTRAVENOUS

## 2020-08-26 MED ORDER — PROPOFOL 10 MG/ML IV BOLUS
INTRAVENOUS | Status: AC
  Filled 2020-08-26: qty 20

## 2020-08-26 MED ORDER — ONDANSETRON HCL 4 MG PO TABS: 4.0000 mg | ORAL_TABLET | Freq: Four times a day (QID) | ORAL | Status: DC | PRN

## 2020-08-26 MED ORDER — HYDRALAZINE HCL 20 MG/ML IJ SOLN: 10.0000 mg | Freq: Four times a day (QID) | INTRAMUSCULAR | Status: DC | PRN

## 2020-08-26 MED ORDER — METOPROLOL SUCCINATE ER 50 MG PO TB24
50.0000 mg | ORAL_TABLET | Freq: Every day | ORAL | Status: DC
  Administered 2020-08-26 – 2020-08-27 (×2): 50 mg via ORAL
  Filled 2020-08-26 (×2): qty 1

## 2020-08-26 MED ORDER — POLYETHYLENE GLYCOL 3350 17 G PO PACK: 17.0000 g | PACK | Freq: Every day | ORAL | Status: DC | PRN

## 2020-08-26 MED ORDER — DEXTROSE 5 % IV SOLN
INTRAVENOUS | Status: DC | PRN
  Administered 2020-08-26: 1 g via INTRAVENOUS

## 2020-08-26 MED ORDER — SODIUM CHLORIDE 0.9 % IV SOLN
1.0000 g | Freq: Once | INTRAVENOUS | Status: AC
  Administered 2020-08-26: 1 g via INTRAVENOUS
  Filled 2020-08-26: qty 10

## 2020-08-26 MED ORDER — MIDAZOLAM HCL 2 MG/2ML IJ SOLN
INTRAMUSCULAR | Status: AC
  Filled 2020-08-26: qty 2

## 2020-08-26 MED ORDER — KETOROLAC TROMETHAMINE 30 MG/ML IJ SOLN
15.0000 mg | Freq: Once | INTRAMUSCULAR | Status: AC
  Administered 2020-08-26: 15 mg via INTRAVENOUS
  Filled 2020-08-26: qty 1

## 2020-08-26 MED ORDER — LIDOCAINE 2% (20 MG/ML) 5 ML SYRINGE
INTRAMUSCULAR | Status: DC | PRN
  Administered 2020-08-26: 100 mg via INTRAVENOUS

## 2020-08-26 MED ORDER — ONDANSETRON HCL 4 MG/2ML IJ SOLN: 4.0000 mg | Freq: Once | INTRAMUSCULAR | Status: DC | PRN

## 2020-08-26 MED ORDER — MIDAZOLAM HCL 5 MG/5ML IJ SOLN
INTRAMUSCULAR | Status: DC | PRN
  Administered 2020-08-26: 2 mg via INTRAVENOUS

## 2020-08-26 MED ORDER — MELATONIN 5 MG PO TABS: 10.0000 mg | ORAL_TABLET | Freq: Every evening | ORAL | Status: DC | PRN

## 2020-08-26 MED ORDER — ALUM & MAG HYDROXIDE-SIMETH 200-200-20 MG/5ML PO SUSP
30.0000 mL | Freq: Once | ORAL | Status: AC
  Administered 2020-08-26: 30 mL via ORAL
  Filled 2020-08-26: qty 30

## 2020-08-26 MED ORDER — ONDANSETRON HCL 4 MG/2ML IJ SOLN
4.0000 mg | Freq: Once | INTRAMUSCULAR | Status: AC
  Administered 2020-08-26: 4 mg via INTRAVENOUS
  Filled 2020-08-26: qty 2

## 2020-08-26 SURGICAL SUPPLY — 15 items
BAG URO CATCHER STRL LF (MISCELLANEOUS) ×2 IMPLANT
BASKET ZERO TIP NITINOL 2.4FR (BASKET) IMPLANT
BSKT STON RTRVL ZERO TP 2.4FR (BASKET)
CATH INTERMIT  6FR 70CM (CATHETERS) ×1 IMPLANT
CLOTH BEACON ORANGE TIMEOUT ST (SAFETY) ×2 IMPLANT
GLOVE SURG ENC TEXT LTX SZ7.5 (GLOVE) ×2 IMPLANT
GOWN STRL REUS W/TWL XL LVL3 (GOWN DISPOSABLE) ×2 IMPLANT
GUIDEWIRE ANG ZIPWIRE 038X150 (WIRE) IMPLANT
GUIDEWIRE STR DUAL SENSOR (WIRE) ×2 IMPLANT
KIT TURNOVER KIT A (KITS) ×2 IMPLANT
MANIFOLD NEPTUNE II (INSTRUMENTS) ×2 IMPLANT
PACK CYSTO (CUSTOM PROCEDURE TRAY) ×2 IMPLANT
STENT URET 6FRX26 CONTOUR (STENTS) ×1 IMPLANT
TRAY FOLEY MTR SLVR 14FR STAT (SET/KITS/TRAYS/PACK) ×1 IMPLANT
TUBING CONNECTING 10 (TUBING) ×2 IMPLANT

## 2020-08-26 NOTE — ED Provider Notes (Addendum)
[facility] EMERGENCY DEPT Provider Note   CSN: [id number] Arrival date & time: 08/26/20  0030     History Chief Complaint  Patient presents with  . Back Pain  . Abdominal Pain    [name] is a 65 y.o. male.  The history is provided by the patient.  Abdominal Pain Pain location:  Generalized Pain quality: cramping   Pain radiation: left flank  Pain severity:  Moderate Onset quality:  Sudden Duration:  2 hours Timing:  Constant Progression:  Unchanged Chronicity:  New Context: not awakening from sleep and not sick contacts   Relieved by:  Nothing Worsened by:  Nothing Ineffective treatments:  None tried Associated symptoms: no anorexia, no chest pain, no chills, no constipation, no cough, no diarrhea, no dysuria, no fatigue, no flatus, no hematuria, no nausea, no shortness of breath and no vomiting   Patient with a h/o HTN and kidney stone presents with chief complaint of diffuse crampy abdominal pain that radiates toward the left flank.  No n/v/d.  No f/c/r.  No constipation.      Past Medical History:  Diagnosis Date  . Acid reflux   . Arthritis   . Depression   . Glaucoma   . Hypertension     There are no problems to display for this patient.   Past Surgical History:  Procedure Laterality Date  . ANKLE FRACTURE SURGERY     states broken leg not ankle  . COLONOSCOPY    . KNEE SURGERY    . TOTAL KNEE ARTHROPLASTY Right 07/12/2017   Procedure: RIGHT TOTAL KNEE ARTHROPLASTY -CEMENTED;  Surgeon: Marybelle Killings, MD;  Location: [facility];  Service: Orthopedics;  Laterality: Right;       Family History  Problem Relation Age of Onset  . Bladder Cancer Mother   . Coronary artery disease Mother   . Heart disease Mother   . Stroke Father   . Alcoholism Father     Social History   Tobacco Use  . Smoking status: Former Smoker    Quit date: 12/19/1999    Years since quitting: 20.7  . Smokeless tobacco: Never Used  Vaping Use  . Vaping Use:  Never used  Substance Use Topics  . Alcohol use: Yes    Comment: 2 beers every other day  . Drug use: No    Home Medications Prior to Admission medications   Medication Sig Start Date End Date Taking? Authorizing Provider  aspirin EC 325 MG tablet Take 1 tablet (325 mg total) by mouth 2 (two) times daily. 07/14/17   Leandrew Koyanagi, MD  diclofenac (VOLTAREN) 75 MG EC tablet Take 75 mg by mouth 2 (two) times daily. 03/02/17   [provider]  loratadine (CLARITIN) 10 MG tablet Take 10 mg by mouth daily.    [provider]  metoprolol succinate (TOPROL-XL) 50 MG 24 hr tablet TAKE 1 TABLET BY MOUTH EVERY DAY 08/21/16   [provider]  Multiple Vitamins-Minerals (MULTIVITAMIN WITH MINERALS) tablet Take 1 tablet by mouth daily.    [provider]  Omega-3 Fatty Acids (FISH OIL) 1000 MG CAPS Take 1 capsule by mouth daily.    [provider]  omeprazole (PRILOSEC) 20 MG capsule Take 20 mg by mouth daily as needed.    [provider]  oxyCODONE-acetaminophen (PERCOCET) 5-325 MG tablet Take 1-2 tablets by mouth every 4 (four) hours as needed for severe pain. Patient not taking: Reported on 08/27/2017 07/14/17   Frankey Shown  M, MD  senna-docusate (SENOKOT S) 8.6-50 MG tablet Take 1 tablet by mouth at bedtime as needed. 07/14/17   Leandrew Koyanagi, MD  timolol (TIMOPTIC) 0.5 % ophthalmic solution  04/21/18   [provider]  traMADol (ULTRAM) 50 MG tablet Take 1 tablet (50 mg total) by mouth 2 (two) times daily as needed for moderate pain. Patient not taking: Reported on 08/27/2017 08/14/17   Marybelle Killings, MD  travoprost, benzalkonium, (TRAVATAN) 0.004 % ophthalmic solution Place 1 drop into both eyes at bedtime.    [provider]    Allergies    Patient has no known allergies.  Review of Systems   Review of Systems  Constitutional: Negative for chills and fatigue.  HENT: Negative for congestion.   Eyes: Negative for visual disturbance.   Respiratory: Negative for cough and shortness of breath.   Cardiovascular: Negative for chest pain.  Gastrointestinal: Positive for abdominal pain. Negative for anorexia, constipation, diarrhea, flatus, nausea and vomiting.  Genitourinary: Positive for flank pain. Negative for dysuria and hematuria.  Musculoskeletal: Negative for arthralgias.  Skin: Negative for rash.  Neurological: Negative for dizziness.  Psychiatric/Behavioral: Negative for agitation.  All other systems reviewed and are negative.   Physical Exam Updated Vital Signs BP (!) 165/83 (BP Location: Right Arm)   Pulse 92   Temp 97.7 F (36.5 C) (Oral)   Resp 16   Ht 5\' 5"  (1.651 m)   Wt 99.8 kg   SpO2 100%   BMI 36.61 kg/m   Physical Exam Vitals and nursing note reviewed.  Constitutional:      General: He is not in acute distress.    Appearance: Normal appearance.  HENT:     Head: Normocephalic and atraumatic.     Nose: Nose normal.  Eyes:     Conjunctiva/sclera: Conjunctivae normal.     Pupils: Pupils are equal, round, and reactive to light.  Cardiovascular:     Rate and Rhythm: Normal rate and regular rhythm.     Pulses: Normal pulses.     Heart sounds: Normal heart sounds.  Pulmonary:     Effort: Pulmonary effort is normal.     Breath sounds: Normal breath sounds.  Abdominal:     General: Abdomen is flat. Bowel sounds are normal.     Palpations: Abdomen is soft.     Tenderness: There is no abdominal tenderness. There is no guarding or rebound.  Musculoskeletal:        General: Normal range of motion.     Cervical back: Normal range of motion and neck supple.  Skin:    General: Skin is warm and dry.  Neurological:     General: No focal deficit present.     Mental Status: He is alert and oriented to person, place, and time.     Deep Tendon Reflexes: Reflexes normal.  Psychiatric:        Mood and Affect: Mood normal.        Behavior: Behavior normal.     ED Results / Procedures / Treatments    Labs (all labs ordered are listed, but only abnormal results are displayed) Results for orders placed or performed during the hospital encounter of 08/26/20  Urinalysis, Routine w reflex microscopic Urine, Clean Catch  Result Value Ref Range   Color, Urine YELLOW YELLOW   APPearance HAZY (A) CLEAR   Specific Gravity, Urine 1.014 1.005 - 1.030   pH 5.5 5.0 - 8.0   Glucose, UA NEGATIVE NEGATIVE mg/dL  Hgb urine dipstick MODERATE (A) NEGATIVE   Bilirubin Urine NEGATIVE NEGATIVE   Ketones, ur NEGATIVE NEGATIVE mg/dL   Protein, ur 30 (A) NEGATIVE mg/dL   Nitrite NEGATIVE NEGATIVE   Leukocytes,Ua LARGE (A) NEGATIVE   RBC / HPF >50 (H) 0 - 5 RBC/hpf   WBC, UA >50 (H) 0 - 5 WBC/hpf   Bacteria, UA MANY (A) NONE SEEN   Squamous Epithelial / LPF 0-5 0 - 5   WBC Clumps PRESENT   CBC with Differential/Platelet  Result Value Ref Range   WBC 11.0 (H) 4.0 - 10.5 K/uL   RBC 4.74 4.22 - 5.81 MIL/uL   Hemoglobin 14.8 13.0 - 17.0 g/dL   HCT 43.0 39.0 - 52.0 %   MCV 90.7 80.0 - 100.0 fL   MCH 31.2 26.0 - 34.0 pg   MCHC 34.4 30.0 - 36.0 g/dL   RDW 13.2 11.5 - 15.5 %   Platelets 261 150 - 400 K/uL   nRBC 0.0 0.0 - 0.2 %   Neutrophils Relative % 66 %   Neutro Abs 7.3 1.7 - 7.7 K/uL   Lymphocytes Relative 20 %   Lymphs Abs 2.2 0.7 - 4.0 K/uL   Monocytes Relative 7 %   Monocytes Absolute 0.8 0.1 - 1.0 K/uL   Eosinophils Relative 5 %   Eosinophils Absolute 0.5 0.0 - 0.5 K/uL   Basophils Relative 1 %   Basophils Absolute 0.1 0.0 - 0.1 K/uL   Immature Granulocytes 1 %   Abs Immature Granulocytes 0.05 0.00 - 0.07 K/uL   CT Renal Stone Study  Result Date: 08/26/2020 CLINICAL DATA:  Flank pain EXAM: CT ABDOMEN AND PELVIS WITHOUT CONTRAST TECHNIQUE: Multidetector CT imaging of the abdomen and pelvis was performed following the standard protocol without IV contrast. COMPARISON:  None. FINDINGS: LOWER CHEST: Normal. HEPATOBILIARY: Normal hepatic contours. No intra- or extrahepatic biliary dilatation.  The gallbladder is normal. PANCREAS: Normal pancreas. No ductal dilatation or peripancreatic fluid collection. SPLEEN: Normal. ADRENALS/URINARY TRACT: The adrenal glands are normal. Right kidney is edematous with pelviectasis and moderate perinephric stranding. There is a stone at the right ureteropelvic junction measuring 5 mm. There is a nonobstructing interpolar stone that measures 9 mm. Left kidney is normal. The urinary bladder is normal for degree of distention STOMACH/BOWEL: There is no hiatal hernia. Normal duodenal course and caliber. No small bowel dilatation or inflammation. No focal colonic abnormality. The appendix is not visualized. No right lower quadrant inflammation or free fluid. VASCULAR/LYMPHATIC: There is calcific atherosclerosis of the abdominal aorta. No lymphadenopathy. REPRODUCTIVE: There are calcifications within the normal-sized prostate. Symmetric seminal vesicles. MUSCULOSKELETAL. No bony spinal canal stenosis or focal osseous abnormality. OTHER: None. IMPRESSION: 1. Right-sided obstructive uropathy with 5 mm stone at the right ureteropelvic junction causing pelviectasis and moderate perinephric stranding. 2. Nonobstructing 9 mm interpolar stone. 3.  Aortic atherosclerosis (ICD10-I70.0). Electronically Signed   By: Ulyses Jarred M.D.   On: 08/26/2020 01:27    Procedures Procedures   Medications Ordered in ED Medications  cefTRIAXone (ROCEPHIN) 1 g in sodium chloride 0.9 % 100 mL IVPB (1 g Intravenous New Bag/Given 08/26/20 0146)  0.9 %  sodium chloride infusion ( Intravenous New Bag/Given 08/26/20 0146)  fentaNYL (SUBLIMAZE) injection 50 mcg (has no administration in time range)  ketorolac (TORADOL) 30 MG/ML injection 15 mg (15 mg Intravenous Given 08/26/20 0052)  alum & mag hydroxide-simeth (MAALOX/MYLANTA) 200-200-20 MG/5ML suspension 30 mL (30 mLs Oral Given 08/26/20 0048)  ondansetron (ZOFRAN) injection 4 mg (4 mg  Intravenous Given 08/26/20 0147)    ED Course  I have  reviewed the triage vital signs and the nursing notes.  Pertinent labs & imaging results that were available during my care of the patient were reviewed by me and considered in my medical decision making (see chart for details).    303 am case d/w Dr. Junious Silk who will see the patient in consult Final Clinical Impression(s) / ED Diagnoses    216 Case d/w Dr. Tonie Griffith who will admit the patient for pyelonephritis.          Christal Lagerstrom, MD 08/26/20 9326

## 2020-08-26 NOTE — Op Note (Signed)
Preoperative diagnosis: Right ureteral and renal stones Postoperative diagnosis: Same  Procedure: Cystoscopy with right retrograde pyelogram, right ureteral stent placement, Foley catheter placement  Surgeon: Junious Silk  Anesthesia: General  Indication procedure: [name] is a 65 year old male with refractory pain to a right proximal ureteral stone, bacteriuria and concern for development of septic stone.  He was brought in for ureteral stent in preparation of a staged ureteroscopy.  Findings: On exam under anesthesia the penis was circumcised without mass or lesion, testicles descended bilaterally and palpably normal.  On DRE the prostate was small about 20 g and smooth without hard area or nodule.  On cystoscopy the urethra was normal, the prostatic urethra was short and nonobstructive although the bladder neck was high and the bladder neck was stiff.  Similar to patient after radiation.  No mucosal lesions were noted but the scope was not clear and the urine was cloudy.  Right retrograde pyelogram-this outlined a single ureter single collecting system unit with a filling defect in the right proximal ureter and moderate hydro nephrosis and dilation of the collecting system.  Other stone was noted in the right lower pole.  Description of procedure: After consent was obtained patient brought to the operating room.  He was placed in lithotomy position and prepped and draped in the usual sterile fashion.  A timeout was performed confirm the patient and procedure.  Cystoscope was passed per urethra and the right ureteral orifice was cannulated with 5 [place] open-ended catheter.  Retrograde injection of contrast was performed.  A sensor wire was advanced and coiled in the collecting system.  A 626 stent was advanced.  We were wearing the new textured gloves but the pusher was dropped.  I tried to use the 5 [place] open-ended catheter but it went into the lumen of the stent and then the stent was pulled  out therefore a new 5 [place] open-ended catheter was used to recannulate the right ureteral orifice and new contrast pushed to again confirm the ureteral lumen and the sensor wire readvanced to the collecting system.  The 626 was then passed again and the regular pusher was used to advance the stent.  Wire was removed with a good coil seen in the collecting system and a good coil in the bladder.  The scope was backed out and I placed a 14 French Foley catheter for max drainage overnight.  Given his chronic bacteriuria and urinary complaints I did an exam under anesthesia. He was awakened taken to cover him in stable condition.  Complications: None  Blood loss: Minimal  Specimens: None  Drains: 6 x 26 cm right ureteral stent, 14 French Foley catheter  Disposition: Patient stable to PACU - I called and updated Sarah on the procedure, post-op course and next step - URS/HLL/stent exchange.

## 2020-08-26 NOTE — Consult Note (Signed)
Consultation: Right proximal ureteral stone, bacteriuria Requested by: Dr. April Palumbo  History of Present Illness: [name] is a 65 year old male who developed right flank pain.  CT scan of the abdomen and pelvis revealed a 5 mm right proximal stone with hydronephrosis and a 9 mm right lower pole stone.  His pain was poorly controlled and he also complained of foul-smelling urine and dysuria (which is chronic). A UA showed many bacteria greater than 50 white cells greater than 50 red cells.  He was admitted for pain control and bacteriuria.  White count was 11 and creatinine normal.  He has a remote history of stones.  Pain a little better this morning.  No stone passage.  Temp 99 and heart rate 110.  He complains of a long history of frequency and nocturia.  He has chronic urine odor and occasional dysuria.  Past Medical History:  Diagnosis Date  . Acid reflux   . Arthritis   . Depression   . Glaucoma   . Hypertension    Past Surgical History:  Procedure Laterality Date  . ANKLE FRACTURE SURGERY     states broken leg not ankle  . COLONOSCOPY    . KNEE SURGERY    . TOTAL KNEE ARTHROPLASTY Right 07/12/2017   Procedure: RIGHT TOTAL KNEE ARTHROPLASTY -CEMENTED;  Surgeon: Eldred Manges, MD;  Location: [facility] OR;  Service: Orthopedics;  Laterality: Right;    Home Medications:  Medications Prior to Admission  Medication Sig Dispense Refill Last Dose  . aspirin EC 325 MG tablet Take 1 tablet (325 mg total) by mouth 2 (two) times daily. 28 tablet 0 unk at unk  . loratadine (CLARITIN) 10 MG tablet Take 10 mg by mouth daily as needed for allergies.   unk at unk  . metoprolol succinate (TOPROL-XL) 50 MG 24 hr tablet TAKE 1 TABLET BY MOUTH EVERY DAY   08/25/2020 at am  . omeprazole (PRILOSEC) 20 MG capsule Take 20 mg by mouth daily as needed.   unk at unk  . timolol (TIMOPTIC) 0.5 % ophthalmic solution Place 1 drop into both eyes daily.   Past Month at Unknown time  . travoprost, benzalkonium,  (TRAVATAN) 0.004 % ophthalmic solution Place 1 drop into both eyes at bedtime.   Past Month at Unknown time   Allergies: No Known Allergies  Family History  Problem Relation Age of Onset  . Bladder Cancer Mother   . Coronary artery disease Mother   . Heart disease Mother   . Stroke Father   . Alcoholism Father    Social History:  reports that he quit smoking about 20 years ago. He has never used smokeless tobacco. He reports current alcohol use. He reports that he does not use drugs.  ROS: A complete review of systems was performed.  All systems are negative except for pertinent findings as noted. Review of Systems  Genitourinary: Positive for dysuria and frequency.     Physical Exam:  Vital signs in last 24 hours: Temp:  [97.7 F (36.5 C)-99.2 F (37.3 C)] 99.2 F (37.3 C) (05/20 0731) Pulse Rate:  [92-110] 110 (05/20 0731) Resp:  [16-18] 18 (05/20 0731) BP: (132-172)/(75-99) 132/75 (05/20 0731) SpO2:  [95 %-100 %] 95 % (05/20 0731) Weight:  [99.8 kg] 99.8 kg (05/20 0037) General:  Alert and oriented, No acute distress HEENT: Normocephalic, atraumatic Cardiovascular: Regular rate and rhythm Lungs: Regular rate and effort Abdomen: Soft, nontender, nondistended, no abdominal masses Back: No CVA tenderness Extremities: No edema Neurologic:  Grossly intact  Laboratory Data:  Results for orders placed or performed during the hospital encounter of 08/26/20 (from the past 24 hour(s))  Urinalysis, Routine w reflex microscopic Urine, Clean Catch     Status: Abnormal   Collection Time: 08/26/20  1:09 AM  Result Value Ref Range   Color, Urine YELLOW YELLOW   APPearance HAZY (A) CLEAR   Specific Gravity, Urine 1.014 1.005 - 1.030   pH 5.5 5.0 - 8.0   Glucose, UA NEGATIVE NEGATIVE mg/dL   Hgb urine dipstick MODERATE (A) NEGATIVE   Bilirubin Urine NEGATIVE NEGATIVE   Ketones, ur NEGATIVE NEGATIVE mg/dL   Protein, ur 30 (A) NEGATIVE mg/dL   Nitrite NEGATIVE NEGATIVE    Leukocytes,Ua LARGE (A) NEGATIVE   RBC / HPF >50 (H) 0 - 5 RBC/hpf   WBC, UA >50 (H) 0 - 5 WBC/hpf   Bacteria, UA MANY (A) NONE SEEN   Squamous Epithelial / LPF 0-5 0 - 5   WBC Clumps PRESENT   Basic metabolic panel     Status: Abnormal   Collection Time: 08/26/20  1:09 AM  Result Value Ref Range   Sodium 133 (L) 135 - 145 mmol/L   Potassium 3.7 3.5 - 5.1 mmol/L   Chloride 100 98 - 111 mmol/L   CO2 22 22 - 32 mmol/L   Glucose, Bld 159 (H) 70 - 99 mg/dL   BUN 12 8 - 23 mg/dL   Creatinine, Ser 0.96 0.61 - 1.24 mg/dL   Calcium 9.1 8.9 - 10.3 mg/dL   GFR, Estimated >60 >60 mL/min   Anion gap 11 5 - 15  CBC with Differential/Platelet     Status: Abnormal   Collection Time: 08/26/20  1:09 AM  Result Value Ref Range   WBC 11.0 (H) 4.0 - 10.5 K/uL   RBC 4.74 4.22 - 5.81 MIL/uL   Hemoglobin 14.8 13.0 - 17.0 g/dL   HCT 43.0 39.0 - 52.0 %   MCV 90.7 80.0 - 100.0 fL   MCH 31.2 26.0 - 34.0 pg   MCHC 34.4 30.0 - 36.0 g/dL   RDW 13.2 11.5 - 15.5 %   Platelets 261 150 - 400 K/uL   nRBC 0.0 0.0 - 0.2 %   Neutrophils Relative % 66 %   Neutro Abs 7.3 1.7 - 7.7 K/uL   Lymphocytes Relative 20 %   Lymphs Abs 2.2 0.7 - 4.0 K/uL   Monocytes Relative 7 %   Monocytes Absolute 0.8 0.1 - 1.0 K/uL   Eosinophils Relative 5 %   Eosinophils Absolute 0.5 0.0 - 0.5 K/uL   Basophils Relative 1 %   Basophils Absolute 0.1 0.0 - 0.1 K/uL   Immature Granulocytes 1 %   Abs Immature Granulocytes 0.05 0.00 - 0.07 K/uL  Resp Panel by RT-PCR (Flu A&B, Covid) Nasopharyngeal Swab     Status: None   Collection Time: 08/26/20  1:56 AM   Specimen: Nasopharyngeal Swab; Nasopharyngeal(NP) swabs in vial transport medium  Result Value Ref Range   SARS Coronavirus 2 by RT PCR NEGATIVE NEGATIVE   Influenza A by PCR NEGATIVE NEGATIVE   Influenza B by PCR NEGATIVE NEGATIVE  Hemoglobin A1c     Status: Abnormal   Collection Time: 08/26/20  5:35 AM  Result Value Ref Range   Hgb A1c MFr Bld 6.7 (H) 4.8 - 5.6 %   Mean  Plasma Glucose 145.59 mg/dL  APTT     Status: None   Collection Time: 08/26/20  5:35 AM  Result  Value Ref Range   aPTT 27 24 - 36 seconds  Protime-INR     Status: None   Collection Time: 08/26/20  5:35 AM  Result Value Ref Range   Prothrombin Time 13.2 11.4 - 15.2 seconds   INR 1.0 0.8 - 1.2   Recent Results (from the past 240 hour(s))  Resp Panel by RT-PCR (Flu A&B, Covid) Nasopharyngeal Swab     Status: None   Collection Time: 08/26/20  1:56 AM   Specimen: Nasopharyngeal Swab; Nasopharyngeal(NP) swabs in vial transport medium  Result Value Ref Range Status   SARS Coronavirus 2 by RT PCR NEGATIVE NEGATIVE Final    Comment: (NOTE) SARS-CoV-2 target nucleic acids are NOT DETECTED.  The SARS-CoV-2 RNA is generally detectable in upper respiratory specimens during the acute phase of infection. The lowest concentration of SARS-CoV-2 viral copies this assay can detect is 138 copies/mL. A negative result does not preclude SARS-Cov-2 infection and should not be used as the sole basis for treatment or other patient management decisions. A negative result may occur with  improper specimen collection/handling, submission of specimen other than nasopharyngeal swab, presence of viral mutation(s) within the areas targeted by this assay, and inadequate number of viral copies(<138 copies/mL). A negative result must be combined with clinical observations, patient history, and epidemiological information. The expected result is Negative.  Fact Sheet for Patients:  EntrepreneurPulse.com.au  Fact Sheet for Healthcare Providers:  IncredibleEmployment.be  This test is no t yet approved or cleared by the [place] FDA and  has been authorized for detection and/or diagnosis of SARS-CoV-2 by FDA under an Emergency Use Authorization (EUA). This EUA will remain  in effect (meaning this test can be used) for the duration of the COVID-19 declaration under Section  564(b)(1) of the Act, 21 U.S.C.section 360bbb-3(b)(1), unless the authorization is terminated  or revoked sooner.       Influenza A by PCR NEGATIVE NEGATIVE Final   Influenza B by PCR NEGATIVE NEGATIVE Final    Comment: (NOTE) The Xpert Xpress SARS-CoV-2/FLU/RSV plus assay is intended as an aid in the diagnosis of influenza from Nasopharyngeal swab specimens and should not be used as a sole basis for treatment. Nasal washings and aspirates are unacceptable for Xpert Xpress SARS-CoV-2/FLU/RSV testing.  Fact Sheet for Patients: EntrepreneurPulse.com.au  Fact Sheet for Healthcare Providers: IncredibleEmployment.be  This test is not yet approved or cleared by the [place] FDA and has been authorized for detection and/or diagnosis of SARS-CoV-2 by FDA under an Emergency Use Authorization (EUA). This EUA will remain in effect (meaning this test can be used) for the duration of the COVID-19 declaration under Section 564(b)(1) of the Act, 21 U.S.C. section 360bbb-3(b)(1), unless the authorization is terminated or revoked.  Performed at [facility], [address] [place], [place] [address]    Creatinine: Recent Labs    08/26/20 0109  CREATININE 0.96    Impression/Assessment/plan:  I drew patient a picture of the anatomy/CT and we discussed with the patient the nature, potential benefits, risks and alternatives to cysto with right stent, including side effects of the proposed treatment, the likelihood of the patient achieving the goals of the procedure, and any potential problems that might occur during the procedure or recuperation. We discussed rationale for a staged stent then URS/HLL/stent after he's been on appropriate abx. We also discussed treating with abx, potential discharge and cont stone passage or ESWL.  I recommended a stent, staged URS as although it sounds like the bacteriuria is  chronic, he is certainly at  higher risk to develop sepsis.  All questions answered. Patient elects to proceed with cystoscopy, right ureteral stent.  I will set up cystoscopy with right ureteroscopy laser lithotripsy in several weeks. Another advantage of this approach is we can deal with both stones in the right system.   Festus Aloe 08/26/2020, 8:49 AM

## 2020-08-26 NOTE — Transfer of Care (Signed)
Immediate Anesthesia Transfer of Care Note  Patient: MONTE ZINNI  Procedure(s) Performed: CYSTOSCOPY WITH STENT PLACEMENT (Right Ureter)  Patient Location: PACU  Anesthesia Type:General  Level of Consciousness: awake, sedated, patient cooperative and responds to stimulation  Airway & Oxygen Therapy: Patient Spontanous Breathing and Patient connected to face mask oxygen  Post-op Assessment: Report given to RN, Post -op Vital signs reviewed and stable and Nasal airway placed, pt sat initially 88%, pt opens eyes to voice, some wheezing audable, MDA notified, du0-neb ordered  Post vital signs: Reviewed and stable  Last Vitals:  Vitals Value Taken Time  BP    Temp    Pulse 95 08/26/20 1735  Resp 22 08/26/20 1735  SpO2 100 % 08/26/20 1735  Vitals shown include unvalidated device data.  Last Pain:  Vitals:   08/26/20 1531  TempSrc:   PainSc: 0-No pain      Patients Stated Pain Goal: 0 (25/42/70 6237)  Complications: No complications documented.

## 2020-08-26 NOTE — Anesthesia Procedure Notes (Signed)
Procedure Name: LMA Insertion Performed by: Quanesha Klimaszewski J, CRNA Pre-anesthesia Checklist: Patient identified, Emergency Drugs available, Suction available, Patient being monitored and Timeout performed Patient Re-evaluated:Patient Re-evaluated prior to induction Oxygen Delivery Method: Circle system utilized Preoxygenation: Pre-oxygenation with 100% oxygen Induction Type: IV induction Ventilation: Mask ventilation without difficulty LMA: LMA inserted LMA Size: 4.0 Number of attempts: 1 Placement Confirmation: positive ETCO2 and breath sounds checked- equal and bilateral Tube secured with: Tape Dental Injury: Teeth and Oropharynx as per pre-operative assessment        

## 2020-08-26 NOTE — ED Triage Notes (Signed)
Patient here POV from Home with ABD Pain that has since transitioned into Lower Back Pain and Flank Pain.  Pain began 2 Hours PTA. No Medications PTA.   No Other Symptoms. Ambulatory, GCS 15.

## 2020-08-26 NOTE — Anesthesia Preprocedure Evaluation (Addendum)
Anesthesia Evaluation  Patient identified by MRN, date of birth, ID band Patient awake    Reviewed: Allergy & Precautions, NPO status , Patient's Chart, lab work & pertinent test results  Airway Mallampati: III  TM Distance: >3 FB Neck ROM: Full    Dental no notable dental hx.    Pulmonary former smoker,    Pulmonary exam normal breath sounds clear to auscultation       Cardiovascular hypertension, Pt. on home beta blockers Normal cardiovascular exam Rhythm:Regular Rate:Normal     Neuro/Psych PSYCHIATRIC DISORDERS Depression negative neurological ROS     GI/Hepatic Neg liver ROS, GERD  Medicated and Controlled,  Endo/Other  negative endocrine ROS  Renal/GU Renal disease     Musculoskeletal  (+) Arthritis ,   Abdominal (+) + obese,   Peds  Hematology negative hematology ROS (+)   Anesthesia Other Findings Right Proximal Ureteral Stone  Reproductive/Obstetrics                            Anesthesia Physical Anesthesia Plan  ASA: II  Anesthesia Plan: General   Post-op Pain Management:    Induction: Intravenous  PONV Risk Score and Plan: 2 and Ondansetron, Dexamethasone, Midazolam and Treatment may vary due to age or medical condition  Airway Management Planned: LMA  Additional Equipment:   Intra-op Plan:   Post-operative Plan: Extubation in OR  Informed Consent: I have reviewed the patients History and Physical, chart, labs and discussed the procedure including the risks, benefits and alternatives for the proposed anesthesia with the patient or authorized representative who has indicated his/her understanding and acceptance.     Dental advisory given  Plan Discussed with: CRNA  Anesthesia Plan Comments:         Anesthesia Quick Evaluation

## 2020-08-26 NOTE — ED Notes (Signed)
Care Handoff/Patient Report given to [facility]; all questions answered. Transportation en route.

## 2020-08-26 NOTE — Discharge Instructions (Signed)
Ureteral Stent Implantation, Care After This sheet gives you information about how to care for yourself after your procedure. Your health care provider may also give you more specific instructions. If you have problems or questions, contact your health care provider.  Be sure to follow-up with Dr. Junious Silk for stent/stone removal.  What can I expect after the procedure? After the procedure, it is common to have:  Nausea.  Mild pain when you urinate. You may feel this pain in your lower back or lower abdomen. The pain should stop within a few minutes after you urinate. This may last for up to 1 week.  A small amount of blood in your urine for several days. Follow these instructions at home: Medicines  Take over-the-counter and prescription medicines only as told by your health care provider.  If you were prescribed an antibiotic medicine, take it as told by your health care provider. Do not stop taking the antibiotic even if you start to feel better.  Do not drive for 24 hours if you were given a sedative during your procedure.  Ask your health care provider if the medicine prescribed to you requires you to avoid driving or using heavy machinery. Activity  Rest as told by your health care provider.  Avoid sitting for a long time without moving. Get up to take short walks every 1-2 hours. This is important to improve blood flow and breathing. Ask for help if you feel weak or unsteady.  Return to your normal activities as told by your health care provider. Ask your health care provider what activities are safe for you. General instructions  Watch for any blood in your urine. Call your health care provider if the amount of blood in your urine increases.  If you have a catheter: ? Follow instructions from your health care provider about taking care of your catheter and collection bag. ? Do not take baths, swim, or use a hot tub until your health care provider approves. Ask your health  care provider if you may take showers. You may only be allowed to take sponge baths.  Drink enough fluid to keep your urine pale yellow.  Do not use any products that contain nicotine or tobacco, such as cigarettes, e-cigarettes, and chewing tobacco. These can delay healing after surgery. If you need help quitting, ask your health care provider.  Keep all follow-up visits as told by your health care provider. This is important.   Contact a health care provider if:  You have pain that gets worse or does not get better with medicine, especially pain when you urinate.  You have difficulty urinating.  You feel nauseous or you vomit repeatedly during a period of more than 2 days after the procedure. Get help right away if:  Your urine is dark red or has blood clots in it.  You are leaking urine (have incontinence).  The end of the stent comes out of your urethra.  You cannot urinate.  You have sudden, sharp, or severe pain in your abdomen or lower back.  You have a fever.  You have swelling or pain in your legs.  You have difficulty breathing. Summary  After the procedure, it is common to have mild pain when you urinate that goes away within a few minutes after you urinate. This may last for up to 1 week.  Watch for any blood in your urine. Call your health care provider if the amount of blood in your urine increases.  Take over-the-counter  and prescription medicines only as told by your health care provider.  Drink enough fluid to keep your urine pale yellow. This information is not intended to replace advice given to you by your health care provider. Make sure you discuss any questions you have with your health care provider. Document Revised: 12/31/2017 Document Reviewed: 01/01/2018 Elsevier Patient Education  2021 [employer].

## 2020-08-26 NOTE — ED Notes (Signed)
Pt has been transferred to [facility] for admission via [facility] -

## 2020-08-26 NOTE — ED Notes (Signed)
Patient transported to CT at this Time. ?

## 2020-08-26 NOTE — Progress Notes (Signed)
Patient ID: [name], male   DOB: Jul 30, 1955, 65 y.o.   MRN: [id number] Patient admitted early this morning for back pain/abdominal pain and was found to have acute pyelonephritis with right ureteral stone at [facility].  He has been started on IV fluids and antibiotics.  Urology evaluation is pending.  Patient seen and examined at bedside and plan of care discussed with him.  I have reviewed patient's medical records including this morning's HPI, current vitals, labs, medications myself.  Continue Rocephin.  Follow cultures.  Follow urology recommendations.  Repeat a.m. labs.

## 2020-08-26 NOTE — H&P (Addendum)
History and Physical    [name] VZC:[id number] DOB: 1955/12/19 DOA: 08/26/2020  PCP: Chesley Noon, MD  Patient coming from: [facility]   Chief Complaint:  Chief Complaint  Patient presents with  . Back Pain  . Abdominal Pain     HPI:    65 year old male with past medical history of gastroesophageal reflux disease, hypertension who presents to [facility] emergency department with complaints of abdominal pain.  Patient explains that intermittently for the past several days to weeks patient has been experiencing dysuria.  Patient is also noted that his urine has appeared cloudy in the past several weeks.  Patient denies any recent fevers, sick contacts, nausea, vomiting or contact with confirmed COVID-19 infection.  Patient explains that the evening of 5/19 at approximately 10:30 PM the patient suddenly began to experience abdominal pain.  Abdominal pain was periumbilical in location, sharp in quality and radiating to the low back and right flank.  Pain is severe in intensity and worse with movement.  Patient's pain persisted over the next several hours and due to its intractable nature and severity patient eventually presented to [facility] emergency department for evaluation.  Review of Systems:   Review of Systems  Constitutional: Positive for malaise/fatigue.  Gastrointestinal: Positive for abdominal pain.  Genitourinary: Positive for dysuria and flank pain.  All other systems reviewed and are negative.   Past Medical History:  Diagnosis Date  . Acid reflux   . Arthritis   . Depression   . Glaucoma   . Hypertension     Past Surgical History:  Procedure Laterality Date  . ANKLE FRACTURE SURGERY     states broken leg not ankle  . COLONOSCOPY    . KNEE SURGERY    . 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 He has never used smokeless tobacco. He reports current alcohol use. He reports that he does not use drugs.  No Known Allergies  Family History  Problem Relation Age of Onset  . Bladder Cancer Mother   . Coronary artery disease Mother   . Heart disease Mother   . Stroke Father   . Alcoholism Father      Prior to Admission medications   Medication Sig Start Date End Date Taking? Authorizing Provider  aspirin EC 325 MG tablet Take 1 tablet (325 mg total) by mouth 2 (two) times daily. 07/14/17   Leandrew Koyanagi, MD  diclofenac (VOLTAREN) 75 MG EC tablet Take 75 mg by mouth 2 (two) times daily. 03/02/17   [provider]  loratadine (CLARITIN) 10 MG tablet Take 10 mg by mouth daily.    [provider]  metoprolol succinate (TOPROL-XL) 50 MG 24 hr tablet TAKE 1 TABLET BY MOUTH EVERY DAY 08/21/16   [provider]  Multiple Vitamins-Minerals (MULTIVITAMIN WITH MINERALS) tablet Take 1 tablet by mouth daily.    [provider]  Omega-3 Fatty Acids (FISH OIL) 1000 MG CAPS Take 1 capsule by mouth daily.    [provider]  omeprazole (PRILOSEC) 20 MG capsule Take 20 mg by mouth daily as needed.    [provider]  oxyCODONE-acetaminophen (PERCOCET) 5-325 MG tablet Take 1-2 tablets by mouth every 4 (four) hours as needed for severe pain. Patient not taking: Reported  on 08/27/2017 07/14/17   Leandrew Koyanagi, MD  senna-docusate (SENOKOT S) 8.6-50 MG tablet Take 1 tablet by mouth at bedtime as needed. 07/14/17   Leandrew Koyanagi, MD  timolol (TIMOPTIC) 0.5 % ophthalmic solution  04/21/18   [provider]  traMADol (ULTRAM) 50 MG tablet Take 1 tablet (50 mg total) by mouth 2 (two) times daily as needed for moderate pain. Patient not taking: Reported on 08/27/2017 08/14/17   Marybelle Killings, MD  travoprost, benzalkonium, (TRAVATAN) 0.004 % ophthalmic solution Place 1 drop into both eyes at bedtime.     [provider]    Physical Exam: Vitals:   08/26/20 0037 08/26/20 0157 08/26/20 0230 08/26/20 0328  BP:  (!) 172/99 (!) 163/93 (!) 158/82  Pulse:  96 98 97  Resp:  18 18 17   Temp:    98.7 F (37.1 C)  TempSrc:    Oral  SpO2:  96% 96% 96%  Weight: 99.8 kg     Height: 5\' 5"  (1.651 m)       Constitutional: Awake alert and oriented x3, patient is in mild distress due to low back pain. Skin: no rashes, no lesions, somewhat poor skin turgor.   Eyes: Pupils are equally reactive to light.  No evidence of scleral icterus or conjunctival pallor.  ENMT: Dry mucous membranes noted.  Posterior pharynx clear of any exudate or lesions.   Neck: normal, supple, no masses, no thyromegaly.  No evidence of jugular venous distension.   Respiratory: clear to auscultation bilaterally, no wheezing, no crackles. Normal respiratory effort. No accessory muscle use.  Cardiovascular: Regular rate and rhythm, no murmurs / rubs / gallops. No extremity edema. 2+ pedal pulses. No carotid bruits.  Chest:   Nontender without crepitus or deformity.   Back:   Nontender without crepitus or deformity. Abdomen: Notable tenderness in the right upper and right lower quadrants.  Abdomen is soft however.    No evidence of intra-abdominal masses.  Positive bowel sounds noted in all quadrants.   Musculoskeletal: No joint deformity upper and lower extremities. Good ROM, no contractures. Normal muscle tone.  Neurologic: CN 2-12 grossly intact. Sensation intact.  Patient moving all 4 extremities spontaneously.  Patient is following all commands.  Patient is responsive to verbal stimuli.   Psychiatric: Patient exhibits normal mood with appropriate affect.  Patient seems to possess insight as to their current situation.     Labs on Admission: I have personally reviewed following labs and imaging studies -   CBC: Recent Labs  Lab 08/26/20 0109  WBC 11.0*  NEUTROABS 7.3  HGB 14.8  HCT 43.0  MCV 90.7  PLT 0000000    Basic Metabolic Panel: Recent Labs  Lab 08/26/20 0109  NA 133*  K 3.7  CL 100  CO2 22  GLUCOSE 159*  BUN 12  CREATININE 0.96  CALCIUM 9.1   GFR: Estimated Creatinine Clearance: 82.2 mL/min (by C-G formula based on SCr of 0.96 mg/dL). Liver Function Tests: No results for input(s): AST, ALT, ALKPHOS, BILITOT, PROT, ALBUMIN in the last 168 hours. No results for input(s): LIPASE, AMYLASE in the last 168 hours. No results for input(s): AMMONIA in the last 168 hours. Coagulation Profile: No results for input(s): INR, PROTIME in the last 168 hours. Cardiac Enzymes: No results for input(s): CKTOTAL, CKMB, CKMBINDEX, TROPONINI in the last 168 hours. BNP (last 3 results) No results for input(s): PROBNP in the last 8760 hours. HbA1C: No results for input(s): HGBA1C in the last 72  hours. CBG: No results for input(s): GLUCAP in the last 168 hours. Lipid Profile: No results for input(s): CHOL, HDL, LDLCALC, TRIG, CHOLHDL, LDLDIRECT in the last 72 hours. Thyroid Function Tests: No results for input(s): TSH, T4TOTAL, FREET4, T3FREE, THYROIDAB in the last 72 hours. Anemia Panel: No results for input(s): VITAMINB12, FOLATE, FERRITIN, TIBC, IRON, RETICCTPCT in the last 72 hours. Urine analysis:    Component Value Date/Time   COLORURINE YELLOW 08/26/2020 0109   APPEARANCEUR HAZY (A) 08/26/2020 0109   LABSPEC 1.014 08/26/2020 0109   PHURINE 5.5 08/26/2020 0109   GLUCOSEU NEGATIVE 08/26/2020 0109   HGBUR MODERATE (A) 08/26/2020 0109   BILIRUBINUR NEGATIVE 08/26/2020 0109   KETONESUR NEGATIVE 08/26/2020 0109   PROTEINUR 30 (A) 08/26/2020 0109   UROBILINOGEN 0.2 05/08/2008 2247   NITRITE NEGATIVE 08/26/2020 0109   LEUKOCYTESUR LARGE (A) 08/26/2020 0109    Radiological Exams on Admission - Personally Reviewed: CT Renal Stone Study  Result Date: 08/26/2020 CLINICAL DATA:  Flank pain EXAM: CT ABDOMEN AND PELVIS WITHOUT CONTRAST TECHNIQUE: Multidetector CT imaging of the abdomen and  pelvis was performed following the standard protocol without IV contrast. COMPARISON:  None. FINDINGS: LOWER CHEST: Normal. HEPATOBILIARY: Normal hepatic contours. No intra- or extrahepatic biliary dilatation. The gallbladder is normal. PANCREAS: Normal pancreas. No ductal dilatation or peripancreatic fluid collection. SPLEEN: Normal. ADRENALS/URINARY TRACT: The adrenal glands are normal. Right kidney is edematous with pelviectasis and moderate perinephric stranding. There is a stone at the right ureteropelvic junction measuring 5 mm. There is a nonobstructing interpolar stone that measures 9 mm. Left kidney is normal. The urinary bladder is normal for degree of distention STOMACH/BOWEL: There is no hiatal hernia. Normal duodenal course and caliber. No small bowel dilatation or inflammation. No focal colonic abnormality. The appendix is not visualized. No right lower quadrant inflammation or free fluid. VASCULAR/LYMPHATIC: There is calcific atherosclerosis of the abdominal aorta. No lymphadenopathy. REPRODUCTIVE: There are calcifications within the normal-sized prostate. Symmetric seminal vesicles. MUSCULOSKELETAL. No bony spinal canal stenosis or focal osseous abnormality. OTHER: None. IMPRESSION: 1. Right-sided obstructive uropathy with 5 mm stone at the right ureteropelvic junction causing pelviectasis and moderate perinephric stranding. 2. Nonobstructing 9 mm interpolar stone. 3.  Aortic atherosclerosis (ICD10-I70.0). Electronically Signed   By: Ulyses Jarred M.D.   On: 08/26/2020 01:27    Assessment/Plan Principal Problem:   Acute pyelonephritis with right ureteral stone   Patient presenting with rather sudden and rapid onset of abdominal pain and back pain  Patient presenting with CT evidence of right pyelonephritis with a 5 mm obstructive right ureteral stone at the ureteropelvic junction  Urinalysis supportive of urinary tract infection/pyelonephritis   Treating patient with intravenous  ceftriaxone   Hydrating patient with intravenous isotonic fluids   Blood and urine cultures have been obtained   Case has been discussed between emergency department provider and Dr. Junious Silk with urology who stated that he will come evaluate patient in consultation this morning.    As needed opiate-based analgesics for associated pain.    Active Problems:   GERD without esophagitis   Continue daily PPI    Essential hypertension   Continue home regimen of metoprolol  As needed intravenous antihypertensives for markedly elevated blood pressure.    Hyponatremia   Patient exhibiting mild hyponatremia likely secondary to volume depletion  Hydrating patient with intravenous isotonic fluids  Monitoring sodium levels with serial chemistries    Hyperglycemia   Notable substantial hyperglycemia on initial chemistry without a history of diabetes mellitus  Obtain  hemoglobin A1c   Code Status:  Full code Family Communication: deferred   Status is: Inpatient  Remains inpatient appropriate because:Ongoing active pain requiring inpatient pain management, IV treatments appropriate due to intensity of illness or inability to take PO and Inpatient level of care appropriate due to severity of illness   Dispo: The patient is from: Home              Anticipated d/c is to: Home              Patient currently is not medically stable to d/c.   Difficult to place patient No        Vernelle Emerald MD [facility] Pager [phone]  If 7PM-7AM, please contact night-coverage [email or web address] Use universal [place] password for that web site. If you do not have the password, please call the hospital operator.  08/26/2020, 5:20 AM

## 2020-08-26 NOTE — Progress Notes (Signed)
Pt shaky with chills in pre-op area but says it's more nerves and the AC. Discussed again nature rba to stent and staged URS/HLL. All questions answered. I'll give him another g rocephin. Only 1 g given earlier.

## 2020-08-27 DIAGNOSIS — N10 Acute tubulo-interstitial nephritis: Secondary | ICD-10-CM | POA: Diagnosis not present

## 2020-08-27 DIAGNOSIS — R739 Hyperglycemia, unspecified: Secondary | ICD-10-CM | POA: Diagnosis not present

## 2020-08-27 DIAGNOSIS — E871 Hypo-osmolality and hyponatremia: Secondary | ICD-10-CM | POA: Diagnosis not present

## 2020-08-27 DIAGNOSIS — I10 Essential (primary) hypertension: Secondary | ICD-10-CM | POA: Diagnosis not present

## 2020-08-27 LAB — CBC WITH DIFFERENTIAL/PLATELET
Abs Immature Granulocytes: 0.19 10*3/uL — ABNORMAL HIGH (ref 0.00–0.07)
Basophils Absolute: 0 10*3/uL (ref 0.0–0.1)
Basophils Relative: 0 %
Eosinophils Absolute: 0 10*3/uL (ref 0.0–0.5)
Eosinophils Relative: 0 %
HCT: 43.3 % (ref 39.0–52.0)
Hemoglobin: 14.4 g/dL (ref 13.0–17.0)
Immature Granulocytes: 1 %
Lymphocytes Relative: 5 %
Lymphs Abs: 1.1 10*3/uL (ref 0.7–4.0)
MCH: 31.2 pg (ref 26.0–34.0)
MCHC: 33.3 g/dL (ref 30.0–36.0)
MCV: 93.9 fL (ref 80.0–100.0)
Monocytes Absolute: 0.9 10*3/uL (ref 0.1–1.0)
Monocytes Relative: 4 %
Neutro Abs: 19.8 10*3/uL — ABNORMAL HIGH (ref 1.7–7.7)
Neutrophils Relative %: 90 %
Platelets: 228 10*3/uL (ref 150–400)
RBC: 4.61 MIL/uL (ref 4.22–5.81)
RDW: 13.6 % (ref 11.5–15.5)
WBC: 22 10*3/uL — ABNORMAL HIGH (ref 4.0–10.5)
nRBC: 0 % (ref 0.0–0.2)

## 2020-08-27 LAB — BASIC METABOLIC PANEL
Anion gap: 9 (ref 5–15)
BUN: 13 mg/dL (ref 8–23)
CO2: 19 mmol/L — ABNORMAL LOW (ref 22–32)
Calcium: 8.9 mg/dL (ref 8.9–10.3)
Chloride: 110 mmol/L (ref 98–111)
Creatinine, Ser: 0.9 mg/dL (ref 0.61–1.24)
GFR, Estimated: >60 mL/min (ref >60)
Glucose, Bld: 206 mg/dL — ABNORMAL HIGH (ref 70–99)
Potassium: 4 mmol/L (ref 3.5–5.1)
Sodium: 138 mmol/L (ref 135–145)

## 2020-08-27 LAB — MAGNESIUM: Magnesium: 2.1 mg/dL (ref 1.7–2.4)

## 2020-08-27 MED ORDER — ONDANSETRON HCL 4 MG PO TABS: 4.0000 mg | ORAL_TABLET | Freq: Four times a day (QID) | ORAL | 0 refills | Status: AC | PRN

## 2020-08-27 MED ORDER — CEFUROXIME AXETIL 500 MG PO TABS: 500.0000 mg | ORAL_TABLET | Freq: Two times a day (BID) | ORAL | 0 refills | Status: AC

## 2020-08-27 MED ORDER — TRAMADOL HCL 50 MG PO TABS: 50.0000 mg | ORAL_TABLET | Freq: Four times a day (QID) | ORAL | 0 refills | Status: DC | PRN

## 2020-08-27 NOTE — Care Management Obs Status (Signed)
[facility] NOTIFICATION   Patient Details  Name: [name] MRN: [id number] Date of Birth: 21-May-1955   Medicare Observation Status Notification Given:  Yes    MahabirJuliann Pulse, RN 08/27/2020, 12:25 PM

## 2020-08-27 NOTE — Discharge Summary (Signed)
Physician Discharge Summary  [name] HYI:[id number] DOB: [birth year] DOA: 08/26/2020  PCP: Chesley Noon, MD  Admit date: 08/26/2020 Discharge date: 08/27/2020  Admitted From: Home Disposition: Home  Recommendations for Outpatient Follow-up:  1. Follow up with PCP in 1 week with repeat CBC/BMP 2. Outpatient follow-up with urology/Dr. Junious Silk 3. Follow up in ED if symptoms worsen or new appear   Home Health: No Equipment/Devices: None  Discharge Condition: Stable CODE STATUS: Full Diet recommendation: Heart healthy  Brief/Interim Summary: 65 year old male with history of GERD, hypertension presented with back pain and abdominal pain and was found to have acute pyonephritis with right ureteral stone at the UPJ.  He was started on IV antibiotics.  He underwent cystoscopy and right ureteral stent placement by urology on 08/26/2020.  Subsequently, his condition has improved.  He is afebrile and hemodynamically stable.  He has significant leukocytosis but he is adamant about going home today.  Cultures are negative so far.  Urology has cleared the patient for discharge home today.  He will be discharged home today on oral Ceftin.  Discharge Diagnoses:   Acute pyelonephritis with right ureteral stone -Presented with CT evidence of right pyelonephritis with a 5 mm obstructive right ureteral stone at the ureteropelvic junction -He was started on IV antibiotics.  He underwent cystoscopy and right ureteral stent placement by urology on 08/26/2020.  Subsequently, his condition has improved.  He is afebrile and hemodynamically stable.  He has significant leukocytosis but he is adamant about going home today.  Cultures are negative so far.  Spoke to urology/Dr. Junious Silk on phone today and he has cleared the patient for discharge home today.  He will be discharged home today on oral Ceftin for 10 days.  Outpatient follow-up with Dr. Junious Silk.  Leukocytosis -White count significantly worse at  22 today.  Outpatient follow-up.  Hypertension -Blood pressure stable.  Continue metoprolol  Hyperglycemia -A1c 6.7.  Outpatient follow-up with PCP  Hyponatremia -Resolved    Discharge Instructions  Discharge Instructions    Diet - low sodium heart healthy   Complete by: As directed    Drink plenty of fluids and keep yourself hydrated   Increase activity slowly   Complete by: As directed    No wound care   Complete by: As directed      Allergies as of 08/27/2020   No Known Allergies     Medication List    TAKE these medications   aspirin EC 325 MG tablet Take 1 tablet (325 mg total) by mouth 2 (two) times daily.   cefUROXime 500 MG tablet Commonly known as: CEFTIN Take 1 tablet (500 mg total) by mouth 2 (two) times daily for 10 days.   loratadine 10 MG tablet Commonly known as: CLARITIN Take 10 mg by mouth daily as needed for allergies.   metoprolol succinate 50 MG 24 hr tablet Commonly known as: TOPROL-XL TAKE 1 TABLET BY MOUTH EVERY DAY   omeprazole 20 MG capsule Commonly known as: PRILOSEC Take 20 mg by mouth daily as needed.   ondansetron 4 MG tablet Commonly known as: ZOFRAN Take 1 tablet (4 mg total) by mouth every 6 (six) hours as needed for nausea.   timolol 0.5 % ophthalmic solution Commonly known as: TIMOPTIC Place 1 drop into both eyes daily.   traMADol 50 MG tablet Commonly known as: Ultram Take 1 tablet (50 mg total) by mouth every 6 (six) hours as needed for severe pain.   travoprost (benzalkonium) 0.004 % ophthalmic  solution Commonly known as: TRAVATAN Place 1 drop into both eyes at bedtime.       Follow-up Information    Festus Aloe, MD.   Specialty: Urology Why: Office will call with appointment Contact information: [place] [address] [phone]        Chesley Noon, MD. Schedule an appointment as soon as possible for a visit in 1 week(s).   Specialty: Family Medicine Contact  information: [place] [id number] [phone]              No Known Allergies  Consultations:  Urology   Procedures/Studies: DG C-Arm 1-60 Min-No Report  Result Date: 08/26/2020 Fluoroscopy was utilized by the requesting physician.  No radiographic interpretation.   CT Renal Stone Study  Result Date: 08/26/2020 CLINICAL DATA:  Flank pain EXAM: CT ABDOMEN AND PELVIS WITHOUT CONTRAST TECHNIQUE: Multidetector CT imaging of the abdomen and pelvis was performed following the standard protocol without IV contrast. COMPARISON:  None. FINDINGS: LOWER CHEST: Normal. HEPATOBILIARY: Normal hepatic contours. No intra- or extrahepatic biliary dilatation. The gallbladder is normal. PANCREAS: Normal pancreas. No ductal dilatation or peripancreatic fluid collection. SPLEEN: Normal. ADRENALS/URINARY TRACT: The adrenal glands are normal. Right kidney is edematous with pelviectasis and moderate perinephric stranding. There is a stone at the right ureteropelvic junction measuring 5 mm. There is a nonobstructing interpolar stone that measures 9 mm. Left kidney is normal. The urinary bladder is normal for degree of distention STOMACH/BOWEL: There is no hiatal hernia. Normal duodenal course and caliber. No small bowel dilatation or inflammation. No focal colonic abnormality. The appendix is not visualized. No right lower quadrant inflammation or free fluid. VASCULAR/LYMPHATIC: There is calcific atherosclerosis of the abdominal aorta. No lymphadenopathy. REPRODUCTIVE: There are calcifications within the normal-sized prostate. Symmetric seminal vesicles. MUSCULOSKELETAL. No bony spinal canal stenosis or focal osseous abnormality. OTHER: None. IMPRESSION: 1. Right-sided obstructive uropathy with 5 mm stone at the right ureteropelvic junction causing pelviectasis and moderate perinephric stranding. 2. Nonobstructing 9 mm interpolar stone. 3.  Aortic atherosclerosis (ICD10-I70.0). Electronically  Signed   By: Ulyses Jarred M.D.   On: 08/26/2020 01:27       Subjective: Patient seen and examined at bedside.  No overnight fever, vomiting, worsening flank pain reported.  Is adamant about going home today.  Discharge Exam: Vitals:   08/26/20 2103 08/27/20 0522  BP: 116/68 136/81  Pulse: 91 88  Resp: 17 18  Temp: 98.6 F (37 C) 97.7 F (36.5 C)  SpO2: 94% 98%    General: Pt is alert, awake, not in acute distress Cardiovascular: rate controlled, S1/S2 + Respiratory: bilateral decreased breath sounds at bases Abdominal: Soft, obese, NT, ND, bowel sounds + Extremities: no edema, no cyanosis    The results of significant diagnostics from this hospitalization (including imaging, microbiology, ancillary and laboratory) are listed below for reference.     Microbiology: Recent Results (from the past 240 hour(s))  Resp Panel by RT-PCR (Flu A&B, Covid) Nasopharyngeal Swab     Status: None   Collection Time: 08/26/20  1:56 AM   Specimen: Nasopharyngeal Swab; Nasopharyngeal(NP) swabs in vial transport medium  Result Value Ref Range Status   SARS Coronavirus 2 by RT PCR NEGATIVE NEGATIVE Final    Comment: (NOTE) SARS-CoV-2 target nucleic acids are NOT DETECTED.  The SARS-CoV-2 RNA is generally detectable in upper respiratory specimens during the acute phase of infection. The lowest concentration of SARS-CoV-2 viral copies this assay can detect is 138 copies/mL.  A negative result does not preclude SARS-Cov-2 infection and should not be used as the sole basis for treatment or other patient management decisions. A negative result may occur with  improper specimen collection/handling, submission of specimen other than nasopharyngeal swab, presence of viral mutation(s) within the areas targeted by this assay, and inadequate number of viral copies(<138 copies/mL). A negative result must be combined with clinical observations, patient history, and epidemiological information. The  expected result is Negative.  Fact Sheet for Patients:  EntrepreneurPulse.com.au  Fact Sheet for Healthcare Providers:  IncredibleEmployment.be  This test is no t yet approved or cleared by the [place] FDA and  has been authorized for detection and/or diagnosis of SARS-CoV-2 by FDA under an Emergency Use Authorization (EUA). This EUA will remain  in effect (meaning this test can be used) for the duration of the COVID-19 declaration under Section 564(b)(1) of the Act, 21 U.S.C.section 360bbb-3(b)(1), unless the authorization is terminated  or revoked sooner.       Influenza A by PCR NEGATIVE NEGATIVE Final   Influenza B by PCR NEGATIVE NEGATIVE Final    Comment: (NOTE) The Xpert Xpress SARS-CoV-2/FLU/RSV plus assay is intended as an aid in the diagnosis of influenza from Nasopharyngeal swab specimens and should not be used as a sole basis for treatment. Nasal washings and aspirates are unacceptable for Xpert Xpress SARS-CoV-2/FLU/RSV testing.  Fact Sheet for Patients: EntrepreneurPulse.com.au  Fact Sheet for Healthcare Providers: IncredibleEmployment.be  This test is not yet approved or cleared by the [place] FDA and has been authorized for detection and/or diagnosis of SARS-CoV-2 by FDA under an Emergency Use Authorization (EUA). This EUA will remain in effect (meaning this test can be used) for the duration of the COVID-19 declaration under Section 564(b)(1) of the Act, 21 U.S.C. section 360bbb-3(b)(1), unless the authorization is terminated or revoked.  Performed at [facility], [address], [place], [place] [address]      Labs: BNP (last 3 results) No results for input(s): BNP in the last 8760 hours. Basic Metabolic Panel: Recent Labs  Lab 08/26/20 0109 08/27/20 0626  NA 133* 138  K 3.7 4.0  CL 100 110  CO2 22 19*  GLUCOSE 159* 206*  BUN 12 13   CREATININE 0.96 0.90  CALCIUM 9.1 8.9  MG  --  2.1   Liver Function Tests: No results for input(s): AST, ALT, ALKPHOS, BILITOT, PROT, ALBUMIN in the last 168 hours. No results for input(s): LIPASE, AMYLASE in the last 168 hours. No results for input(s): AMMONIA in the last 168 hours. CBC: Recent Labs  Lab 08/26/20 0109 08/27/20 0626  WBC 11.0* 22.0*  NEUTROABS 7.3 19.8*  HGB 14.8 14.4  HCT 43.0 43.3  MCV 90.7 93.9  PLT 261 228   Cardiac Enzymes: No results for input(s): CKTOTAL, CKMB, CKMBINDEX, TROPONINI in the last 168 hours. BNP: Invalid input(s): POCBNP CBG: No results for input(s): GLUCAP in the last 168 hours. D-Dimer No results for input(s): DDIMER in the last 72 hours. Hgb A1c Recent Labs    08/26/20 0535  HGBA1C 6.7*   Lipid Profile No results for input(s): CHOL, HDL, LDLCALC, TRIG, CHOLHDL, LDLDIRECT in the last 72 hours. Thyroid function studies No results for input(s): TSH, T4TOTAL, T3FREE, THYROIDAB in the last 72 hours.  Invalid input(s): FREET3 Anemia work up No results for input(s): VITAMINB12, FOLATE, FERRITIN, TIBC, IRON, RETICCTPCT in the last 72 hours. Urinalysis    Component Value Date/Time   COLORURINE YELLOW 08/26/2020 0109  APPEARANCEUR HAZY (A) 08/26/2020 0109   LABSPEC 1.014 08/26/2020 0109   PHURINE 5.5 08/26/2020 0109   GLUCOSEU NEGATIVE 08/26/2020 0109   HGBUR MODERATE (A) 08/26/2020 0109   BILIRUBINUR NEGATIVE 08/26/2020 0109   KETONESUR NEGATIVE 08/26/2020 0109   PROTEINUR 30 (A) 08/26/2020 0109   UROBILINOGEN 0.2 05/08/2008 2247   NITRITE NEGATIVE 08/26/2020 0109   LEUKOCYTESUR LARGE (A) 08/26/2020 0109   Sepsis Labs Invalid input(s): PROCALCITONIN,  WBC,  LACTICIDVEN Microbiology Recent Results (from the past 240 hour(s))  Resp Panel by RT-PCR (Flu A&B, Covid) Nasopharyngeal Swab     Status: None   Collection Time: 08/26/20  1:56 AM   Specimen: Nasopharyngeal Swab; Nasopharyngeal(NP) swabs in vial transport medium   Result Value Ref Range Status   SARS Coronavirus 2 by RT PCR NEGATIVE NEGATIVE Final    Comment: (NOTE) SARS-CoV-2 target nucleic acids are NOT DETECTED.  The SARS-CoV-2 RNA is generally detectable in upper respiratory specimens during the acute phase of infection. The lowest concentration of SARS-CoV-2 viral copies this assay can detect is 138 copies/mL. A negative result does not preclude SARS-Cov-2 infection and should not be used as the sole basis for treatment or other patient management decisions. A negative result may occur with  improper specimen collection/handling, submission of specimen other than nasopharyngeal swab, presence of viral mutation(s) within the areas targeted by this assay, and inadequate number of viral copies(<138 copies/mL). A negative result must be combined with clinical observations, patient history, and epidemiological information. The expected result is Negative.  Fact Sheet for Patients:  EntrepreneurPulse.com.au  Fact Sheet for Healthcare Providers:  IncredibleEmployment.be  This test is no t yet approved or cleared by the [place] FDA and  has been authorized for detection and/or diagnosis of SARS-CoV-2 by FDA under an Emergency Use Authorization (EUA). This EUA will remain  in effect (meaning this test can be used) for the duration of the COVID-19 declaration under Section 564(b)(1) of the Act, 21 U.S.C.section 360bbb-3(b)(1), unless the authorization is terminated  or revoked sooner.       Influenza A by PCR NEGATIVE NEGATIVE Final   Influenza B by PCR NEGATIVE NEGATIVE Final    Comment: (NOTE) The Xpert Xpress SARS-CoV-2/FLU/RSV plus assay is intended as an aid in the diagnosis of influenza from Nasopharyngeal swab specimens and should not be used as a sole basis for treatment. Nasal washings and aspirates are unacceptable for Xpert Xpress SARS-CoV-2/FLU/RSV testing.  Fact Sheet for  Patients: EntrepreneurPulse.com.au  Fact Sheet for Healthcare Providers: IncredibleEmployment.be  This test is not yet approved or cleared by the [place] FDA and has been authorized for detection and/or diagnosis of SARS-CoV-2 by FDA under an Emergency Use Authorization (EUA). This EUA will remain in effect (meaning this test can be used) for the duration of the COVID-19 declaration under Section 564(b)(1) of the Act, 21 U.S.C. section 360bbb-3(b)(1), unless the authorization is terminated or revoked.  Performed at [facility], [address] [place], [place] [address]      Time coordinating discharge: 35 minutes  SIGNED:   Aline August, MD  [facility] 08/27/2020, 11:04 AM

## 2020-08-27 NOTE — Progress Notes (Signed)
Pt being discharged to home. Discharge instructions and medication education provided to pt.  

## 2020-08-27 NOTE — Care Management CC44 (Signed)
Condition Code 44 Documentation Completed  Patient Details  Name: [name] MRN: [id number] Date of Birth: November 22, 1955   Condition Code 44 given:  Yes Patient signature on Condition Code 44 notice:  Yes Documentation of 2 MD's agreement:  Yes Code 44 added to claim:  Yes    Dessa Phi, RN 08/27/2020, 12:25 PM

## 2020-08-28 LAB — URINE CULTURE
Culture: >=100000 — AB
Special Requests: NORMAL

## 2020-08-28 NOTE — Anesthesia Postprocedure Evaluation (Signed)
Anesthesia Post Note  Patient: [name]  Procedure(s) Performed: CYSTOSCOPY WITH STENT PLACEMENT (Right Ureter)     Patient location during evaluation: PACU Anesthesia Type: General Level of consciousness: awake Pain management: pain level controlled Vital Signs Assessment: post-procedure vital signs reviewed and stable Respiratory status: spontaneous breathing, nonlabored ventilation, respiratory function stable and patient connected to nasal cannula oxygen Cardiovascular status: blood pressure returned to baseline and stable Postop Assessment: no apparent nausea or vomiting Anesthetic complications: no   No complications documented.  Last Vitals:  Vitals:   08/26/20 2103 08/27/20 0522  BP: 116/68 136/81  Pulse: 91 88  Resp: 17 18  Temp: 37 C 36.5 C  SpO2: 94% 98%    Last Pain:  Vitals:   08/27/20 0800  TempSrc:   PainSc: 0-No pain                 Riad Wagley P Daejah Klebba

## 2020-08-29 ENCOUNTER — Encounter (HOSPITAL_COMMUNITY): Payer: Self-pay | Admitting: Urology

## 2020-08-31 LAB — CULTURE, BLOOD (ROUTINE X 2)
Culture: NO GROWTH
Culture: NO GROWTH
Special Requests: ADEQUATE
Special Requests: ADEQUATE

## 2020-09-07 ENCOUNTER — Other Ambulatory Visit: Payer: Self-pay | Admitting: Urology

## 2020-09-20 ENCOUNTER — Encounter (HOSPITAL_BASED_OUTPATIENT_CLINIC_OR_DEPARTMENT_OTHER): Payer: Self-pay | Admitting: Urology

## 2020-09-20 ENCOUNTER — Other Ambulatory Visit: Payer: Self-pay

## 2020-09-20 NOTE — Progress Notes (Signed)
Spoke w/ via phone for pre-op interview--- Pt Lab needs dos----  Istat and EKG             Lab results------ no COVID test -----patient states asymptomatic no test needed Arrive at ------- 0530 on 09-27-2020 NPO after MN NO Solid Food.  Clear liquids from MN until--- 0430 Med rec completed Medications to take morning of surgery ----- Claritin, Torprol Diabetic medication ----- n/a Patient instructed no nail polish to be worn day of surgery Patient instructed to bring photo id and insurance card day of surgery Patient aware to have Driver (ride ) / caregiver for 24 hours after surgery -- wife, Judson Roch Patient Special Instructions ----- n/a Pre-Op special Istructions ----- n/a Patient verbalized understanding of instructions that were given at this phone interview. Patient denies shortness of breath, chest pain, fever, cough at this phone interview.

## 2020-09-27 ENCOUNTER — Other Ambulatory Visit: Payer: Self-pay

## 2020-09-27 ENCOUNTER — Encounter (HOSPITAL_BASED_OUTPATIENT_CLINIC_OR_DEPARTMENT_OTHER)
Admission: RE | Disposition: A | Discharge status: Home / Self Care | Payer: Self-pay | Source: Ambulatory Visit | Attending: Urology

## 2020-09-27 ENCOUNTER — Ambulatory Visit (HOSPITAL_BASED_OUTPATIENT_CLINIC_OR_DEPARTMENT_OTHER): Payer: Medicare HMO | Admitting: Anesthesiology

## 2020-09-27 ENCOUNTER — Other Ambulatory Visit (HOSPITAL_COMMUNITY): Payer: Self-pay

## 2020-09-27 ENCOUNTER — Ambulatory Visit (HOSPITAL_BASED_OUTPATIENT_CLINIC_OR_DEPARTMENT_OTHER)
Admission: RE | Admit: 2020-09-27 | Discharge: 2020-09-27 | Disposition: A | Discharge status: Home / Self Care | Payer: Medicare HMO | Source: Ambulatory Visit | Attending: Urology | Admitting: Urology

## 2020-09-27 ENCOUNTER — Encounter (HOSPITAL_BASED_OUTPATIENT_CLINIC_OR_DEPARTMENT_OTHER): Payer: Self-pay | Admitting: Urology

## 2020-09-27 DIAGNOSIS — N132 Hydronephrosis with renal and ureteral calculous obstruction: Secondary | ICD-10-CM | POA: Insufficient documentation

## 2020-09-27 DIAGNOSIS — N201 Calculus of ureter: Secondary | ICD-10-CM

## 2020-09-27 DIAGNOSIS — Z87891 Personal history of nicotine dependence: Secondary | ICD-10-CM | POA: Diagnosis not present

## 2020-09-27 DIAGNOSIS — Z79899 Other long term (current) drug therapy: Secondary | ICD-10-CM | POA: Diagnosis not present

## 2020-09-27 DIAGNOSIS — Z96651 Presence of right artificial knee joint: Secondary | ICD-10-CM | POA: Diagnosis not present

## 2020-09-27 HISTORY — DX: Calculus of ureter: N20.1

## 2020-09-27 HISTORY — DX: Unspecified glaucoma: H40.9

## 2020-09-27 HISTORY — DX: Dysuria: R30.0

## 2020-09-27 HISTORY — DX: Gastro-esophageal reflux disease without esophagitis: K21.9

## 2020-09-27 HISTORY — PX: CYSTOSCOPY/URETEROSCOPY/HOLMIUM LASER/STENT PLACEMENT: SHX6546

## 2020-09-27 HISTORY — DX: Presence of spectacles and contact lenses: Z97.3

## 2020-09-27 HISTORY — DX: Calculus of kidney: N20.0

## 2020-09-27 HISTORY — DX: Urgency of urination: R39.15

## 2020-09-27 LAB — POCT I-STAT, CHEM 8
BUN: 9 mg/dL (ref 8–23)
Calcium, Ion: 1.21 mmol/L (ref 1.15–1.40)
Chloride: 105 mmol/L (ref 98–111)
Creatinine, Ser: 0.9 mg/dL (ref 0.61–1.24)
Glucose, Bld: 140 mg/dL — ABNORMAL HIGH (ref 70–99)
HCT: 45 % (ref 39.0–52.0)
Hemoglobin: 15.3 g/dL (ref 13.0–17.0)
Potassium: 4.1 mmol/L (ref 3.5–5.1)
Sodium: 141 mmol/L (ref 135–145)
TCO2: 24 mmol/L (ref 22–32)

## 2020-09-27 SURGERY — CYSTOSCOPY/URETEROSCOPY/HOLMIUM LASER/STENT PLACEMENT
Anesthesia: General | Site: Ureter | Laterality: Right

## 2020-09-27 MED ORDER — LIDOCAINE HCL (CARDIAC) PF 100 MG/5ML IV SOSY
PREFILLED_SYRINGE | INTRAVENOUS | Status: DC | PRN
  Administered 2020-09-27: 100 mg via INTRAVENOUS

## 2020-09-27 MED ORDER — SODIUM CHLORIDE 0.9 % IR SOLN
Status: DC | PRN
  Administered 2020-09-27: 3000 mL via INTRAVESICAL

## 2020-09-27 MED ORDER — LIDOCAINE HCL URETHRAL/MUCOSAL 2 % EX GEL
CUTANEOUS | Status: DC | PRN
  Administered 2020-09-27: 1 via TOPICAL

## 2020-09-27 MED ORDER — FENTANYL CITRATE (PF) 100 MCG/2ML IJ SOLN
INTRAMUSCULAR | Status: AC
  Filled 2020-09-27: qty 2

## 2020-09-27 MED ORDER — LIDOCAINE HCL (PF) 2 % IJ SOLN
INTRAMUSCULAR | Status: AC
  Filled 2020-09-27: qty 5

## 2020-09-27 MED ORDER — CEFAZOLIN SODIUM-DEXTROSE 2-4 GM/100ML-% IV SOLN
INTRAVENOUS | Status: AC
  Filled 2020-09-27: qty 100

## 2020-09-27 MED ORDER — 0.9 % SODIUM CHLORIDE (POUR BTL) OPTIME
TOPICAL | Status: DC | PRN
  Administered 2020-09-27: 500 mL

## 2020-09-27 MED ORDER — ONDANSETRON HCL 4 MG/2ML IJ SOLN
INTRAMUSCULAR | Status: AC
  Filled 2020-09-27: qty 2

## 2020-09-27 MED ORDER — LACTATED RINGERS IV SOLN: INTRAVENOUS | Status: DC

## 2020-09-27 MED ORDER — CEFAZOLIN SODIUM-DEXTROSE 2-4 GM/100ML-% IV SOLN
2.0000 g | Freq: Once | INTRAVENOUS | Status: AC
  Administered 2020-09-27: 2 g via INTRAVENOUS

## 2020-09-27 MED ORDER — ONDANSETRON HCL 4 MG/2ML IJ SOLN: 4.0000 mg | Freq: Once | INTRAMUSCULAR | Status: DC | PRN

## 2020-09-27 MED ORDER — ACETAMINOPHEN 500 MG PO TABS
1000.0000 mg | ORAL_TABLET | Freq: Once | ORAL | Status: AC
  Administered 2020-09-27: 1000 mg via ORAL

## 2020-09-27 MED ORDER — PROPOFOL 10 MG/ML IV BOLUS
INTRAVENOUS | Status: DC | PRN
  Administered 2020-09-27: 170 mg via INTRAVENOUS

## 2020-09-27 MED ORDER — ONDANSETRON HCL 4 MG/2ML IJ SOLN
INTRAMUSCULAR | Status: DC | PRN
  Administered 2020-09-27: 4 mg via INTRAVENOUS

## 2020-09-27 MED ORDER — DEXAMETHASONE SODIUM PHOSPHATE 10 MG/ML IJ SOLN
INTRAMUSCULAR | Status: AC
  Filled 2020-09-27: qty 1

## 2020-09-27 MED ORDER — MIDAZOLAM HCL 2 MG/2ML IJ SOLN
INTRAMUSCULAR | Status: AC
  Filled 2020-09-27: qty 2

## 2020-09-27 MED ORDER — FENTANYL CITRATE (PF) 100 MCG/2ML IJ SOLN
INTRAMUSCULAR | Status: DC | PRN
  Administered 2020-09-27 (×2): 50 ug via INTRAVENOUS

## 2020-09-27 MED ORDER — ACETAMINOPHEN 500 MG PO TABS
ORAL_TABLET | ORAL | Status: AC
  Filled 2020-09-27: qty 2

## 2020-09-27 MED ORDER — DEXAMETHASONE SODIUM PHOSPHATE 4 MG/ML IJ SOLN
INTRAMUSCULAR | Status: DC | PRN
  Administered 2020-09-27: 5 mg via INTRAVENOUS

## 2020-09-27 MED ORDER — HYDROCODONE-ACETAMINOPHEN 5-325 MG PO TABS
1.0000 | ORAL_TABLET | Freq: Four times a day (QID) | ORAL | 0 refills | Status: AC | PRN
  Filled 2020-09-27: qty 10, 2d supply, fill #0

## 2020-09-27 MED ORDER — PROPOFOL 500 MG/50ML IV EMUL
INTRAVENOUS | Status: AC
  Filled 2020-09-27: qty 50

## 2020-09-27 MED ORDER — FENTANYL CITRATE (PF) 100 MCG/2ML IJ SOLN: 25.0000 ug | INTRAMUSCULAR | Status: DC | PRN

## 2020-09-27 SURGICAL SUPPLY — 30 items
BAG DRAIN URO-CYSTO SKYTR STRL (DRAIN) ×3 IMPLANT
BAG DRN UROCATH (DRAIN) ×1
BASKET ZERO TIP NITINOL 2.4FR (BASKET) ×2 IMPLANT
BSKT STON RTRVL ZERO TP 2.4FR (BASKET) ×1
CATH URET 5FR 28IN CONE TIP (BALLOONS)
CATH URET 5FR 28IN OPEN ENDED (CATHETERS) IMPLANT
CATH URET 5FR 70CM CONE TIP (BALLOONS) IMPLANT
CATH URET DUAL LUMEN 6-10FR 50 (CATHETERS) IMPLANT
CLOTH BEACON ORANGE TIMEOUT ST (SAFETY) ×3 IMPLANT
DRSG TEGADERM 2-3/8X2-3/4 SM (GAUZE/BANDAGES/DRESSINGS) ×2 IMPLANT
GLOVE SURG ENC MOIS LTX SZ7.5 (GLOVE) ×3 IMPLANT
GLOVE SURG ENC MOIS LTX SZ8 (GLOVE) IMPLANT
GOWN STRL REUS W/TWL LRG LVL3 (GOWN DISPOSABLE) ×3 IMPLANT
GUIDEWIRE ANG ZIPWIRE 038X150 (WIRE) ×2 IMPLANT
GUIDEWIRE STR DUAL SENSOR (WIRE) ×3 IMPLANT
GUIDEWIRE ZIPWRE .038 STRAIGHT (WIRE) IMPLANT
IV NS 500ML (IV SOLUTION) ×3
IV NS 500ML BAXH (IV SOLUTION) IMPLANT
IV NS IRRIG 3000ML ARTHROMATIC (IV SOLUTION) ×6 IMPLANT
KIT TURNOVER CYSTO (KITS) ×3 IMPLANT
MANIFOLD NEPTUNE II (INSTRUMENTS) ×3 IMPLANT
NS IRRIG 500ML POUR BTL (IV SOLUTION) ×3 IMPLANT
PACK CYSTO (CUSTOM PROCEDURE TRAY) ×3 IMPLANT
SHEATH URETERAL 12FRX35CM (MISCELLANEOUS) ×2 IMPLANT
STENT URET 6FRX26 CONTOUR (STENTS) ×2 IMPLANT
TRACTIP FLEXIVA PULS ID 200XHI (Laser) IMPLANT
TRACTIP FLEXIVA PULSE ID 200 (Laser) ×3
TUBE CONNECTING 12'X1/4 (SUCTIONS) ×1
TUBE CONNECTING 12X1/4 (SUCTIONS) ×2 IMPLANT
TUBING UROLOGY SET (TUBING) ×3 IMPLANT

## 2020-09-27 NOTE — Anesthesia Procedure Notes (Signed)
Procedure Name: LMA Insertion Date/Time: 09/27/2020 7:28 AM Performed by: Maryella Shivers, CRNA Pre-anesthesia Checklist: Patient identified, Emergency Drugs available, Suction available and Patient being monitored Patient Re-evaluated:Patient Re-evaluated prior to induction Oxygen Delivery Method: Circle system utilized Preoxygenation: Pre-oxygenation with 100% oxygen Induction Type: IV induction Ventilation: Mask ventilation without difficulty LMA: LMA inserted LMA Size: 5.0 Number of attempts: 1 Airway Equipment and Method: Bite block Placement Confirmation: positive ETCO2 Tube secured with: Tape Dental Injury: Teeth and Oropharynx as per pre-operative assessment

## 2020-09-27 NOTE — H&P (Signed)
H&P  Chief Complaint: right ureteral and renal stone  History of Present Illness: [name] is a 65 year old male who developed right flank pain.  CT scan of the abdomen and pelvis revealed a 5 mm right proximal stone with hydronephrosis and a 9 mm right lower pole stone.  His pain was poorly controlled and he also complained of foul-smelling urine and dysuria (which is chronic). A UA showed many bacteria greater than 50 white cells greater than 50 red cells.  He was admitted for pain control and bacteriuria.  White count was 11 and creatinine normal.  He has a remote history of stones.  Pain a little better this morning.  No stone passage.  Temp 99 and heart rate 110.   He complains of a long history of frequency and nocturia.  He has chronic urine odor and occasional dysuria.  He is status post cystoscopy right retrograde and right stent placement 08/26/2020.  Has had some stent pain with urgency.  Repeat culture in office 09/19/2020 was negative.  He has been on nightly nitrofurantoin.  No fevers.    Past Medical History:  Diagnosis Date   Arthritis    Depression    Dysuria    GERD (gastroesophageal reflux disease)    Glaucoma, both eyes    Hypertension    Renal calculus, right    Right ureteral stone    Urgency of urination    Wears glasses    Past Surgical History:  Procedure Laterality Date   COLONOSCOPY  11/2018   CYSTOSCOPY WITH STENT PLACEMENT Right 08/26/2020   Procedure: CYSTOSCOPY WITH STENT PLACEMENT;  Surgeon: Festus Aloe, MD;  Location: [facility];  Service: Urology;  Laterality: Right;   HARDWARE REMOVAL Left 07/11/2009   @ [facility]  left tibual nail and screw,  still has retained plate   IM NAILING TIBIA Left 05/09/2008   @[facility]    INGUINAL HERNIA REPAIR Left 12/21/1999   @[facility]    KNEE SURGERY Right    1970s   TOTAL KNEE ARTHROPLASTY Right 07/12/2017   Procedure: RIGHT TOTAL KNEE ARTHROPLASTY -CEMENTED;  Surgeon: Marybelle Killings, MD;  Location: [facility];  Service:  Orthopedics;  Laterality: Right;    Home Medications:  Medications Prior to Admission  Medication Sig Dispense Refill Last Dose   loratadine (CLARITIN) 10 MG tablet Take 10 mg by mouth daily.   09/27/2020 at 0330   metoprolol succinate (TOPROL-XL) 50 MG 24 hr tablet Take 50 mg by mouth daily.   09/27/2020 at 0330   omeprazole (PRILOSEC) 20 MG capsule Take 20 mg by mouth at bedtime.   09/26/2020   ondansetron (ZOFRAN) 4 MG tablet Take 1 tablet (4 mg total) by mouth every 6 (six) hours as needed for nausea. 20 tablet 0 09/27/2020 at 0330   timolol (TIMOPTIC) 0.5 % ophthalmic solution Place 1 drop into both eyes at bedtime.   Past Week   travoprost, benzalkonium, (TRAVATAN) 0.004 % ophthalmic solution Place 1 drop into both eyes at bedtime.   Past Week   Allergies: No Known Allergies  Family History  Problem Relation Age of Onset   Bladder Cancer Mother    Coronary artery disease Mother    Heart disease Mother    Stroke Father    Alcoholism Father    Social History:  reports that he quit smoking about 2 years ago. His smoking use included cigarettes. He has never used smokeless tobacco. He reports current alcohol use. He reports that he does not use drugs.  ROS:  A complete review of systems was performed.  All systems are negative except for pertinent findings as noted. Review of Systems  All other systems reviewed and are negative.   Physical Exam:  Vital signs in last 24 hours: Temp:  [97.8 F (36.6 C)] 97.8 F (36.6 C) (06/21 0554) Resp:  [75] 75 (06/21 0554) BP: (143)/(83) 143/83 (06/21 0554) SpO2:  [96 %] 96 % (06/21 0554) Weight:  [98.7 kg] 98.7 kg (06/21 5366) General:  Alert and oriented, No acute distress HEENT: Normocephalic, atraumatic Cardiovascular: Regular rate and rhythm Lungs: Regular rate and effort Abdomen: Soft, nontender, nondistended, no abdominal masses Back: No CVA tenderness Extremities: No edema Neurologic: Grossly intact  Laboratory Data:  Results  for orders placed or performed during the hospital encounter of 09/27/20 (from the past 24 hour(s))  I-STAT, chem 8     Status: Abnormal   Collection Time: 09/27/20  5:55 AM  Result Value Ref Range   Sodium 141 135 - 145 mmol/L   Potassium 4.1 3.5 - 5.1 mmol/L   Chloride 105 98 - 111 mmol/L   BUN 9 8 - 23 mg/dL   Creatinine, Ser 0.90 0.61 - 1.24 mg/dL   Glucose, Bld 140 (H) 70 - 99 mg/dL   Calcium, Ion 1.21 1.15 - 1.40 mmol/L   TCO2 24 22 - 32 mmol/L   Hemoglobin 15.3 13.0 - 17.0 g/dL   HCT 45.0 39.0 - 52.0 %   No results found for this or any previous visit (from the past 240 hour(s)). Creatinine: Recent Labs    09/27/20 0555  CREATININE 0.90    Impression/Assessment/plan:  Right proximal ureteral stone, right renal stone-I discussed with the patient the nature, potential benefits, risks and alternatives to cystoscopy, right ureteroscopy laser lithotripsy, right ureteral stent exchange, including side effects of the proposed treatment, the likelihood of the patient achieving the goals of the procedure, and any potential problems that might occur during the procedure or recuperation.  We discussed postop care and follow-up and stent removal.  We discussed he may need a staged procedure given the stone location and burden.  All questions answered. Patient elects to proceed.  Festus Aloe 09/27/2020, 7:19 AM

## 2020-09-27 NOTE — Anesthesia Postprocedure Evaluation (Signed)
Anesthesia Post Note  Patient: [name]  Procedure(s) Performed: CYSTOSCOPY/URETEROSCOPY/HOLMIUM LASER/STENT EXCHANGE (Right: Ureter)     Patient location during evaluation: PACU Anesthesia Type: General Level of consciousness: awake and alert Pain management: pain level controlled Vital Signs Assessment: post-procedure vital signs reviewed and stable Respiratory status: spontaneous breathing, nonlabored ventilation and respiratory function stable Cardiovascular status: blood pressure returned to baseline and stable Postop Assessment: no apparent nausea or vomiting Anesthetic complications: no   No notable events documented.  Last Vitals:  Vitals:   09/27/20 0930 09/27/20 1015  BP: 137/80 128/81  Pulse: 67 69  Resp: 14 16  Temp:  36.6 C  SpO2: 96% 96%    Last Pain:  Vitals:   09/27/20 1015  TempSrc:   PainSc: 0-No pain                 Catalina Gravel

## 2020-09-27 NOTE — Transfer of Care (Signed)
Immediate Anesthesia Transfer of Care Note  Patient: [name]  Procedure(s) Performed: CYSTOSCOPY/URETEROSCOPY/HOLMIUM LASER/STENT EXCHANGE (Right: Ureter)  Patient Location: PACU  Anesthesia Type:General  Level of Consciousness: sedated  Airway & Oxygen Therapy: Patient Spontanous Breathing and Patient connected to face mask oxygen  Post-op Assessment: Report given to RN and Post -op Vital signs reviewed and stable  Post vital signs: Reviewed and stable  Last Vitals:  Vitals Value Taken Time  BP 115/71 09/27/20 0853  Temp    Pulse 74 09/27/20 0856  Resp 12 09/27/20 0856  SpO2 96 % 09/27/20 0856  Vitals shown include unvalidated device data.  Last Pain:  Vitals:   09/27/20 0554  TempSrc: Oral  PainSc: 8       Patients Stated Pain Goal: 6 (27/03/50 0938)  Complications: No notable events documented.

## 2020-09-27 NOTE — Op Note (Signed)
Preoperative diagnosis: Right ureteral stone, right renal stone Postoperative diagnosis: Same  Procedure: Cystoscopy with right ureteroscopy laser lithotripsy, stone basket extraction, right stent exchange  Surgeon: Junious Silk  Anesthesia: General  Indication for procedure: [name] is a 65 year old male who presented with urinary tract infection and right proximal stone and underwent an urgent stent about a month ago.  He was brought today for definitive stone management.  He also had a stone in the right kidney.  Findings: On exam the penis, glans and meatus all appeared normal.  Scrotum appeared normal.  On cystoscopy the urethra was unremarkable although the fossa was quite tight on the 22 [place] scope.  Bladder was unremarkable apart from slight erythema at the inferior bladder neck and right trigone where the stent had been touching.  There were no papillary mucosal lesions.  On ureteroscopy the stone had slipped down into the distal proximal ureter close to the mid ureter and was impacted with some edema around which made access to the stone a bit tight.  I was able to fragment it and get it removed without difficulty.  His bladder neck and distal ureter were all fairly stiff which I noted at stent placement.  This prevented the semirigid scope really from getting into the proximal ureter.  The stone was quite dense.  Interestingly the right lower pole stone had progressed up into an upper right midpole posterior calyx where it was easier to fragment.  It was also quite dense.  All significant fragments were basketed.  Description of procedure: After consent was obtained patient brought to the operating room.  After adequate anesthesia he was placed lithotomy position and prepped and draped in the usual sterile fashion.  Timeout was performed to confirm the patient and procedure.  I had to put the obturator and the 22 [place] scope to get it through the fossa which was quite tight.  I then  replaced the 30 degree lens and passed the scope into the bladder.  The right ureteral stent was grasped and removed through the urethral meatus without difficulty.  The stent was not encrusted.  A sensor wire was advanced and coiled in the upper collecting system.  I then took a long semirigid scope to check the ureter but could not quite get it up out of the pelvis into the proximal mid ureter.  I could see if you centimeters ahead and no stone was noted.  Therefore I passed a zip wire under direct vision and backed the scope out.  I then passed the access sheath and did quit progressing at about the proximal to mid ureteral junction.  I then passed a dual channel digital ureteroscope and this is where the stone was encountered.  It did drop down a little bit lower than the CT.  Given the location and the turn of the ureter out of the pelvis up into the abdomen the access sheath was pitted and angled to the stone and the stone was slightly impacted with some erythema and edema of the ureter around the stone.  I took a 200 m laser fiber and just carefully chipped away at the edges of the stone at a setting of 0.3 and 50 and 0.6 and 20.  With the patient's and his pieces broke off the stone became more mobile and dropped into the lumen where it was fragmented.  A 0 tip basket was used to extract some very small pieces simply because they were getting into the sheath and limiting scope  movement.  Once I cleared all of the ureteral stone I was able to inspect the impaction site and it looked normal without any injury.  The proximal ureter was then inspected and noted to be normal.  I backed the scope out repassed the obturator and sensor wire and then advanced the access sheath up into the proximal ureter without difficulty I then repassed the flexible ureteroscope into the kidney the stone in the kidney had moved up into a superior midpole calyx and it was then fragmented into several large pieces.  The stone was  simply too hard to dust with this laser.  The pieces were then sequentially removed with the 0 tip basket.  Finally got down to 1 to 2 mm fragments that were difficult to basket and 1 or 2 of these were left behind but I think you be able to pass those.  Careful inspection of the collecting system noted there to be no other stones.  The access sheath was backed out on the ureteroscope and 1 more time I inspected the collecting system renal pelvis and ureter on the way out there was again was noted to be no other significant stone fragment or injury.  The wire was backloaded on the cystoscope and a 6 x 26 cm stent advanced.  The wire was removed with good coil seen in the kidney and a good coil in the bladder.  I left a string on the stent.  I injected some lidocaine jelly per urethra and he was awakened and taken the cover room in stable condition.  Complications: None  Blood loss: Minimal  Specimens: Stone fragments to patient  Drains: 6 x 26 cm right ureteral stent with string  Disposition: Patient stable to PACU

## 2020-09-27 NOTE — Discharge Instructions (Addendum)
Ureteral Stent Implantation, Care After This sheet gives you information about how to care for yourself after your procedure. Your health care provider may also give you more specific instructions. If you have problems or questions, contact your health careprovider.  Removal of the stent-pull the string on Friday morning, 09/30/2020 as instructed.  This will remove the entire stent.  What can I expect after the procedure? After the procedure, it is common to have: Nausea. Mild pain when you urinate. You may feel this pain in your lower back or lower abdomen. The pain should stop within a few minutes after you urinate. This may last for up to 1 week. A small amount of blood in your urine for several days. Follow these instructions at home: Medicines Take over-the-counter and prescription medicines only as told by your health care provider. If you were prescribed an antibiotic medicine, take it as told by your health care provider. Do not stop taking the antibiotic even if you start to feel better. Do not drive for 24 hours if you were given a sedative during your procedure. Ask your health care provider if the medicine prescribed to you requires you to avoid driving or using heavy machinery. Activity Rest as told by your health care provider. Avoid sitting for a long time without moving. Get up to take short walks every 1-2 hours. This is important to improve blood flow and breathing. Ask for help if you feel weak or unsteady. Return to your normal activities as told by your health care provider. Ask your health care provider what activities are safe for you. General instructions  Watch for any blood in your urine. Call your health care provider if the amount of blood in your urine increases. If you have a catheter: Follow instructions from your health care provider about taking care of your catheter and collection bag. Do not take baths, swim, or use a hot tub until your health care provider  approves. Ask your health care provider if you may take showers. You may only be allowed to take sponge baths. Drink enough fluid to keep your urine pale yellow. Do not use any products that contain nicotine or tobacco, such as cigarettes, e-cigarettes, and chewing tobacco. These can delay healing after surgery. If you need help quitting, ask your health care provider. Keep all follow-up visits as told by your health care provider. This is important.   Contact a health care provider if: You have pain that gets worse or does not get better with medicine, especially pain when you urinate. You have difficulty urinating. You feel nauseous or you vomit repeatedly during a period of more than 2 days after the procedure. Get help right away if: Your urine is dark red or has blood clots in it. You are leaking urine (have incontinence). The end of the stent comes out of your urethra. You cannot urinate. You have sudden, sharp, or severe pain in your abdomen or lower back. You have a fever. You have swelling or pain in your legs. You have difficulty breathing. Summary After the procedure, it is common to have mild pain when you urinate that goes away within a few minutes after you urinate. This may last for up to 1 week. Watch for any blood in your urine. Call your health care provider if the amount of blood in your urine increases. Take over-the-counter and prescription medicines only as told by your health care provider. Drink enough fluid to keep your urine pale yellow. This information  is not intended to replace advice given to you by your health care provider. Make sure you discuss any questions you have with your healthcare provider. Document Revised: 12/31/2017 Document Reviewed: 01/01/2018   CYSTOSCOPY HOME CARE INSTRUCTIONS  Activity: Rest for the remainder of the day.  Do not drive or operate equipment today.  You may resume normal activities in one to two days as instructed by your  physician.   Meals: Drink plenty of liquids and eat light foods such as gelatin or soup this evening.  You may return to a normal meal plan tomorrow.  Return to Work: You may return to work in one to two days or as instructed by your physician.  Special Instructions / Symptoms: Call your physician if any of these symptoms occur:   -persistent or heavy bleeding  -bleeding which continues after first few urination  -large blood clots that are difficult to pass  -urine stream diminishes or stops completely  -fever equal to or higher than 101 degrees Farenheit.  -cloudy urine with a strong, foul odor  -severe pain  Females should always wipe from front to back after elimination.  You may feel some burning pain when you urinate.  This should disappear with time.  Applying moist heat to the lower abdomen or a hot tub bath may help relieve the pain. \  Follow-Up / Date of Return Visit to Your Physician:  as instructed  Call for an appointment to arrange follow-up.   Post Anesthesia Home Care Instructions  Activity: Get plenty of rest for the remainder of the day. A responsible individual must stay with you for 24 hours following the procedure.  For the next 24 hours, DO NOT: -Drive a car -Paediatric nurse -Drink alcoholic beverages -Take any medication unless instructed by your physician -Make any legal decisions or sign important papers.  Meals: Start with liquid foods such as gelatin or soup. Progress to regular foods as tolerated. Avoid greasy, spicy, heavy foods. If nausea and/or vomiting occur, drink only clear liquids until the nausea and/or vomiting subsides. Call your physician if vomiting continues.  Special Instructions/Symptoms: Your throat may feel dry or sore from the anesthesia or the breathing tube placed in your throat during surgery. If this causes discomfort, gargle with warm salt water. The discomfort should disappear within 24 hours.  If you had a scopolamine  patch placed behind your ear for the management of post- operative nausea and/or vomiting:  1. The medication in the patch is effective for 72 hours, after which it should be removed.  Wrap patch in a tissue and discard in the trash. Wash hands thoroughly with soap and water. 2. You may remove the patch earlier than 72 hours if you experience unpleasant side effects which may include dry mouth, dizziness or visual disturbances. 3. Avoid touching the patch. Wash your hands with soap and water after contact with the patch.

## 2020-09-27 NOTE — Anesthesia Preprocedure Evaluation (Addendum)
Anesthesia Evaluation  Patient identified by MRN, date of birth, ID band Patient awake    Reviewed: Allergy & Precautions, NPO status , Patient's Chart, lab work & pertinent test results, reviewed documented beta blocker date and time   Airway Mallampati: III  TM Distance: >3 FB Neck ROM: Full    Dental  (+) Dental Advisory Given, Missing   Pulmonary neg pulmonary ROS, former smoker,    Pulmonary exam normal breath sounds clear to auscultation       Cardiovascular hypertension, Pt. on home beta blockers Normal cardiovascular exam Rhythm:Regular Rate:Normal     Neuro/Psych PSYCHIATRIC DISORDERS Depression negative neurological ROS     GI/Hepatic Neg liver ROS, GERD  Medicated,  Endo/Other  Obesity   Renal/GU Right ureteral stone     Musculoskeletal  (+) Arthritis ,   Abdominal   Peds  Hematology negative hematology ROS (+)   Anesthesia Other Findings Day of surgery medications reviewed with the patient.  Reproductive/Obstetrics                            Anesthesia Physical Anesthesia Plan  ASA: 2  Anesthesia Plan: General   Post-op Pain Management:    Induction: Intravenous  PONV Risk Score and Plan: 3 and Midazolam, Dexamethasone and Ondansetron  Airway Management Planned: LMA  Additional Equipment:   Intra-op Plan:   Post-operative Plan: Extubation in OR  Informed Consent: I have reviewed the patients History and Physical, chart, labs and discussed the procedure including the risks, benefits and alternatives for the proposed anesthesia with the patient or authorized representative who has indicated his/her understanding and acceptance.     Dental advisory given  Plan Discussed with: CRNA  Anesthesia Plan Comments:         Anesthesia Quick Evaluation

## 2020-09-28 ENCOUNTER — Encounter (HOSPITAL_BASED_OUTPATIENT_CLINIC_OR_DEPARTMENT_OTHER): Payer: Self-pay | Admitting: Urology

## 2021-12-07 ENCOUNTER — Other Ambulatory Visit: Payer: Self-pay | Admitting: Otolaryngology

## 2021-12-07 DIAGNOSIS — R221 Localized swelling, mass and lump, neck: Secondary | ICD-10-CM

## 2021-12-13 ENCOUNTER — Ambulatory Visit
Admission: RE | Admit: 2021-12-13 | Discharge: 2021-12-13 | Disposition: A | Discharge status: Home / Self Care | Payer: Medicare HMO | Source: Ambulatory Visit | Attending: Otolaryngology | Admitting: Otolaryngology

## 2021-12-13 DIAGNOSIS — R221 Localized swelling, mass and lump, neck: Secondary | ICD-10-CM

## 2021-12-13 MED ORDER — IOPAMIDOL (ISOVUE-300) INJECTION 61%
100.0000 mL | Freq: Once | INTRAVENOUS | Status: AC | PRN
  Administered 2021-12-13: 100 mL via INTRAVENOUS

## 2021-12-20 DIAGNOSIS — M489 Spondylopathy, unspecified: Secondary | ICD-10-CM | POA: Insufficient documentation

## 2021-12-20 DIAGNOSIS — I6523 Occlusion and stenosis of bilateral carotid arteries: Secondary | ICD-10-CM | POA: Insufficient documentation

## 2021-12-21 ENCOUNTER — Other Ambulatory Visit (HOSPITAL_COMMUNITY): Payer: Self-pay | Admitting: Otolaryngology

## 2021-12-21 ENCOUNTER — Ambulatory Visit (HOSPITAL_COMMUNITY)
Admission: RE | Admit: 2021-12-21 | Discharge: 2021-12-21 | Disposition: A | Discharge status: Home / Self Care | Payer: Medicare HMO | Source: Ambulatory Visit | Attending: Otolaryngology | Admitting: Otolaryngology

## 2021-12-21 DIAGNOSIS — I6523 Occlusion and stenosis of bilateral carotid arteries: Secondary | ICD-10-CM | POA: Diagnosis present

## 2021-12-21 NOTE — Progress Notes (Signed)
Carotid duplex has been completed.   Preliminary results in CV Proc.   [name] 12/21/2021 3:51 PM

## 2022-01-03 ENCOUNTER — Encounter: Payer: Self-pay | Admitting: Vascular Surgery

## 2022-01-03 ENCOUNTER — Ambulatory Visit: Payer: Medicare HMO | Admitting: Vascular Surgery

## 2022-01-03 VITALS — BP 135/85 | HR 76 | Temp 98.4°F | Resp 20 | Ht 65.0 in | Wt 217.3 lb

## 2022-01-03 DIAGNOSIS — I6521 Occlusion and stenosis of right carotid artery: Secondary | ICD-10-CM | POA: Diagnosis not present

## 2022-01-03 NOTE — Progress Notes (Signed)
Patient ID: [name], male   DOB: 06-Nov-1955, 66 y.o.   MRN: [id number]  Reason for Consult: New Patient (Initial Visit)   Referred by Chesley Noon, MD  Subjective:     HPI:  [name] is a 66 y.o. male without significant vascular disease.  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  He denies any history of stroke, TIA or amaurosis but says that he does have some stroke history within his family and heart disease as well.  Does not have any personal or family history of aneurysm disease.  He does not take any antiplatelet agents or blood thinners.  He has no complaints related to today's visit.  Past Medical History:  Diagnosis Date   Arthritis    Depression    Dysuria    GERD (gastroesophageal reflux disease)    Glaucoma, both eyes    Hypertension    Renal calculus, right    Right ureteral stone    Urgency of urination    Wears glasses    Family History  Problem Relation Age of Onset   Bladder Cancer Mother    Coronary artery disease Mother    Heart disease Mother    Stroke Father    Alcoholism Father    Past Surgical History:  Procedure Laterality Date   COLONOSCOPY  11/2018   CYSTOSCOPY WITH STENT PLACEMENT Right 08/26/2020   Procedure: CYSTOSCOPY WITH STENT PLACEMENT;  Surgeon: Festus Aloe, MD;  Location: [facility];  Service: Urology;  Laterality: Right;   CYSTOSCOPY/URETEROSCOPY/HOLMIUM LASER/STENT PLACEMENT Right 09/27/2020   Procedure: CYSTOSCOPY/URETEROSCOPY/HOLMIUM LASER/STENT EXCHANGE;  Surgeon: Festus Aloe, MD;  Location: [facility];  Service: Urology;  Laterality: Right;   HARDWARE REMOVAL Left 07/11/2009   @ [place]  left tibual nail and screw,  still has retained plate   IM NAILING TIBIA Left 05/09/2008   '@[facility]'$    INGUINAL HERNIA REPAIR Left 12/21/1999   '@[facility]    KNEE SURGERY Right    1970s   TOTAL KNEE ARTHROPLASTY Right  07/12/2017   Procedure: RIGHT TOTAL KNEE ARTHROPLASTY -CEMENTED;  Surgeon: Marybelle Killings, MD;  Location: [place] [facility];  Service: Orthopedics;  Laterality: Right;    Short Social History:  Social History   Tobacco Use   Smoking status: Former    Years: 30.00    Types: Cigarettes    Quit date: 2020    Years since quitting: 3.7   Smokeless tobacco: Never  Substance Use Topics   Alcohol use: Yes    Comment: occasional    No Known Allergies  Current Outpatient Medications  Medication Sig Dispense Refill   diclofenac (VOLTAREN) 75 MG EC tablet Take 75 mg by mouth 2 (two) times daily.     loratadine (CLARITIN) 10 MG tablet Take 10 mg by mouth daily.     meclizine (ANTIVERT) 25 MG tablet Take 25 mg by mouth 3 (three) times daily as needed.     metoprolol succinate (TOPROL-XL) 50 MG 24 hr tablet Take 50 mg by mouth daily.     omeprazole (PRILOSEC) 20 MG capsule Take 20 mg by mouth at bedtime.     ondansetron (ZOFRAN) 4 MG tablet Take 1 tablet (4 mg total) by mouth every 6 (six) hours as needed for nausea. 20 tablet 0   timolol (TIMOPTIC) 0.5 % ophthalmic solution Place 1 drop into both eyes at bedtime.     Travoprost, BAK Free, (  TRAVATAN) 0.004 % SOLN ophthalmic solution SMARTSIG:1 Drop(s) In Eye(s) Every Evening     No current facility-administered medications for this visit.    Review of Systems  Constitutional:  Constitutional negative. HENT: HENT negative.  Eyes: Eyes negative.  Respiratory: Respiratory negative.  Cardiovascular: Cardiovascular negative.  GI: Gastrointestinal negative.  Musculoskeletal: Musculoskeletal negative.  Skin: Skin negative.  Neurological: Neurological negative. Hematologic: Hematologic/lymphatic negative.  Psychiatric: Psychiatric negative.        Objective:  Objective  Vitals:   01/03/22 1430  BP: 135/85  Pulse: 76  Resp: 20  Temp: 98.4 F (36.9 C)  SpO2: 93%     Physical Exam HENT:     Head: Normocephalic.     Nose: Nose normal.   Eyes:     Pupils: Pupils are equal, round, and reactive to light.  Neck:     Vascular: No carotid bruit.     Comments: Palpable right neck mass Cardiovascular:     Rate and Rhythm: Normal rate.  Pulmonary:     Effort: Pulmonary effort is normal.  Abdominal:     General: Abdomen is flat.     Palpations: Abdomen is soft.  Musculoskeletal:        General: Normal range of motion.     Cervical back: Neck supple.     Right lower leg: No edema.     Left lower leg: No edema.  Skin:    General: Skin is warm and dry.     Capillary Refill: Capillary refill takes less than 2 seconds.  Neurological:     General: No focal deficit present.     Mental Status: He is alert.  Psychiatric:        Mood and Affect: Mood normal.     Data: Right Carotid Findings:  +----------+--------+--------+--------+------------------+--------+            PSV cm/sEDV cm/sStenosisPlaque DescriptionComments  +----------+--------+--------+--------+------------------+--------+  CCA Prox  94      18              heterogenous                +----------+--------+--------+--------+------------------+--------+  CCA Distal91      20              heterogenous                +----------+--------+--------+--------+------------------+--------+  ICA Prox  101     32      1-39%   heterogenous                +----------+--------+--------+--------+------------------+--------+  ICA Distal109     30                                          +----------+--------+--------+--------+------------------+--------+  ECA       138     14                                          +----------+--------+--------+--------+------------------+--------+   +----------+--------+-------+--------+-------------------+            PSV cm/sEDV cmsDescribeArm Pressure (mmHG)  +----------+--------+-------+--------+-------------------+  [id number]                                          +----------+--------+-------+--------+-------------------+   +---------+--------+--+--------+-+---------+  VertebralPSV cm/s36EDV cm/s9Antegrade  +---------+--------+--+--------+-+---------+       Left Carotid Findings:  +----------+--------+--------+--------+------------------+--------+            PSV cm/sEDV cm/sStenosisPlaque DescriptionComments  +----------+--------+--------+--------+------------------+--------+  CCA Prox  110     22              heterogenous                +----------+--------+--------+--------+------------------+--------+  CCA Distal99      19              heterogenous                +----------+--------+--------+--------+------------------+--------+  ICA Prox  86      26      1-39%   heterogenous                +----------+--------+--------+--------+------------------+--------+  ICA Distal104     26                                          +----------+--------+--------+--------+------------------+--------+  ECA       128     11                                          +----------+--------+--------+--------+------------------+--------+   +----------+--------+--------+--------+-------------------+            PSV cm/sEDV cm/sDescribeArm Pressure (mmHG)  +----------+--------+--------+--------+-------------------+  Subclavian117                                          +----------+--------+--------+--------+-------------------+   +---------+--------+--+--------+-+---------+  VertebralPSV cm/s42EDV cm/s7Antegrade  +---------+--------+--+--------+-+---------+           Summary:  Right Carotid: Velocities in the right ICA are consistent with a 1-39%  stenosis.   Left Carotid: Velocities in the left ICA are consistent with a 1-39%  stenosis.   Vertebrals: Bilateral vertebral arteries demonstrate antegrade flow.      Assessment/Plan:    66 year old male scheduled for right parotid tumor  excision noted to have calcifications in his right carotid artery but without significant stenosis.  He was standpoint I have discussed with him to take baby aspirin after he is completely healed from his parotid tumor surgery and can follow-up with [place] in 3 years with repeat carotid duplex.  All questions were answered.    Waynetta Sandy MD [facility]

## 2022-02-23 ENCOUNTER — Other Ambulatory Visit: Payer: Self-pay | Admitting: Otolaryngology

## 2022-02-23 NOTE — Pre-Procedure Instructions (Signed)
Surgical Instructions    Your procedure is scheduled on Friday November 8.  Report to [facility] Main Entrance "A" at 7:00 A.M., then check in with the Admitting office.  Call this number if you have problems the morning of surgery:  [phone]  If you have any questions prior to your surgery date call [phone]: Open Monday-Friday 8am-4pm  If you experience any cold, Covid, or flu symptoms such as cough, fever, chills, shortness of breath, etc. between now and your scheduled surgery, please notify us at the above number   Patient Instructions   The night before surgery:    No food after midnight. ONLY clear liquids after midnight   The day of surgery:    You may drink clear liquids until 6:00 the morning of your surgery.   Clear liquids allowed are: Water, Non-Citrus Juices (without pulp), Carbonated Beverages, Clear Tea, Black Coffee ONLY (NO MILK, CREAM OR POWDERED CREAMER of any kind), and Gatorade  If you have questions, please contact your surgeon's office.    Take these medications the morning of surgery with A SIP OF WATER:  loratadine (CLARITIN)  metoprolol succinate (TOPROL-XL)   You may take these medications with A SIP OF WATER IF YOU NEED THEM: meclizine (ANTIVERT)  ondansetron (ZOFRAN)   As of today, STOP taking any Aspirin (unless otherwise instructed by your surgeon) Aleve, Naproxen, Ibuprofen, Motrin, Advil, Goody's, BC's, all herbal medications, fish oil, and all vitamins. THIS INCLUDES diclofenac (VOLTAREN)           Do NOT Smoke (Tobacco/Vaping)  24 hours prior to your procedure  If you use a CPAP at night, you may bring your mask for your overnight stay.   Contacts, glasses, hearing aids, dentures or partials may not be worn into surgery, please bring cases for these belongings   For patients admitted to the hospital, discharge time will be determined by your treatment team.   Patients discharged the day of surgery will not be allowed to drive  home, and someone needs to stay with them for 24 hours.   SURGICAL WAITING ROOM VISITATION Patients having surgery or a procedure may have no more than 2 support people in the waiting area - these visitors may rotate.   Children under the age of 69 must have an adult with them who is not the patient. If the patient needs to stay at the hospital during part of their recovery, the visitor guidelines for inpatient rooms apply. Pre-op nurse will coordinate an appropriate time for 1 support person to accompany patient in pre-op.  This support person may not rotate.   Please refer to the [facility] website for the visitor guidelines for Inpatients (after your surgery is over and you are in a regular room).    Special instructions:    Oral Hygiene is also important to reduce your risk of infection.  Remember -  BRUSH YOUR TEETH THE MORNING OF SURGERY WITH YOUR REGULAR TOOTHPASTE   [place]- Preparing For Surgery  Before surgery, you can play an important role. Because skin is not sterile, your skin needs to be as free of germs as possible. You can reduce the number of germs on your skin by washing with CHG (chlorahexidine gluconate) Soap before surgery.  CHG is an antiseptic cleaner which kills germs and bonds with the skin to continue killing germs even after washing.     Please do not use if you have an allergy to CHG or antibacterial soaps. If your skin becomes  reddened/irritated stop using the CHG.  Do not shave (including legs and underarms) for at least 48 hours prior to first CHG shower. It is OK to shave your face.  Please follow these instructions carefully.    Shower the NIGHT BEFORE SURGERY and the MORNING OF SURGERY with CHG Soap.  If you chose to wash your hair, wash your hair first as usual with your normal shampoo.  After you shampoo, rinse your hair and body thoroughly to remove the shampoo.   Then [employer] and genitals (private parts) with your normal soap and rinse  thoroughly to remove soap.  After that Use CHG Soap as you would any other liquid soap.  You can apply CHG directly to the skin and wash gently with a scrungie or a clean washcloth.   Apply the CHG Soap to your body ONLY FROM THE NECK DOWN.   Do not use on open wounds or open sores.  Avoid contact with your eyes, ears, mouth and genitals (private parts).  Wash thoroughly, paying special attention to the area where your surgery will be performed.  Thoroughly rinse your body with warm water from the neck down.  DO NOT shower/wash with your normal soap after using and rinsing off the CHG Soap.  Pat yourself dry with a CLEAN TOWEL.  Wear CLEAN PAJAMAS to bed the night before surgery  Place CLEAN SHEETS on your bed the night before your surgery  DO NOT SLEEP WITH PETS.   Day of Surgery:  Take a shower with CHG soap. Wear Clean/Comfortable clothing the morning of surgery Brush your teeth WITH YOUR REGULAR TOOTHPASTE. Do not wear jewelry. Do not wear lotions, powders, cologne or deodorant. Men may shave face and neck. Do not bring valuables to the hospital.  [facility] is not responsible for any belongings or valuables.   If you received a COVID test during your pre-op visit, it is requested that you wear a mask when out in public, stay away from anyone that may not be feeling well, and notify your surgeon if you develop symptoms. If you have been in contact with anyone that has tested positive in the last 10 days, please notify your surgeon.    Please read over the following fact sheets that you were given.

## 2022-02-26 ENCOUNTER — Encounter (HOSPITAL_COMMUNITY): Payer: Self-pay

## 2022-02-26 ENCOUNTER — Other Ambulatory Visit: Payer: Self-pay

## 2022-02-26 ENCOUNTER — Encounter (HOSPITAL_COMMUNITY)
Admission: RE | Admit: 2022-02-26 | Discharge: 2022-02-26 | Disposition: A | Discharge status: Home / Self Care | Payer: Medicare HMO | Source: Ambulatory Visit | Attending: Anesthesiology | Admitting: Anesthesiology

## 2022-02-26 VITALS — BP 158/91 | HR 71 | Temp 97.9°F | Resp 18 | Ht 65.0 in | Wt 217.0 lb

## 2022-02-26 DIAGNOSIS — Z01818 Encounter for other preprocedural examination: Secondary | ICD-10-CM | POA: Insufficient documentation

## 2022-02-26 DIAGNOSIS — I10 Essential (primary) hypertension: Secondary | ICD-10-CM | POA: Insufficient documentation

## 2022-02-26 HISTORY — DX: Personal history of urinary calculi: Z87.442

## 2022-02-26 LAB — CBC
HCT: 47.3 % (ref 39.0–52.0)
Hemoglobin: 16.4 g/dL (ref 13.0–17.0)
MCH: 31.6 pg (ref 26.0–34.0)
MCHC: 34.7 g/dL (ref 30.0–36.0)
MCV: 91.1 fL (ref 80.0–100.0)
Platelets: 313 10*3/uL (ref 150–400)
RBC: 5.19 MIL/uL (ref 4.22–5.81)
RDW: 12.6 % (ref 11.5–15.5)
WBC: 8.5 10*3/uL (ref 4.0–10.5)
nRBC: 0 % (ref 0.0–0.2)

## 2022-02-26 LAB — BASIC METABOLIC PANEL
Anion gap: 12 (ref 5–15)
BUN: 11 mg/dL (ref 8–23)
CO2: 21 mmol/L — ABNORMAL LOW (ref 22–32)
Calcium: 9.3 mg/dL (ref 8.9–10.3)
Chloride: 104 mmol/L (ref 98–111)
Creatinine, Ser: 0.85 mg/dL (ref 0.61–1.24)
GFR, Estimated: >60 mL/min (ref >60)
Glucose, Bld: 98 mg/dL (ref 70–99)
Potassium: 4.4 mmol/L (ref 3.5–5.1)
Sodium: 137 mmol/L (ref 135–145)

## 2022-02-26 NOTE — Progress Notes (Signed)
   02/26/22 1310  OBSTRUCTIVE SLEEP APNEA  Have you ever been diagnosed with sleep apnea through a sleep study? No  Do you snore loudly (loud enough to be heard through closed doors)?  0  Do you often feel tired, fatigued, or sleepy during the daytime (such as falling asleep during driving or talking to someone)? 0  Has anyone observed you stop breathing during your sleep? 0  Do you have, or are you being treated for high blood pressure? 1  BMI more than 35 kg/m2? 1  Age > 50 (1-yes) 1  Neck circumference greater than:Male 16 inches or larger, Male 17inches or larger? 1  Male Gender (Yes=1) 1  Obstructive Sleep Apnea Score 5  Score 5 or greater  Results sent to PCP

## 2022-02-26 NOTE — Progress Notes (Signed)
PCP - Dr. Anastasia Pall Cardiologist - denies  PPM/ICD - denies   Chest x-ray - 07/03/17 EKG - 02/26/22 Stress Test - denies ECHO - denies Cardiac Cath - denies  Sleep Study - denies   DM- denies  Last dose of GLP1 agonist-  n/a   ASA/Blood Thinner Instructions: n/a   ERAS Protcol - yes, no drink   COVID TEST- n/a   Anesthesia review: no  Patient denies shortness of breath, fever, cough and chest pain at PAT appointment   All instructions explained to the patient, with a verbal understanding of the material. Patient agrees to go over the instructions while at home for a better understanding.  The opportunity to ask questions was provided.

## 2022-02-27 ENCOUNTER — Other Ambulatory Visit: Payer: Self-pay | Admitting: Otolaryngology

## 2022-03-15 NOTE — H&P (Signed)
[name] is an 66 y.o. male.    Chief Complaint:  Right parotid tumor  HPI: Patient presents today for planned elective procedure.  He/she denies any interval change in history since office visit on 12/20/21.  Past Medical History:  Diagnosis Date   Arthritis    Depression    Dysuria    GERD (gastroesophageal reflux disease)    Glaucoma, both eyes    History of kidney stones    Hypertension    Right ureteral stone    Urgency of urination    Wears glasses     Past Surgical History:  Procedure Laterality Date   COLONOSCOPY  11/2018   CYSTOSCOPY WITH STENT PLACEMENT Right 08/26/2020   Procedure: CYSTOSCOPY WITH STENT PLACEMENT;  Surgeon: Festus Aloe, MD;  Location: [facility];  Service: Urology;  Laterality: Right;   CYSTOSCOPY/URETEROSCOPY/HOLMIUM LASER/STENT PLACEMENT Right 09/27/2020   Procedure: CYSTOSCOPY/URETEROSCOPY/HOLMIUM LASER/STENT EXCHANGE;  Surgeon: Festus Aloe, MD;  Location: [facility];  Service: Urology;  Laterality: Right;   HARDWARE REMOVAL Left 07/11/2009   @ [place]  left tibual nail and screw,  still has retained plate   IM NAILING TIBIA Left 05/09/2008   '@[facility]'$    INGUINAL HERNIA REPAIR Left 12/21/1999   '@[facility]'$    KNEE SURGERY Right    1970s   NOSE SURGERY     repaired skin after injury   TOTAL KNEE ARTHROPLASTY Right 07/12/2017   Procedure: RIGHT TOTAL KNEE ARTHROPLASTY -CEMENTED;  Surgeon: Marybelle Killings, MD;  Location: [place];  Service: Orthopedics;  Laterality: Right;    Family History  Problem Relation Age of Onset   Bladder Cancer Mother    Coronary artery disease Mother    Heart disease Mother    Stroke Father    Alcoholism Father     Social History:  reports that he quit smoking about 22 years ago. His smoking use included cigarettes. He has never used smokeless tobacco. He reports current alcohol use. He reports that he does not use drugs.  Allergies: No Known Allergies  Medications Prior to Admission  Medication  Sig Dispense Refill   loratadine (CLARITIN) 10 MG tablet Take 10 mg by mouth daily.     metoprolol succinate (TOPROL-XL) 50 MG 24 hr tablet Take 50 mg by mouth daily.     omeprazole (PRILOSEC) 20 MG capsule Take 20 mg by mouth at bedtime.     ondansetron (ZOFRAN) 4 MG tablet Take 1 tablet (4 mg total) by mouth every 6 (six) hours as needed for nausea. 20 tablet 0   timolol (TIMOPTIC) 0.5 % ophthalmic solution Place 1 drop into both eyes at bedtime.     Travoprost, BAK Free, (TRAVATAN) 0.004 % SOLN ophthalmic solution Place 1 drop into both eyes at bedtime.     diclofenac (VOLTAREN) 75 MG EC tablet Take 75 mg by mouth 2 (two) times daily.     meclizine (ANTIVERT) 25 MG tablet Take 25 mg by mouth 3 (three) times daily as needed for dizziness.      No results found for this or any previous visit (from the past 48 hour(s)). No results found.  ROS: negative other than stated in HPI  Blood pressure 137/77, pulse 82, temperature 98.5 F (36.9 C), temperature source Oral, resp. rate 18, height '5\' 3"'$  (1.6 m), weight 98 kg.  PHYSICAL EXAM: General: Resting comfortably in NAD  Lungs: Non-labored respiratinos  Studies Reviewed:  Specimen A.  Final Interpretation   A.  RIGHT PAROTID GLAND FINE NEEDLE  ASPIRATION II (SMEARS AND THINPRE):              No high grade epithelial malignancy is seen. See comment.     Electronically signed by Lowella Bandy, MD on 12/24/2021 at  5:36 PM   CT neck with contrast 009/06/23 IMPRESSION: 1. 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 No specific feature to imply the underlying histology. 2. 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 3. Carotid atherosclerosis.     Electronically Signed   By: Jorje Guild M.D.   On: 12/13/2021 12:51    Assessment/Plan Right Parotid tumor  Proceed with right parotidectomy. Informed consent obtained. R/B/A discussed including risks of  pain,  bleeding, infection, scarring, numbness, Frey's syndrome, first bite syndrome, recurrent tumor, facial nerve weakness/paralysis, ear numbness, hematoma/seroma/abscess, need for further surgery, risks of anesthesia. Despite these risks the patient requested to proceed with surgery.  Overnight admission to ward for drain care.   Electronically signed by:  Jenetta Downer, MD  Staff Physician Facial Plastic & Reconstructive Surgery Otolaryngology - Head and Neck Surgery [facility] [place]  03/16/2022, 7:24 AM

## 2022-03-15 NOTE — Discharge Instructions (Signed)
[name] [name] MD  HOME CARE INSTRUCTIONS FOLLOWING PAROTID GLAND SURGERY:  DIET:  - Patients who have received general anesthesia may experience nausea and occasionally, vomiting. It is therefore preferable to eat a bland light meal or a liquid diet on the first day after the surgery. Regular diet may be resumed the next day. Also, pain pills may cause nausea if taken on an empty stomach. It is preferable to take those pills with a piece of toast or some food. It is important to maintain hydration by drinking lots of fluids after the procedure.   ACTIVITY AND WOUND CARE:  - Elevate the head as much as possible. Sit in a recliner or use two or three pillows when sleeping in bed. Head elevation reduces bruising and swelling. Occasionally, you may notice that the bruises or swelling have migrated to other places (usually lower regions). You may have a dressing or your wound may be exposed.   - If your wound is not covered with a dressing keep the exposed wound dry. Avoid showers for the first 48 hours. After this you may shower, but avoid shower water directly hitting the surgical wound. If you do get the wound wet, pat dry the area immediately after your shower. If you have been instructed to use a topical antibiotic and have not received a prescription for antibiotic ointment, use over-the-counter triple antibiotic such as Neosporin. Apply a scanty amount on the suture line. At times, you may not see the sutures because they have been placed inside the wound.  MEDICATIONS:  - It is important to take all medications as prescribed by your surgeon. An antibiotic may be prescribed following the surgery. The patient also receives a prescription for narcotic pain killers. These products cause somnolence, drowsiness and constipation. Patients who take painkillers should not operate machinery, drive or make important decisions. - Do not use aspirin for 2 weeks; it increases the possibility of  bleeding.  WHAT TO EXPECT:  - For the first one to two weeks after surgery, there will be a mild to moderate amount of pain in the neck, side of the face, jaw, ear, or throat. This can be controlled with prescription pain medicine received at hospital discharge or Tylenol (acetaminophen). Ice may be applied to the affected area to help reduce pain and swelling. - You may have numbness, tingling and pain around the surgical site. This may last a long time but will become less noticeable with time.  - There may be discomfort during eating, and swallowing hard, crunchy foods may cause increased discomfort in the involved region. In time, this discomfort will improve. - Swelling in the surgical site is normal and will peak in 2-3 days. Call our office if the incision swells excessively, becomes increasingly red and or starts to drain. - If you experience dry eyes on the operative side, it is ok to use natural lubricating tears to keep the eye well moistened.   RESTRICTIONS:  - Bed rest and very light activity is the rule for the first 24 hours postoperatively. You may increase your activity level as necessary, but use common sense. - Refrain from vigorous activity for the 10 days after surgery. Do not lift objects weighing over 8-10 pounds (roughly the weight of a gallon of milk) for a minimum of 2 weeks following surgery.  - After surgery, you may shower below the neck. Avoid showering above the neck until at least 48 hours after surgery. We strongly discourage soaking the incision in   the bathtub, swimming pool, or hot tub until you have discussed this with your physician. - Driving is prohibited while taking prescription pain medicine. Additionally patients should not drive until for a minimum of 2 weeks (or as instructed by your physician) following surgery of the head and neck region. Surgery in this area may cause decreased range of motion making driving unsafe. Do not drive if you are unable to  look behind your shoulder comfortably.  WHAT ARE SOME REASONS TO CONTACT YOUR DOCTOR AFTER SURGERY?  - After parotid surgery there is a small risk for temporary or even permanent facial paralysis on the operative side. If you notice any increase in facial asymmetry after discharge, particularly with closing your eyes fully, or with an abnormal droop of the side of your mouth, please contact our office. - Some swelling around the incision is normal. However, if is any sudden significant increase in neck swelling, apply an ice pack to the neck and call our office. If this occurs after regular business hours, please call  [phone] and ask for the on call ENT resident. - If you develop and shortness of breath or difficulty, breathing please present to the nearest emergency department emergently for evaluation. - Many people will experience low-grade fevers after surgery that may persist for one to two days. Low grade fevers (less than 101 F) usually will resolve with Tylenol and fluids. If you have a high fever (greater than 101 F) that lasts longer than 24 hours without any improvement, please notify our service. - At any time during the postop period, please call the office if you have any questions or concerns about excessive bleeding, breathing difficulty, pain, persistent fever, nausea, swelling or other concerns that seem out of the ordinary from what you have discussed with your surgeon or read in this handout.  [facility] [place] [address] [place], [place] [address]  Phone: [phone]    

## 2022-03-15 NOTE — Progress Notes (Signed)
Pt made aware of surgery time change 0730-1100, arrival 0530, and finish clear liquids by 0430.

## 2022-03-16 ENCOUNTER — Ambulatory Visit (HOSPITAL_BASED_OUTPATIENT_CLINIC_OR_DEPARTMENT_OTHER): Payer: Medicare HMO | Admitting: General Practice

## 2022-03-16 ENCOUNTER — Ambulatory Visit (HOSPITAL_COMMUNITY): Payer: Medicare HMO | Admitting: General Practice

## 2022-03-16 ENCOUNTER — Encounter (HOSPITAL_COMMUNITY): Payer: Self-pay | Admitting: Otolaryngology

## 2022-03-16 ENCOUNTER — Other Ambulatory Visit: Payer: Self-pay

## 2022-03-16 ENCOUNTER — Observation Stay (HOSPITAL_COMMUNITY)
Admission: RE | Admit: 2022-03-16 | Discharge: 2022-03-17 | Disposition: A | Discharge status: Home / Self Care | Payer: Medicare HMO | Attending: Otolaryngology | Admitting: Otolaryngology

## 2022-03-16 ENCOUNTER — Encounter (HOSPITAL_COMMUNITY)
Admission: RE | Disposition: A | Discharge status: Home / Self Care | Payer: Self-pay | Source: Home / Self Care | Attending: Otolaryngology

## 2022-03-16 DIAGNOSIS — K118 Other diseases of salivary glands: Secondary | ICD-10-CM

## 2022-03-16 DIAGNOSIS — I10 Essential (primary) hypertension: Secondary | ICD-10-CM | POA: Diagnosis not present

## 2022-03-16 DIAGNOSIS — D490 Neoplasm of unspecified behavior of digestive system: Secondary | ICD-10-CM | POA: Diagnosis present

## 2022-03-16 DIAGNOSIS — Z96651 Presence of right artificial knee joint: Secondary | ICD-10-CM | POA: Diagnosis not present

## 2022-03-16 DIAGNOSIS — Z87891 Personal history of nicotine dependence: Secondary | ICD-10-CM | POA: Insufficient documentation

## 2022-03-16 DIAGNOSIS — Z79899 Other long term (current) drug therapy: Secondary | ICD-10-CM | POA: Diagnosis not present

## 2022-03-16 DIAGNOSIS — R221 Localized swelling, mass and lump, neck: Principal | ICD-10-CM | POA: Insufficient documentation

## 2022-03-16 HISTORY — PX: PAROTIDECTOMY: SHX2163

## 2022-03-16 SURGERY — EXCISION, PAROTID GLAND
Anesthesia: General | Laterality: Right

## 2022-03-16 MED ORDER — METOPROLOL SUCCINATE ER 25 MG PO TB24
50.0000 mg | ORAL_TABLET | Freq: Every day | ORAL | Status: DC
  Administered 2022-03-17: 50 mg via ORAL
  Filled 2022-03-16 (×2): qty 2

## 2022-03-16 MED ORDER — ORAL CARE MOUTH RINSE: 15.0000 mL | Freq: Once | OROMUCOSAL | Status: AC

## 2022-03-16 MED ORDER — ONDANSETRON HCL 4 MG/2ML IJ SOLN: 4.0000 mg | Freq: Four times a day (QID) | INTRAMUSCULAR | Status: DC | PRN

## 2022-03-16 MED ORDER — PROPOFOL 10 MG/ML IV BOLUS
INTRAVENOUS | Status: AC
  Filled 2022-03-16: qty 20

## 2022-03-16 MED ORDER — PHENYLEPHRINE 80 MCG/ML (10ML) SYRINGE FOR IV PUSH (FOR BLOOD PRESSURE SUPPORT)
PREFILLED_SYRINGE | INTRAVENOUS | Status: DC | PRN
  Administered 2022-03-16 (×4): 80 ug via INTRAVENOUS

## 2022-03-16 MED ORDER — MIDAZOLAM HCL 2 MG/2ML IJ SOLN
INTRAMUSCULAR | Status: AC
  Filled 2022-03-16: qty 2

## 2022-03-16 MED ORDER — EPINEPHRINE HCL (NASAL) 0.1 % NA SOLN
NASAL | Status: DC | PRN
  Administered 2022-03-16: 10 mL via TOPICAL

## 2022-03-16 MED ORDER — SUCCINYLCHOLINE CHLORIDE 200 MG/10ML IV SOSY
PREFILLED_SYRINGE | INTRAVENOUS | Status: DC | PRN
  Administered 2022-03-16: 130 mg via INTRAVENOUS

## 2022-03-16 MED ORDER — MIDAZOLAM HCL 2 MG/2ML IJ SOLN
INTRAMUSCULAR | Status: DC | PRN
  Administered 2022-03-16: 1 mg via INTRAVENOUS

## 2022-03-16 MED ORDER — BACITRACIN ZINC 500 UNIT/GM EX OINT
TOPICAL_OINTMENT | CUTANEOUS | Status: AC
  Filled 2022-03-16: qty 28.35

## 2022-03-16 MED ORDER — ACETAMINOPHEN 160 MG/5ML PO SOLN: 1000.0000 mg | Freq: Once | ORAL | Status: DC | PRN

## 2022-03-16 MED ORDER — DEXAMETHASONE SODIUM PHOSPHATE 10 MG/ML IJ SOLN
INTRAMUSCULAR | Status: DC | PRN
  Administered 2022-03-16: 10 mg via INTRAVENOUS

## 2022-03-16 MED ORDER — TIMOLOL MALEATE 0.5 % OP SOLN
1.0000 [drp] | Freq: Every day | OPHTHALMIC | Status: DC
  Administered 2022-03-16: 1 [drp] via OPHTHALMIC
  Filled 2022-03-16: qty 5

## 2022-03-16 MED ORDER — EPINEPHRINE HCL (NASAL) 0.1 % NA SOLN
NASAL | Status: AC
  Filled 2022-03-16: qty 30

## 2022-03-16 MED ORDER — DEXTROSE-NACL 5-0.45 % IV SOLN: INTRAVENOUS | Status: DC

## 2022-03-16 MED ORDER — PHENYLEPHRINE 80 MCG/ML (10ML) SYRINGE FOR IV PUSH (FOR BLOOD PRESSURE SUPPORT)
PREFILLED_SYRINGE | INTRAVENOUS | Status: AC
  Filled 2022-03-16: qty 10

## 2022-03-16 MED ORDER — FENTANYL CITRATE (PF) 250 MCG/5ML IJ SOLN
INTRAMUSCULAR | Status: AC
  Filled 2022-03-16: qty 5

## 2022-03-16 MED ORDER — OXYCODONE HCL 5 MG PO TABS: 5.0000 mg | ORAL_TABLET | Freq: Once | ORAL | Status: DC | PRN

## 2022-03-16 MED ORDER — FENTANYL CITRATE (PF) 100 MCG/2ML IJ SOLN: 25.0000 ug | INTRAMUSCULAR | Status: DC | PRN

## 2022-03-16 MED ORDER — PROPOFOL 10 MG/ML IV BOLUS
INTRAVENOUS | Status: DC | PRN
  Administered 2022-03-16: 10 mg via INTRAVENOUS
  Administered 2022-03-16: 120 mg via INTRAVENOUS

## 2022-03-16 MED ORDER — ACETAMINOPHEN 500 MG PO TABS: 1000.0000 mg | ORAL_TABLET | Freq: Once | ORAL | Status: DC | PRN

## 2022-03-16 MED ORDER — ACETAMINOPHEN 500 MG PO TABS: 500.0000 mg | ORAL_TABLET | Freq: Four times a day (QID) | ORAL | 1 refills | Status: AC | PRN

## 2022-03-16 MED ORDER — LACTATED RINGERS IV SOLN: INTRAVENOUS | Status: DC

## 2022-03-16 MED ORDER — LIDOCAINE-EPINEPHRINE 1 %-1:100000 IJ SOLN
INTRAMUSCULAR | Status: AC
  Filled 2022-03-16: qty 1

## 2022-03-16 MED ORDER — IBUPROFEN 600 MG PO TABS: 600.0000 mg | ORAL_TABLET | Freq: Four times a day (QID) | ORAL | Status: DC | PRN

## 2022-03-16 MED ORDER — ACETAMINOPHEN 10 MG/ML IV SOLN: 1000.0000 mg | Freq: Once | INTRAVENOUS | Status: DC | PRN

## 2022-03-16 MED ORDER — BACITRACIN ZINC 500 UNIT/GM EX OINT
1.0000 | TOPICAL_OINTMENT | Freq: Three times a day (TID) | CUTANEOUS | Status: DC
  Administered 2022-03-16 – 2022-03-17 (×3): 1 via TOPICAL
  Filled 2022-03-16 (×2): qty 28.35

## 2022-03-16 MED ORDER — ACETAMINOPHEN 325 MG PO TABS
650.0000 mg | ORAL_TABLET | Freq: Four times a day (QID) | ORAL | Status: DC | PRN
  Administered 2022-03-17: 650 mg via ORAL
  Filled 2022-03-16: qty 2

## 2022-03-16 MED ORDER — OXYCODONE HCL 5 MG PO TABS: 5.0000 mg | ORAL_TABLET | Freq: Four times a day (QID) | ORAL | Status: DC | PRN

## 2022-03-16 MED ORDER — PHENYLEPHRINE HCL-NACL 20-0.9 MG/250ML-% IV SOLN
INTRAVENOUS | Status: DC | PRN
  Administered 2022-03-16: 25 ug/min via INTRAVENOUS

## 2022-03-16 MED ORDER — FENTANYL CITRATE (PF) 250 MCG/5ML IJ SOLN
INTRAMUSCULAR | Status: DC | PRN
  Administered 2022-03-16 (×3): 50 ug via INTRAVENOUS
  Administered 2022-03-16: 100 ug via INTRAVENOUS
  Administered 2022-03-16 (×4): 50 ug via INTRAVENOUS

## 2022-03-16 MED ORDER — DOCUSATE SODIUM 100 MG PO CAPS
100.0000 mg | ORAL_CAPSULE | Freq: Two times a day (BID) | ORAL | Status: DC
  Filled 2022-03-16 (×2): qty 1

## 2022-03-16 MED ORDER — CEFAZOLIN SODIUM-DEXTROSE 2-4 GM/100ML-% IV SOLN
2.0000 g | INTRAVENOUS | Status: AC
  Administered 2022-03-16: 2 g via INTRAVENOUS
  Filled 2022-03-16: qty 100

## 2022-03-16 MED ORDER — SUCCINYLCHOLINE CHLORIDE 200 MG/10ML IV SOSY
PREFILLED_SYRINGE | INTRAVENOUS | Status: AC
  Filled 2022-03-16: qty 10

## 2022-03-16 MED ORDER — CHLORHEXIDINE GLUCONATE 0.12 % MT SOLN
15.0000 mL | Freq: Once | OROMUCOSAL | Status: AC
  Administered 2022-03-16: 15 mL via OROMUCOSAL
  Filled 2022-03-16: qty 15

## 2022-03-16 MED ORDER — IBUPROFEN 200 MG PO TABS: 600.0000 mg | ORAL_TABLET | Freq: Four times a day (QID) | ORAL | 1 refills | Status: AC | PRN

## 2022-03-16 MED ORDER — OXYCODONE HCL 5 MG PO TABS: 5.0000 mg | ORAL_TABLET | Freq: Four times a day (QID) | ORAL | 0 refills | Status: AC | PRN

## 2022-03-16 MED ORDER — DEXAMETHASONE SODIUM PHOSPHATE 10 MG/ML IJ SOLN
INTRAMUSCULAR | Status: AC
  Filled 2022-03-16: qty 1

## 2022-03-16 MED ORDER — ONDANSETRON HCL 4 MG/2ML IJ SOLN
INTRAMUSCULAR | Status: DC | PRN
  Administered 2022-03-16: 4 mg via INTRAVENOUS

## 2022-03-16 MED ORDER — 0.9 % SODIUM CHLORIDE (POUR BTL) OPTIME
TOPICAL | Status: DC | PRN
  Administered 2022-03-16: 1000 mL

## 2022-03-16 MED ORDER — SODIUM CHLORIDE (PF) 0.9 % IJ SOLN
PREFILLED_SYRINGE | INTRAMUSCULAR | Status: DC | PRN
  Administered 2022-03-16: 10 mL

## 2022-03-16 MED ORDER — LIDOCAINE 2% (20 MG/ML) 5 ML SYRINGE
INTRAMUSCULAR | Status: AC
  Filled 2022-03-16: qty 5

## 2022-03-16 MED ORDER — OXYCODONE HCL 5 MG/5ML PO SOLN: 5.0000 mg | Freq: Once | ORAL | Status: DC | PRN

## 2022-03-16 MED ORDER — ONDANSETRON HCL 4 MG/2ML IJ SOLN
INTRAMUSCULAR | Status: AC
  Filled 2022-03-16: qty 2

## 2022-03-16 MED ORDER — ONDANSETRON HCL 4 MG/5ML PO SOLN: 4.0000 mg | Freq: Four times a day (QID) | ORAL | Status: DC | PRN

## 2022-03-16 SURGICAL SUPPLY — 69 items
ADH SKN CLS APL DERMABOND .7 (GAUZE/BANDAGES/DRESSINGS) ×1
ATTRACTOMAT 16X20 MAGNETIC DRP (DRAPES) IMPLANT
BAG COUNTER SPONGE SURGICOUNT (BAG) ×1 IMPLANT
BAG SPNG CNTER NS LX DISP (BAG) ×1
BLADE SURG 12 STRL SS (BLADE) IMPLANT
BLADE SURG 15 STRL LF DISP TIS (BLADE) ×1 IMPLANT
BLADE SURG 15 STRL SS (BLADE) ×1
CANISTER SUCT 3000ML PPV (MISCELLANEOUS) ×1 IMPLANT
CLEANER TIP ELECTROSURG 2X2 (MISCELLANEOUS) ×1 IMPLANT
CLIP TI MEDIUM 24 (CLIP) ×1 IMPLANT
CLIP TI WIDE RED SMALL 24 (CLIP) ×1 IMPLANT
CORD BIPOLAR FORCEPS 12FT (ELECTRODE) ×1 IMPLANT
COVER SURGICAL LIGHT HANDLE (MISCELLANEOUS) ×1 IMPLANT
DERMABOND ADVANCED .7 DNX12 (GAUZE/BANDAGES/DRESSINGS) IMPLANT
DRAIN CHANNEL 19F RND (DRAIN) IMPLANT
DRAIN JP 15F RND RADIO PRF (DRAIN) IMPLANT
DRAIN JP 15F RND TROCAR (DRAIN) IMPLANT
DRAIN SNY 10 ROU (WOUND CARE) IMPLANT
DRAIN WOUND SNY 15 RND (WOUND CARE) IMPLANT
DRAPE HALF SHEET 40X57 (DRAPES) IMPLANT
DRAPE INCISE 13X13 STRL (DRAPES) ×1 IMPLANT
DRSG TEGADERM 2-3/8X2-3/4 SM (GAUZE/BANDAGES/DRESSINGS) ×2 IMPLANT
ELECT COATED BLADE 2.86 ST (ELECTRODE) ×1 IMPLANT
ELECT PAIRED SUBDERMAL (MISCELLANEOUS) ×1
ELECT REM PT RETURN 9FT ADLT (ELECTROSURGICAL) ×1
ELECTRODE PAIRED SUBDERMAL (MISCELLANEOUS) ×1 IMPLANT
ELECTRODE REM PT RTRN 9FT ADLT (ELECTROSURGICAL) ×1 IMPLANT
EVACUATOR SILICONE 100CC (DRAIN) IMPLANT
FORCEPS BIPOLAR SPETZLER 8 1.0 (NEUROSURGERY SUPPLIES) IMPLANT
GAUZE 4X4 16PLY ~~LOC~~+RFID DBL (SPONGE) IMPLANT
GLOVE BIO SURGEON STRL SZ7.5 (GLOVE) ×1 IMPLANT
GLOVE BIOGEL PI IND STRL 8 (GLOVE) ×1 IMPLANT
GOWN STRL REUS W/ TWL LRG LVL3 (GOWN DISPOSABLE) ×1 IMPLANT
GOWN STRL REUS W/ TWL XL LVL3 (GOWN DISPOSABLE) ×1 IMPLANT
GOWN STRL REUS W/TWL LRG LVL3 (GOWN DISPOSABLE) ×1
GOWN STRL REUS W/TWL XL LVL3 (GOWN DISPOSABLE) ×1
HEMOSTAT SURGICEL 2X14 (HEMOSTASIS) IMPLANT
KIT BASIN OR (CUSTOM PROCEDURE TRAY) ×1 IMPLANT
KIT TURNOVER KIT B (KITS) ×1 IMPLANT
LOCATOR NERVE 3 VOLT (DISPOSABLE) IMPLANT
NDL HYPO 18GX1.5 BLUNT FILL (NEEDLE) IMPLANT
NDL PRECISIONGLIDE 27X1.5 (NEEDLE) ×1 IMPLANT
NEEDLE HYPO 18GX1.5 BLUNT FILL (NEEDLE) ×1 IMPLANT
NEEDLE PRECISIONGLIDE 27X1.5 (NEEDLE) ×1 IMPLANT
NS IRRIG 1000ML POUR BTL (IV SOLUTION) ×1 IMPLANT
PAD ARMBOARD 7.5X6 YLW CONV (MISCELLANEOUS) ×2 IMPLANT
PATTIES SURGICAL .5 X3 (DISPOSABLE) IMPLANT
PENCIL SMOKE EVACUATOR (MISCELLANEOUS) ×1 IMPLANT
POSITIONER HEAD DONUT 9IN (MISCELLANEOUS) IMPLANT
PROBE NERVBE PRASS .33 (MISCELLANEOUS) ×1 IMPLANT
SPECIMEN JAR MEDIUM (MISCELLANEOUS) ×1 IMPLANT
SPONGE INTESTINAL PEANUT (DISPOSABLE) IMPLANT
SPONGE T-LAP 18X18 ~~LOC~~+RFID (SPONGE) ×1 IMPLANT
STAPLER VISISTAT 35W (STAPLE) ×1 IMPLANT
SUT ETHILON 3 0 PS 1 (SUTURE) IMPLANT
SUT ETHILON 5 0 PS 2 18 (SUTURE) ×1 IMPLANT
SUT ETHILON 6 0 9-3 1X18 BLK (SUTURE) ×1 IMPLANT
SUT SILK 3 0 REEL (SUTURE) ×1 IMPLANT
SUT SILK 3 0 SH CR/8 (SUTURE) ×1 IMPLANT
SUT SILK 4 0 REEL (SUTURE) IMPLANT
SUT VIC AB 3-0 SH 8-18 (SUTURE) ×1 IMPLANT
SUT VIC AB 4-0 PS2 18 (SUTURE) ×1 IMPLANT
SUT VIC AB 4-0 SH 18 (SUTURE) ×1 IMPLANT
SUT VICRYL 4-0 PS2 18IN ABS (SUTURE) IMPLANT
TOWEL GREEN STERILE FF (TOWEL DISPOSABLE) ×1 IMPLANT
TRAY CATH INTERMITTENT SS 16FR (CATHETERS) IMPLANT
TRAY ENT MC OR (CUSTOM PROCEDURE TRAY) ×1 IMPLANT
TRAY FOLEY MTR SLVR 14FR STAT (SET/KITS/TRAYS/PACK) IMPLANT
WATER STERILE IRR 1000ML POUR (IV SOLUTION) ×1 IMPLANT

## 2022-03-16 NOTE — Anesthesia Preprocedure Evaluation (Signed)
Anesthesia Evaluation  Patient identified by MRN, date of birth, ID band Patient awake    Reviewed: Allergy & Precautions, NPO status , Patient's Chart, lab work & pertinent test results  History of Anesthesia Complications Negative for: history of anesthetic complications  Airway Mallampati: IV  TM Distance: >3 FB Neck ROM: Full    Dental  (+) Teeth Intact, Dental Advisory Given   Pulmonary neg shortness of breath, neg sleep apnea, neg COPD, neg recent URI, former smoker   breath sounds clear to auscultation       Cardiovascular hypertension, Pt. on medications and Pt. on home beta blockers (-) angina (-) Past MI and (-) CHF  Rhythm:Regular     Neuro/Psych  PSYCHIATRIC DISORDERS  Depression    negative neurological ROS     GI/Hepatic Neg liver ROS,GERD  ,,  Endo/Other  negative endocrine ROS    Renal/GU negative Renal ROSLab Results      Component                Value               Date                      CREATININE               0.85                02/26/2022                Musculoskeletal  (+) Arthritis ,    Abdominal   Peds  Hematology negative hematology ROS (+) Lab Results      Component                Value               Date                      WBC                      8.5                 02/26/2022                HGB                      16.4                02/26/2022                HCT                      47.3                02/26/2022                MCV                      91.1                02/26/2022                PLT                      313                 02/26/2022  Anesthesia Other Findings   Reproductive/Obstetrics                             Anesthesia Physical Anesthesia Plan  ASA: 2  Anesthesia Plan: General   Post-op Pain Management: Ofirmev IV (intra-op)*   Induction: Intravenous  PONV Risk Score and Plan: 2 and Ondansetron and  Dexamethasone  Airway Management Planned: Oral ETT  Additional Equipment: None  Intra-op Plan:   Post-operative Plan: Extubation in OR  Informed Consent: I have reviewed the patients History and Physical, chart, labs and discussed the procedure including the risks, benefits and alternatives for the proposed anesthesia with the patient or authorized representative who has indicated his/her understanding and acceptance.     Dental advisory given  Plan Discussed with: CRNA  Anesthesia Plan Comments: (Nerve monitoring)       Anesthesia Quick Evaluation

## 2022-03-16 NOTE — Anesthesia Procedure Notes (Signed)
Procedure Name: Intubation Date/Time: 03/16/2022 7:37 AM  Performed by: Dorann Lodge, CRNAPre-anesthesia Checklist: Patient identified, Emergency Drugs available, Suction available and Patient being monitored Patient Re-evaluated:Patient Re-evaluated prior to induction Oxygen Delivery Method: Circle System Utilized Preoxygenation: Pre-oxygenation with 100% oxygen Induction Type: IV induction Ventilation: Mask ventilation without difficulty Laryngoscope Size: Mac and 4 Grade View: Grade III Tube type: Oral Tube size: 7.5 mm Number of attempts: 1 Airway Equipment and Method: Stylet Placement Confirmation: ETT inserted through vocal cords under direct vision, positive ETCO2 and breath sounds checked- equal and bilateral Secured at: 22 cm Tube secured with: Tape Dental Injury: Teeth and Oropharynx as per pre-operative assessment

## 2022-03-16 NOTE — Transfer of Care (Signed)
Immediate Anesthesia Transfer of Care Note  Patient: [name]  Procedure(s) Performed: SUPERFICIAL PAROTIDECTOMY WITH FACIAL NERVE MONITORING (Right)  Patient Location: PACU  Anesthesia Type:General  Level of Consciousness: awake and alert   Airway & Oxygen Therapy: Patient Spontanous Breathing and Patient connected to face mask oxygen  Post-op Assessment: Report given to RN and Post -op Vital signs reviewed and stable  Post vital signs: Reviewed and stable  Last Vitals:  Vitals Value Taken Time  BP    Temp    Pulse    Resp    SpO2      Last Pain:  Vitals:   03/16/22 0619  TempSrc:   PainSc: 0-No pain         Complications: No notable events documented.

## 2022-03-16 NOTE — Op Note (Signed)
OPERATIVE NOTE  [name] Date/Time of Admission: 03/16/2022  5:30 AM  CSN: [id number];WCB:[id number] Attending Provider: Jenetta Downer, MD Room/Bed: MCPO/NONE DOB: 1956/02/26 Age: 66 y.o.   Pre-Op Diagnosis: Mass of right parotid gland  Post-Op Diagnosis: Mass of right parotid gland  Procedure: Procedure(s): RIGHT SUPERFICIAL PAROTIDECTOMY WITH FACIAL NERVE MONITORING (CPT 42415)  Anesthesia: General  Surgeon(s): Pamala Hurry, MD  Staff: Circulator: Josem Kaufmann, RN Relief Circulator: Rozell Searing, RN Relief Scrub: Rozell Searing, RN Scrub Person: Thora Lance RN First Assistant: Celene Squibb, RN  Implants: * No implants in log *  Specimens: ID Type Source Tests Collected by Time Destination  1 : Right Paratoid Tumor Tissue PATH Other SURGICAL PATHOLOGY Jenetta Downer, MD 61/09/735 [id number]     Complications: none  EBL: 30 ML  IVF: Per anesthesia report   Condition: stable  Operative Findings:  ~3cm right parotid tumor removed entirely with cuff of normal parotid tissue Main trunk facial nerve dissection with removal of tumor of lower division / marginal mandibular nerve. Normal facial nerve movement and stimulation at 1.32m at end of procedure  Indications for Procedure: [employer] a 66year old male with long-standing right parotid tumor with interval enlargement. [place] FNA demonstrated low grade epithelial neoplasm. He has requested definitive surgical management. Informed consent obtained. R/B/A discussed including risks of  Informed consent obtained. R/B/A discussed including risks of pain, bleeding, infection, scarring, numbness, Frey's syndrome, first bite syndrome, recurrent tumor, facial nerve weakness/paralysis, ear numbness, hematoma/seroma/abscess, need for further surgery, risks of anesthesia. Despite these risks the patient requested to proceed with surgery.   Description of Operation: Once operative consent was  obtained and the site and surgery were confirmed with the patient and the operating room team, the patient was brought back to the operating room and general endotracheal anesthesia was obtained. He was rotated to 180 degrees. The patient was turned over to the ENT service. The NIM facial nerve monitor was attached to the  orbicularis oculi and orbicularis oris, it was noted to be in good working condition.    A modified Blair incision was planned on the right hand side and infiltrated with plain 1:100,000 epinephrine. The patient was prepped and draped in sterile fashion. Modified Blair incision was then made with a 15 blade. Superiorly, this incision was taken down in the subperichondrial plane, taken to the tragal cartilage until the tragal pointer was identified. Inferiorly, in continuing the dissection, the external jugular vein was identified as well as the greater auricular nerve. The greater auricular nerve branch to the auricle was preserved while branches into the parotid gland were ligated with surgical clips.   A subplatysmal flap was elevated anteriorly and posteriorly in the inferior portion of the incision, and a mid SMAS flap was elevated with 15 blade and metzenbaum scissors anteriorly approximately 2 cm anterior to the location of the mass. This dissection was continued until it met with the superior dissection and that flap was elevated as well.  The anterior border of the sternocleidomastoid muscle was identified and followed until the posterior border of digastric muscle was identified. The posterior digastric muscle was followed to its attachment to the mastoid bone. With superior and inferior dissection plane clearly delineated, which estimated the depth and location of the facial nerve, the facial nerve was identified at the exit from the stylomastoid foramen and traced to the upper and lower division. The mass was excised from the superficial parotid lobe with care  taken to dissect free  each branch of the facial nerve with careful dissection from inferior to superior (the tumor was released off the inferior division / marginal mandibular branch). The mass was freed from the facial nerve and excised with a margin of normal parotid tissue. The mass was well encapsulated and easily dissected free from the surrounding parotid tissue.    Copious irrigation was placed into the wound and hemostasis was obtained. Facial nerve branches were examined and tested with the nerve monitor and intact. A size 15 french suction drain was placed into the incision posteriorly in the right neck and secured with 3-0 silk. The wound was closed with deep interrupted 3-0 Vicryl and 4-0 vicryl platysmal and deep dermal sutures and a combination of running and interrupted 6-0 and nylon suture for the skin. Bacitracin was applied to the wound. The drain was placed on bulb suction. The patient was then turned over to the anesthesiologist. He was extubated in the operating room and sent to the PACU in stable condition. The patient will be admitted overnight for drain care.    Pamala Hurry, MD [facility]NT  03/16/2022

## 2022-03-17 DIAGNOSIS — R221 Localized swelling, mass and lump, neck: Secondary | ICD-10-CM | POA: Diagnosis not present

## 2022-03-17 NOTE — Discharge Summary (Signed)
Physician Discharge Summary  Patient ID: [name] MRN: [id number] DOB/AGE: 10-05-1955 66 y.o.  Admit date: 03/16/2022 Discharge date: 03/17/2022  Admission Diagnoses: Parotid mass  Discharge Diagnoses:  Principal Problem:   Parotid tumor   Discharged Condition: good  Hospital Course: No complications  Consults: none  Significant Diagnostic Studies: none  Treatments: surgery: Right superficial parotidectomy with facial nerve dissection  Discharge Exam: Blood pressure 119/71, pulse 73, temperature 97.8 F (36.6 C), temperature source Oral, resp. rate 20, height '5\' 3"'$  (1.6 m), weight 98 kg, SpO2 94 %. PHYSICAL EXAM: Awake and alert.  Incision looks excellent.  No swelling.  Facial nerve function normal.  Drain is functioning.  Disposition: Discharge disposition: 01-Home or Self Care       Discharge Instructions     Diet - low sodium heart healthy   Complete by: As directed    Discharge patient   Complete by: As directed    DC after seen by ENT Physician Dr. Constance Holster   Discharge disposition: 01-Home or Self Care   Discharge patient date: 03/17/2022   Discharge wound care:   Complete by: As directed    Apply antibiotic ointment twice daily.  Empty drain and recharge 3 times daily.   Increase activity slowly   Complete by: As directed    Nursing communication   Complete by: As directed    Please provide drain care instructions to patient and his wife. Empty drain twice daily and record output. Bulb on negative suction.      Allergies as of 03/17/2022   No Known Allergies      Medication List     TAKE these medications    acetaminophen 500 MG tablet Commonly known as: TYLENOL Take 1 tablet (500 mg total) by mouth every 6 (six) hours as needed for up to 14 days (Pain).   diclofenac 75 MG EC tablet Commonly known as: VOLTAREN Take 75 mg by mouth 2 (two) times daily.   ibuprofen 200 MG tablet Commonly known as: Advil Take 3 tablets (600 mg total)  by mouth every 6 (six) hours as needed for up to 14 days (Pain).   loratadine 10 MG tablet Commonly known as: CLARITIN Take 10 mg by mouth daily.   meclizine 25 MG tablet Commonly known as: ANTIVERT Take 25 mg by mouth 3 (three) times daily as needed for dizziness.   metoprolol succinate 50 MG 24 hr tablet Commonly known as: TOPROL-XL Take 50 mg by mouth daily.   omeprazole 20 MG capsule Commonly known as: PRILOSEC Take 20 mg by mouth at bedtime.   ondansetron 4 MG tablet Commonly known as: ZOFRAN Take 1 tablet (4 mg total) by mouth every 6 (six) hours as needed for nausea.   oxyCODONE 5 MG immediate release tablet Commonly known as: Roxicodone Take 1 tablet (5 mg total) by mouth every 6 (six) hours as needed for up to 5 days (Pain not relieved by Tyenol or motrin).   timolol 0.5 % ophthalmic solution Commonly known as: TIMOPTIC Place 1 drop into both eyes at bedtime.   Travoprost (BAK Free) 0.004 % Soln ophthalmic solution Commonly known as: TRAVATAN Place 1 drop into both eyes at bedtime.               Discharge Care Instructions  (From admission, onward)           Start     Ordered   03/17/22 0000  Discharge wound care:       Comments: Apply  antibiotic ointment twice daily.  Empty drain and recharge 3 times daily.   03/17/22 0854             Signed: Izora Gala 03/17/2022, 8:54 AM

## 2022-03-18 ENCOUNTER — Encounter (HOSPITAL_COMMUNITY): Payer: Self-pay | Admitting: Otolaryngology

## 2022-03-19 LAB — SURGICAL PATHOLOGY

## 2022-03-20 NOTE — Anesthesia Postprocedure Evaluation (Signed)
Anesthesia Post Note  Patient: [name]  Procedure(s) Performed: SUPERFICIAL PAROTIDECTOMY WITH FACIAL NERVE MONITORING (Right)     Patient location during evaluation: PACU Anesthesia Type: General Level of consciousness: awake and alert Pain management: pain level controlled Vital Signs Assessment: post-procedure vital signs reviewed and stable Respiratory status: spontaneous breathing, nonlabored ventilation, respiratory function stable and patient connected to nasal cannula oxygen Cardiovascular status: blood pressure returned to baseline and stable Postop Assessment: no apparent nausea or vomiting Anesthetic complications: no  No notable events documented.  Last Vitals:  Vitals:   03/17/22 0458 03/17/22 0818  BP: 119/71 (!) 119/58  Pulse: 73 67  Resp:  17  Temp: 36.6 C 36.4 C  SpO2: 94% 98%    Last Pain:  Vitals:   03/17/22 0930  TempSrc:   PainSc: 0-No pain                 Suetta Hoffmeister

## 2022-09-01 IMAGING — CT CT RENAL STONE PROTOCOL
2 of 4 series · 16 of 46 positions shown, 18 images · non-contrast
Comparison: None.

CLINICAL DATA: Flank pain

EXAM:
CT ABDOMEN AND PELVIS WITHOUT CONTRAST
TECHNIQUE: Multidetector CT imaging of the abdomen and pelvis was performed
following the standard protocol without IV contrast.

[Series 2: stone full · axial · 0.78mm/px · z∈[-1217,-752]mm · 13 of 103 slices shown, 15 images]
[im 5/103  soft-tissue]
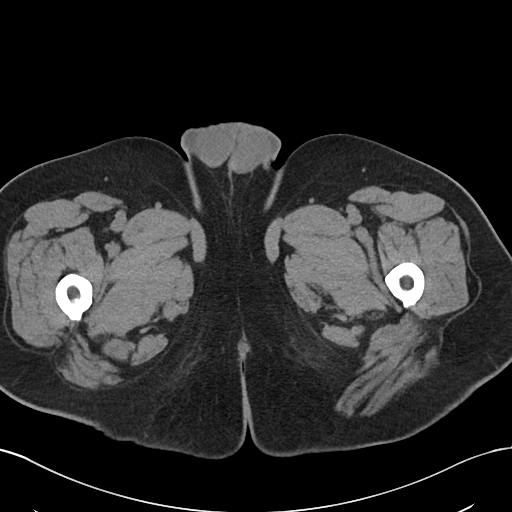
[im 5/103  bone]
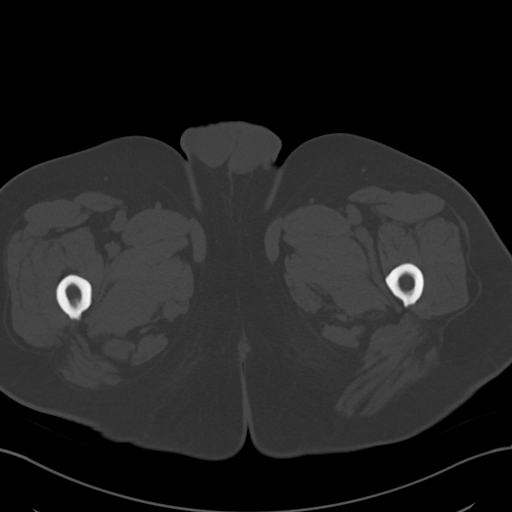
[im 14/103  soft-tissue]
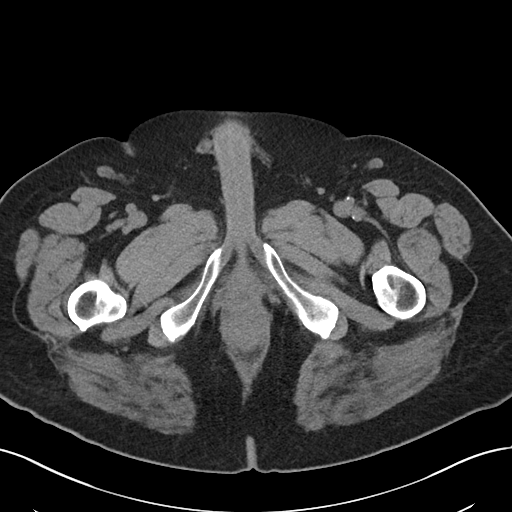
[im 23/103  soft-tissue]
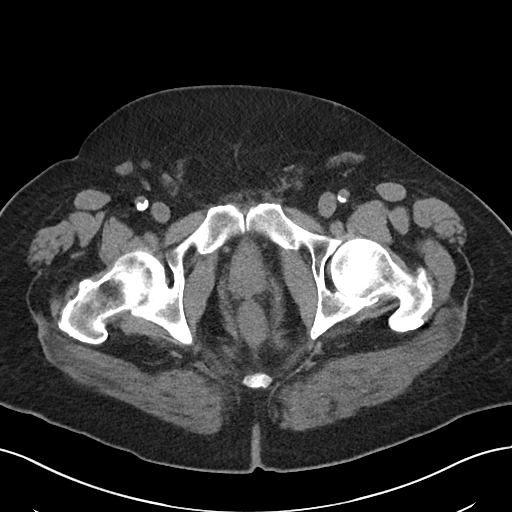
[im 27/103  soft-tissue]
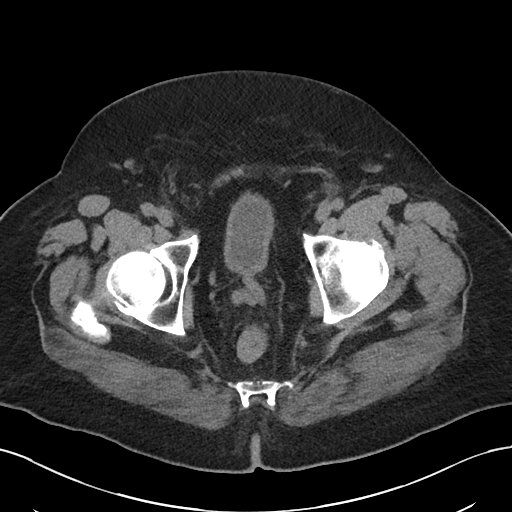
[im 36/103  soft-tissue]
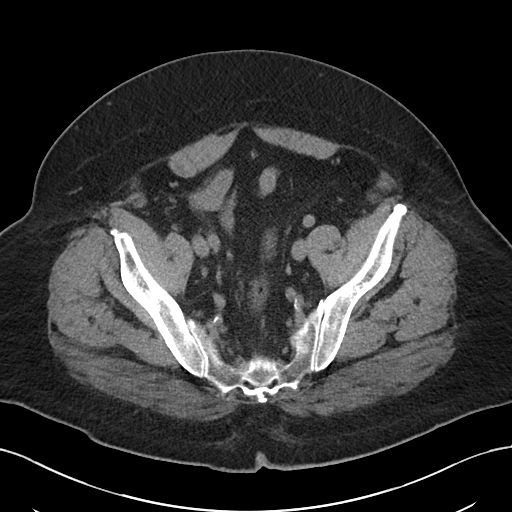
[im 45/103  soft-tissue]
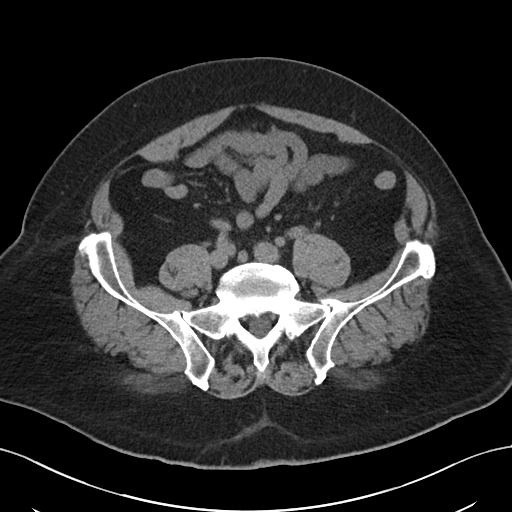
[im 54/103  soft-tissue]
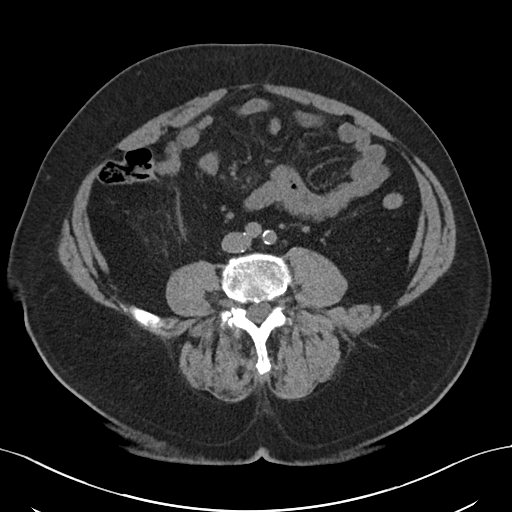
[im 58/103  soft-tissue]
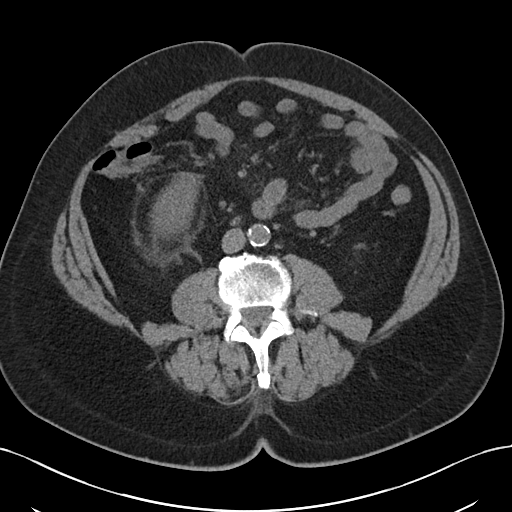
[im 67/103  soft-tissue]
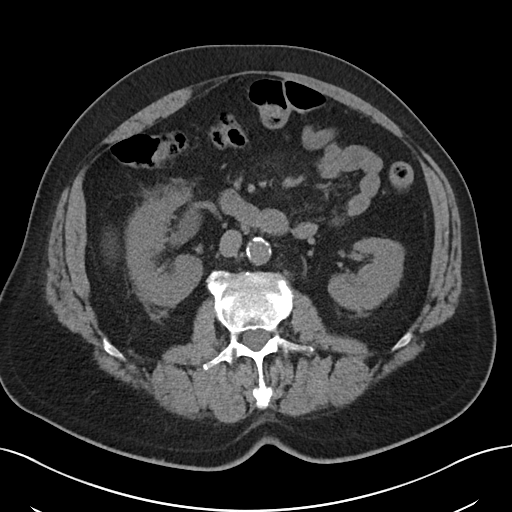
[im 67/103  bone]
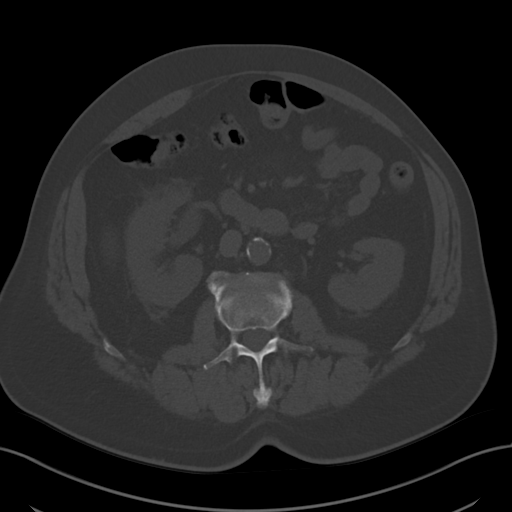
[im 76/103  soft-tissue]
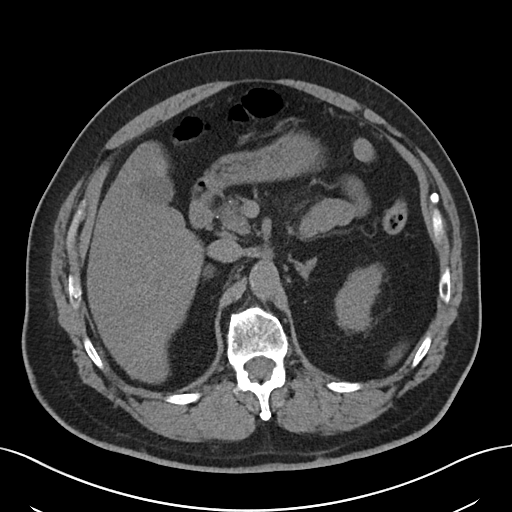
[im 80/103  soft-tissue]
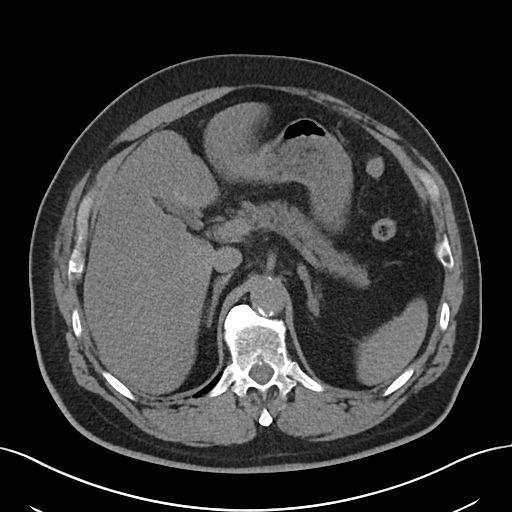
[im 89/103  soft-tissue]
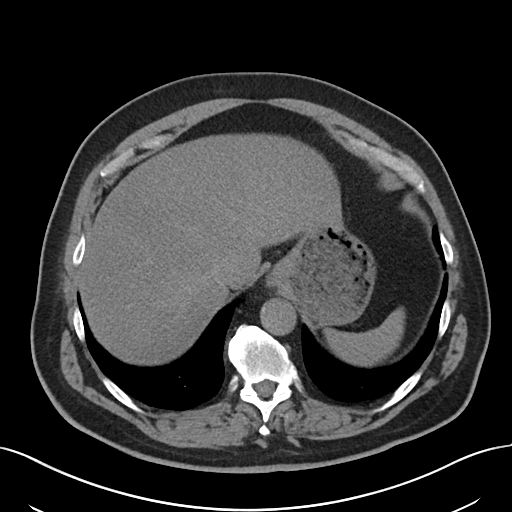
[im 98/103  soft-tissue]
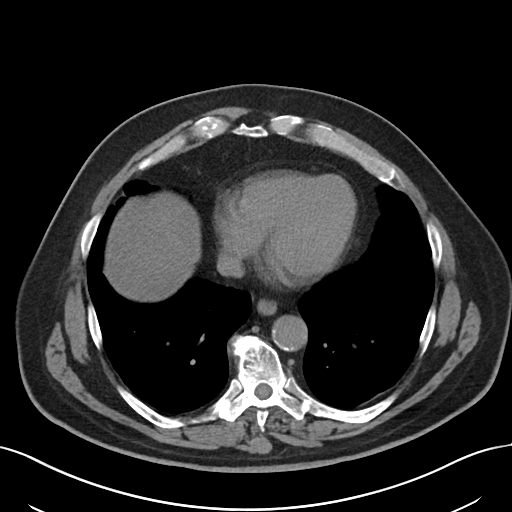

[Series 5: coronal · coronal · 0.80mm/px · 3 of 108 slices shown]
[im 36/108  soft-tissue]
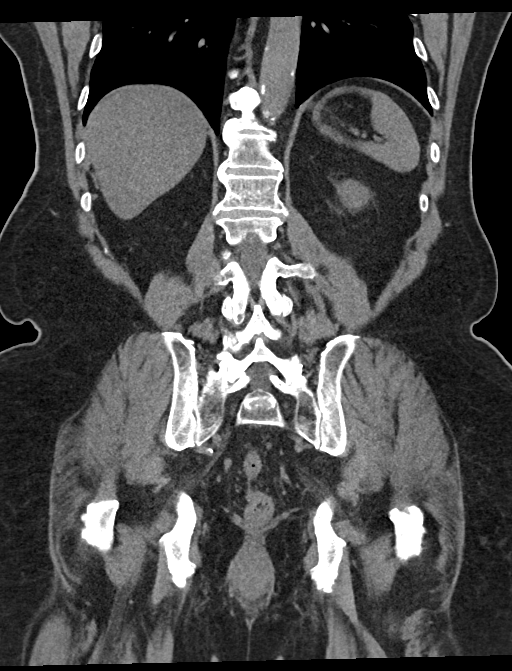
[im 48/108  soft-tissue]
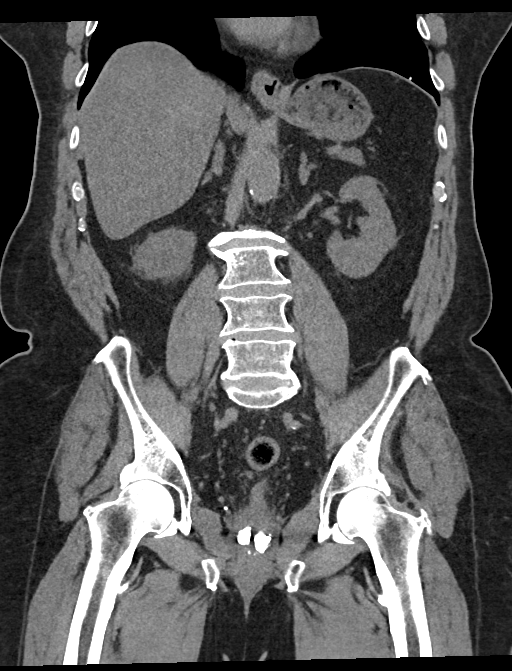
[im 60/108  soft-tissue]
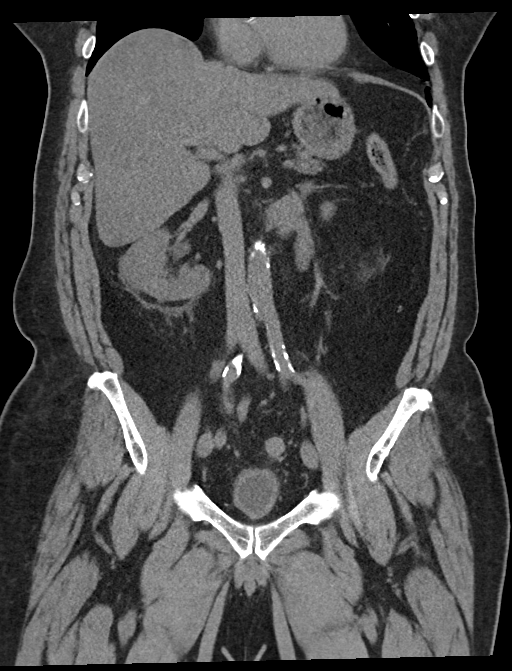

[16 of 46 positions shown; findings below may reference images not displayed]

FINDINGS: LOWER CHEST: Normal.

HEPATOBILIARY: Normal hepatic contours. No intra- or extrahepatic
biliary dilatation. The gallbladder is normal.

PANCREAS: Normal pancreas. No ductal dilatation or peripancreatic
fluid collection.

SPLEEN: Normal.

ADRENALS/URINARY TRACT: The adrenal glands are normal. Right kidney
is edematous with pelviectasis and moderate perinephric stranding.
There is a stone at the right ureteropelvic junction measuring 5 mm.
There is a nonobstructing interpolar stone that measures 9 mm. Left
kidney is normal. The urinary bladder is normal for degree of
distention

STOMACH/BOWEL: There is no hiatal hernia. Normal duodenal course and
caliber. No small bowel dilatation or inflammation. No focal colonic
abnormality. The appendix is not visualized. No right lower quadrant
inflammation or free fluid.

VASCULAR/LYMPHATIC: There is calcific atherosclerosis of the
abdominal aorta. No lymphadenopathy.

REPRODUCTIVE: There are calcifications within the normal-sized
prostate. Symmetric seminal vesicles.

MUSCULOSKELETAL. No bony spinal canal stenosis or focal osseous
abnormality.

OTHER: None.
IMPRESSION: 1. Right-sided obstructive uropathy with 5 mm stone at the right
ureteropelvic junction causing pelviectasis and moderate perinephric
stranding.
2. Nonobstructing 9 mm interpolar stone.
3.  Aortic atherosclerosis ([id number]).

## 2022-11-06 ENCOUNTER — Other Ambulatory Visit: Payer: Self-pay | Admitting: Physician Assistant

## 2022-11-06 ENCOUNTER — Telehealth: Payer: Self-pay | Admitting: Physician Assistant

## 2022-11-06 ENCOUNTER — Other Ambulatory Visit (INDEPENDENT_AMBULATORY_CARE_PROVIDER_SITE_OTHER): Payer: Medicare HMO

## 2022-11-06 ENCOUNTER — Ambulatory Visit: Payer: Medicare HMO | Admitting: Physician Assistant

## 2022-11-06 ENCOUNTER — Encounter: Payer: Self-pay | Admitting: Physician Assistant

## 2022-11-06 DIAGNOSIS — M25511 Pain in right shoulder: Secondary | ICD-10-CM

## 2022-11-06 MED ORDER — HYDROCODONE-ACETAMINOPHEN 10-325 MG PO TABS: 0.5000 | ORAL_TABLET | Freq: Four times a day (QID) | ORAL | 0 refills | Status: AC | PRN

## 2022-11-06 MED ORDER — HYDROCODONE-ACETAMINOPHEN 5-325 MG PO TABS: 1.0000 | ORAL_TABLET | Freq: Four times a day (QID) | ORAL | 0 refills | Status: DC | PRN

## 2022-11-06 MED ORDER — MELOXICAM 7.5 MG PO TABS: 7.5000 mg | ORAL_TABLET | Freq: Every day | ORAL | 0 refills | Status: AC

## 2022-11-06 NOTE — Telephone Encounter (Signed)
Pt called in stating the pharmacy only have Hydrocodone 10.325 MG the 5.325 mg is on Backorder he is currently at the pharmacy

## 2022-11-06 NOTE — Progress Notes (Signed)
Office Visit Note   Patient: [name]           Date of Birth: 1955-11-18           MRN: [id number] Visit Date: 11/06/2022              Requested by: Eartha Inch, MD [address] [place],  [place] [address] PCP: Eartha Inch, MD   Assessment & Plan: Visit Diagnoses:  1. Acute pain of right shoulder     Plan: Pleasant 67 year old gentleman who is 3 to 4 weeks status post falling onto his right shoulder.  He is right-hand dominant.  He denies any ecchymosis or bruising at the time.  Focus is pain over the top of the shoulder.  He has been using Tylenol but does not seem to be helping too much.  X-rays did not show any acute fracture.  He does have findings consistent with some shoulder impingement and rotator cuff tendinitis.  He actually has fairly good strength and equal to his unaffected side as he has no neck pain or numbing or tingling.  He does have a positive empty can test.  We discussed treatment options certainly we could try an injection today but he would like to defer that.  I would like for him to start using some Voltaren gel.  Would also start him on a low-dose of meloxicam.  He understands not to take any other anti-inflammatories with this.  Also given him a few hydrocodone which he understands he should only take at night if he needs it.  I have also given him some rotator cuff stretching exercises.  Will follow-up in 2 weeks or sooner if he gets worse  Follow-Up Instructions: No follow-ups on file.   Orders:  No orders of the defined types were placed in this encounter.  No orders of the defined types were placed in this encounter.     Procedures: No procedures performed   Clinical Data: No additional findings.   Subjective: No chief complaint on file.   HPI [name] is a pleasant 67 year old gentleman with a 3 to 4-week history of right shoulder pain.  He said he fell while he was standing up to put on his shoe with a shoe  horn.  He said he landed hard on directly on the right shoulder.  He is right-hand dominant.  Occasionally pain goes down the right arm to the hand but no to fingers.  Occasionally numbing and tingling.  He describes burning in his shoulder has sharp pain in his shoulder with movements.  He has been taking Tylenol originally this helped no longer working wakes him from sleep described the pain is moderate and increasing  Review of Systems  All other systems reviewed and are negative.    Objective: Vital Signs: There were no vitals taken for this visit.  Physical Exam Constitutional:      Appearance: Normal appearance.  Pulmonary:     Effort: Pulmonary effort is normal.  Musculoskeletal:        General: Normal range of motion.  Skin:    General: Skin is warm and dry.  Neurological:     General: No focal deficit present.     Mental Status: He is alert and oriented to person, place, and time.  Psychiatric:        Mood and Affect: Mood normal.        Behavior: Behavior normal.     Ortho Exam Right  arm no ecchymosis no swelling.  He is strong distal pulses his hand is warm good grip strength.  No radicular findings or pain with range of motion of his neck.  He has good strength with abduction as well as flexion extension of his arm.  He is has active overhead range of motion which is painful once he is above his head.  No pain with external rotation good good strength with external and internal rotation he does have some limitations with internal rotation behind his back secondary to pain compartments are soft and compressible Specialty Comments:  No specialty comments available.  Imaging: No results found.   PMFS History: Patient Active Problem List   Diagnosis Date Noted   Parotid tumor 03/16/2022   Acute pyelonephritis 08/26/2020   GERD without esophagitis 08/26/2020   Essential hypertension 08/26/2020   Hyponatremia 08/26/2020   Hyperglycemia 08/26/2020   Past Medical  History:  Diagnosis Date   Arthritis    Depression    Dysuria    GERD (gastroesophageal reflux disease)    Glaucoma, both eyes    History of kidney stones    Hypertension    Right ureteral stone    Urgency of urination    Wears glasses     Family History  Problem Relation Age of Onset   Bladder Cancer Mother    Coronary artery disease Mother    Heart disease Mother    Stroke Father    Alcoholism Father     Past Surgical History:  Procedure Laterality Date   COLONOSCOPY  11/2018   CYSTOSCOPY WITH STENT PLACEMENT Right 08/26/2020   Procedure: CYSTOSCOPY WITH STENT PLACEMENT;  Surgeon: Jerilee Field, MD;  Location: [facility];  Service: Urology;  Laterality: Right;   CYSTOSCOPY/URETEROSCOPY/HOLMIUM LASER/STENT PLACEMENT Right 09/27/2020   Procedure: CYSTOSCOPY/URETEROSCOPY/HOLMIUM LASER/STENT EXCHANGE;  Surgeon: Jerilee Field, MD;  Location: [facility];  Service: Urology;  Laterality: Right;   HARDWARE REMOVAL Left 07/11/2009   @ [facility]  left tibual nail and screw,  still has retained plate   IM NAILING TIBIA Left 05/09/2008   @[facility]    INGUINAL HERNIA REPAIR Left 12/21/1999   @[facility]    KNEE SURGERY Right    1970s   NOSE SURGERY     repaired skin after injury   PAROTIDECTOMY Right 03/16/2022   Procedure: SUPERFICIAL PAROTIDECTOMY WITH FACIAL NERVE MONITORING;  Surgeon: Scarlette Ar, MD;  Location: [facility];  Service: ENT;  Laterality: Right;   TOTAL KNEE ARTHROPLASTY Right 07/12/2017   Procedure: RIGHT TOTAL KNEE ARTHROPLASTY -CEMENTED;  Surgeon: Eldred Manges, MD;  Location: [facility];  Service: Orthopedics;  Laterality: Right;   Social History   Occupational History   Not on file  Tobacco Use   Smoking status: Former    Current packs/day: 0.00    Types: Cigarettes    Start date: 12/18/1969    Quit date: 12/19/1999    Years since quitting: 22.8   Smokeless tobacco: Never  Vaping Use   Vaping status: Never Used  Substance and Sexual Activity   Alcohol use: Yes     Comment: Maybe 1-2 beers once a month   Drug use: Never   Sexual activity: Not on file

## 2022-12-25 ENCOUNTER — Ambulatory Visit: Payer: Medicare HMO | Admitting: Orthopaedic Surgery

## 2022-12-25 VITALS — BP 126/65 | HR 69

## 2022-12-25 DIAGNOSIS — M7541 Impingement syndrome of right shoulder: Secondary | ICD-10-CM | POA: Diagnosis not present

## 2022-12-28 DIAGNOSIS — M7541 Impingement syndrome of right shoulder: Secondary | ICD-10-CM | POA: Insufficient documentation

## 2022-12-28 MED ORDER — LIDOCAINE HCL 1 % IJ SOLN
0.5000 mL | INTRAMUSCULAR | Status: AC | PRN
  Administered 2022-12-28: .5 mL

## 2022-12-28 MED ORDER — BUPIVACAINE HCL 0.25 % IJ SOLN
4.0000 mL | INTRAMUSCULAR | Status: AC | PRN
  Administered 2022-12-28: 4 mL via INTRA_ARTICULAR

## 2022-12-28 MED ORDER — METHYLPREDNISOLONE ACETATE 40 MG/ML IJ SUSP
40.0000 mg | INTRAMUSCULAR | Status: AC | PRN
  Administered 2022-12-28: 40 mg via INTRA_ARTICULAR

## 2022-12-28 NOTE — Progress Notes (Signed)
Office Visit Note   Patient: [name]           Date of Birth: Apr 28, 1955           MRN: [id number] Visit Date: 12/25/2022              Requested by: Eartha Inch, MD [address] [place],  [place] [address] PCP: Eartha Inch, MD   Assessment & Plan: Visit Diagnoses:  1. Impingement syndrome of right shoulder     Plan: Right subacromial injection performed.  If he has persistent problems and we can consider diagnostic imaging to evaluate him for supraspinatus tear.  He tolerated the injection well.  Follow-Up Instructions: Return if symptoms worsen or fail to improve.   Orders:  Orders Placed This Encounter  Procedures   Large Joint Inj   No orders of the defined types were placed in this encounter.     Procedures: Large Joint Inj: R subacromial bursa on 12/28/2022 11:00 AM Indications: pain Details: 22 G 1.5 in needle, lateral approach  Arthrogram: No  Medications: 4 mL bupivacaine 0.25 %; 40 mg methylPREDNISolone acetate 40 MG/ML; 0.5 mL lidocaine 1 % Outcome: tolerated well, no immediate complications Procedure, treatment alternatives, risks and benefits explained, specific risks discussed. Consent was given by the patient. Immediately prior to procedure a time out was called to verify the correct patient, procedure, equipment, support staff and site/side marked as required. Patient was prepped and draped in the usual sterile fashion.       Clinical Data: No additional findings.   Subjective: Chief Complaint  Patient presents with   Right Shoulder - Follow-up    HPI 67 year old male seen in follow-up after a fall early July on the shoulder with persistent pain decreased range of motion.  When he moves it sometimes feels like he grabs or catches.  He has taken a few  hydrocodon has been using meloxicam and Voltaren gel.  Previous x-ray showed subacromial spur as well as some calcification subacromial space which may represent  supraspinatus calcification with calcific tendinopathy.  He is able to get his arm in abduction position but does have some pain.  Review of Systems all systems updated noncontributory.  Positive hyperglycemia hypertension history of pyelonephritis.   Objective: Vital Signs: BP 126/65   Pulse 69   Physical Exam Constitutional:      Appearance: He is well-developed.  HENT:     Head: Normocephalic and atraumatic.     Right Ear: External ear normal.     Left Ear: External ear normal.  Eyes:     Pupils: Pupils are equal, round, and reactive to light.  Neck:     Thyroid: No thyromegaly.     Trachea: No tracheal deviation.  Cardiovascular:     Rate and Rhythm: Normal rate.  Pulmonary:     Effort: Pulmonary effort is normal.     Breath sounds: No wheezing.  Abdominal:     General: Bowel sounds are normal.     Palpations: Abdomen is soft.  Musculoskeletal:     Cervical back: Neck supple.  Skin:    General: Skin is warm and dry.     Capillary Refill: Capillary refill takes less than 2 seconds.  Neurological:     Mental Status: He is alert and oriented to person, place, and time.  Psychiatric:        Behavior: Behavior normal.        Thought Content: Thought content normal.  Judgment: Judgment normal.     Ortho Exam positive impingement negative drop arm test.  Long head of biceps is tender good brachial plexus range of motion negative Spurling.  Opposite shoulder no impingement.  Specialty Comments:  No specialty comments available.  Imaging: No results found.   PMFS History: Patient Active Problem List   Diagnosis Date Noted   Impingement syndrome of right shoulder 12/28/2022   Parotid tumor 03/16/2022   Acute pyelonephritis 08/26/2020   GERD without esophagitis 08/26/2020   Essential hypertension 08/26/2020   Hyponatremia 08/26/2020   Hyperglycemia 08/26/2020   Past Medical History:  Diagnosis Date   Arthritis    Depression    Dysuria    GERD  (gastroesophageal reflux disease)    Glaucoma, both eyes    History of kidney stones    Hypertension    Right ureteral stone    Urgency of urination    Wears glasses     Family History  Problem Relation Age of Onset   Bladder Cancer Mother    Coronary artery disease Mother    Heart disease Mother    Stroke Father    Alcoholism Father     Past Surgical History:  Procedure Laterality Date   COLONOSCOPY  11/2018   CYSTOSCOPY WITH STENT PLACEMENT Right 08/26/2020   Procedure: CYSTOSCOPY WITH STENT PLACEMENT;  Surgeon: Jerilee Field, MD;  Location: [facility];  Service: Urology;  Laterality: Right;   CYSTOSCOPY/URETEROSCOPY/HOLMIUM LASER/STENT PLACEMENT Right 09/27/2020   Procedure: CYSTOSCOPY/URETEROSCOPY/HOLMIUM LASER/STENT EXCHANGE;  Surgeon: Jerilee Field, MD;  Location[facility];  Service: Urology;  Laterality: Right;   HARDWARE REMOVAL Left 07/11/2009   @ [facility]  left tibual nail and screw,  still has retained plate   IM NAILING TIBIA Left 05/09/2008   @[facility]    INGUINAL HERNIA REPAIR Left 12/21/1999   @[facility]    KNEE SURGERY Right    1970s   NOSE SURGERY     repaired skin after injury   PAROTIDECTOMY Right 03/16/2022   Procedure: SUPERFICIAL PAROTIDECTOMY WITH FACIAL NERVE MONITORING;  Surgeon: Scarlette Ar, MD;  Location: [facility];  Service: ENT;  Laterality: Right;   TOTAL KNEE ARTHROPLASTY Right 07/12/2017   Procedure: RIGHT TOTAL KNEE ARTHROPLASTY -CEMENTED;  Surgeon: Eldred Manges, MD;  Location: [facility];  Service: Orthopedics;  Laterality: Right;   Social History   Occupational History   Not on file  Tobacco Use   Smoking status: Former    Current packs/day: 0.00    Types: Cigarettes    Start date: 12/18/1969    Quit date: 12/19/1999    Years since quitting: 23.0   Smokeless tobacco: Never  Vaping Use   Vaping status: Never Used  Substance and Sexual Activity   Alcohol use: Yes    Comment: Maybe 1-2 beers once a month   Drug use: Never   Sexual  activity: Not on file

## 2023-01-01 ENCOUNTER — Ambulatory Visit: Payer: Medicare HMO | Admitting: Orthopaedic Surgery

## 2024-02-27 ENCOUNTER — Ambulatory Visit (INDEPENDENT_AMBULATORY_CARE_PROVIDER_SITE_OTHER)

## 2024-02-27 ENCOUNTER — Other Ambulatory Visit (HOSPITAL_BASED_OUTPATIENT_CLINIC_OR_DEPARTMENT_OTHER): Payer: Self-pay

## 2024-02-27 ENCOUNTER — Ambulatory Visit (HOSPITAL_BASED_OUTPATIENT_CLINIC_OR_DEPARTMENT_OTHER): Admitting: Student

## 2024-02-27 DIAGNOSIS — M25561 Pain in right knee: Secondary | ICD-10-CM | POA: Diagnosis not present

## 2024-02-27 DIAGNOSIS — Z96651 Presence of right artificial knee joint: Secondary | ICD-10-CM

## 2024-02-27 MED ORDER — TRAMADOL HCL 50 MG PO TABS
50.0000 mg | ORAL_TABLET | Freq: Four times a day (QID) | ORAL | 0 refills | Status: AC | PRN
  Filled 2024-02-27: qty 20, 5d supply, fill #0

## 2024-02-27 MED ORDER — LIDOCAINE HCL 1 % IJ SOLN
4.0000 mL | INTRAMUSCULAR | Status: AC | PRN
  Administered 2024-02-27: 4 mL

## 2024-02-27 NOTE — Progress Notes (Addendum)
 Chief Complaint: Right knee pain     History of Present Illness:    [name] is a very pleasant 68 y.o. male who presents today for evaluation of right knee pain and swelling.  Patient underwent a right TKA with Dr. Barbarann in 2019 and overall has reported no issues.  About 3 days ago, he suddenly developed pain within the knee making it very difficult to weight-bear.  He denies any injury or known cause.  This has been worse typically in the morning and has only improved slightly.  Denies any fever or fatigue however has experienced a few transient episodes of chills.   Surgical History:   Right knee TKA 2019  PMH/PSH/Family History/Social History/Meds/Allergies:    Past Medical History:  Diagnosis Date   Arthritis    Depression    Dysuria    GERD (gastroesophageal reflux disease)    Glaucoma, both eyes    History of kidney stones    Hypertension    Right ureteral stone    Urgency of urination    Wears glasses    Past Surgical History:  Procedure Laterality Date   COLONOSCOPY  11/2018   CYSTOSCOPY WITH STENT PLACEMENT Right 08/26/2020   Procedure: CYSTOSCOPY WITH STENT PLACEMENT;  Surgeon: Nieves Cough, MD;  Location: [facility];  Service: Urology;  Laterality: Right;   CYSTOSCOPY/URETEROSCOPY/HOLMIUM LASER/STENT PLACEMENT Right 09/27/2020   Procedure: CYSTOSCOPY/URETEROSCOPY/HOLMIUM LASER/STENT EXCHANGE;  Surgeon: Nieves Cough, MD;  Location: [facility];  Service: Urology;  Laterality: Right;   HARDWARE REMOVAL Left 07/11/2009   @ [facility]  left tibual nail and screw,  still has retained plate   IM NAILING TIBIA Left 05/09/2008   @[facility]    INGUINAL HERNIA REPAIR Left 12/21/1999   @[facility]    KNEE SURGERY Right    1970s   NOSE SURGERY     repaired skin after injury   PAROTIDECTOMY Right 03/16/2022   Procedure: SUPERFICIAL PAROTIDECTOMY WITH FACIAL NERVE MONITORING;  Surgeon: Luciano Standing, MD;  Location: [facility];   Service: ENT;  Laterality: Right;   TOTAL KNEE ARTHROPLASTY Right 07/12/2017   Procedure: RIGHT TOTAL KNEE ARTHROPLASTY -CEMENTED;  Surgeon: Barbarann Oneil BROCKS, MD;  Location: [facility];  Service: Orthopedics;  Laterality: Right;   Social History   Socioeconomic History   Marital status: Married    Spouse name: Not on file   Number of children: 0   Years of education: Not on file   Highest education level: Not on file  Occupational History   Not on file  Tobacco Use   Smoking status: Former    Current packs/day: 0.00    Types: Cigarettes    Start date: 12/18/1969    Quit date: 12/19/1999    Years since quitting: 24.2   Smokeless tobacco: Never  Vaping Use   Vaping status: Never Used  Substance and Sexual Activity   Alcohol use: Yes    Comment: Maybe 1-2 beers once a month   Drug use: Never   Sexual activity: Not on file  Other Topics Concern   Not on file  Social History Narrative   Not on file   Social Drivers of Health   Financial Resource Strain: Low Risk  (02/05/2024)   Received from [facility]   Overall Financial Resource Strain (CARDIA)    How hard is  it for you to pay for the very basics like food, housing, medical care, and heating?: Not hard at all  Food Insecurity: Unknown (02/05/2024)   Received from [facility]   Hunger Vital Sign    Within the past 12 months, you worried that your food would run out before you got the money to buy more.: Never true    Ran Out of Food in the Last Year: Not on file  Transportation Needs: No Transportation Needs (02/05/2024)   Received from [facility] - Transportation    In the past 12 months, has lack of transportation kept you from medical appointments or from getting medications?: No    In the past 12 months, has lack of transportation kept you from meetings, work, or from getting things needed for daily living?: No  Physical Activity: Sufficiently Active (02/05/2024)   Received from [facility]   Exercise  Vital Sign    On average, how many days per week do you engage in moderate to strenuous exercise (like a brisk walk)?: 7 days    On average, how many minutes do you engage in exercise at this level?: 60 min  Stress: No Stress Concern Present (02/05/2024)   Received from [facility] - Occupational Stress Questionnaire    Do you feel stress - tense, restless, nervous, or anxious, or unable to sleep at night because your mind is troubled all the time - these days?: Not at all  Social Connections: Socially Integrated (02/05/2024)   Received from [facility]   Social Network    How would you rate your social network (family, work, friends)?: Good participation with social networks   Family History  Problem Relation Age of Onset   Bladder Cancer Mother    Coronary artery disease Mother    Heart disease Mother    Stroke Father    Alcoholism Father    No Known Allergies Current Outpatient Medications  Medication Sig Dispense Refill   traMADol  (ULTRAM ) 50 MG tablet Take 1 tablet (50 mg total) by mouth every 6 (six) hours as needed for up to 5 days. 20 tablet 0   HYDROcodone -acetaminophen  (NORCO) 10-325 MG tablet Take 0.5 tablets by mouth every 6 (six) hours as needed for moderate pain. 10 tablet 0   loratadine  (CLARITIN ) 10 MG tablet Take 10 mg by mouth daily.     meclizine (ANTIVERT) 25 MG tablet Take 25 mg by mouth 3 (three) times daily as needed for dizziness.     meloxicam  (MOBIC ) 7.5 MG tablet Take 1 tablet (7.5 mg total) by mouth daily. 30 tablet 0   metoprolol  succinate (TOPROL -XL) 50 MG 24 hr tablet Take 50 mg by mouth daily.     omeprazole (PRILOSEC) 20 MG capsule Take 20 mg by mouth at bedtime.     ondansetron  (ZOFRAN ) 4 MG tablet Take 1 tablet (4 mg total) by mouth every 6 (six) hours as needed for nausea. 20 tablet 0   timolol  (TIMOPTIC ) 0.5 % ophthalmic solution Place 1 drop into both eyes at bedtime.     Travoprost , BAK Free,  (TRAVATAN ) 0.004 % SOLN ophthalmic solution Place 1 drop into both eyes at bedtime.     No current facility-administered medications for this visit.   No results found.  Review of Systems:   A ROS was performed including pertinent positives and negatives as documented in the HPI.  Physical Exam :   Constitutional: NAD and appears stated age  Neurological: Alert and oriented Psych: Appropriate affect and cooperative There were no vitals taken for this visit.   Comprehensive Musculoskeletal Exam:    Exam of the right knee demonstrates a well-healed TKA incision scar.  The knee does appear swollen with a moderate effusion present.  No significant erythema or warmth.  Active range of motion 10 to 90 degrees.  Patient is ambulating with antalgic gait.  Imaging:   Xray (right knee 4 views): Negative for acute fracture or notable hardware complication   I personally reviewed and interpreted the radiographs.   Assessment:   68 y.o. male with history of a right knee TKA performed in 2019.  About 3 days ago patient developed sudden and severe pain within the knee without any known cause or injury.  On exam there is a moderate-sized effusion and discussed the need and recommendation for aspiration in order to assess fluid.  Patient is agreeable and I was able to aspirate 20 cc of turbid fluid.  Based on this appearance, I did discuss with patient that there is some concern for a possible developing infection.  Advised that I will try and set up a close follow-up with one of our joint replacement surgeons and we will send the fluid for lab analysis.  In the meantime, discussed follow-up in the ED should he develop any increased redness or pain of the knee as well as fever or chills.  Patient is in understanding and agreement with plan.  Plan :    - Knee aspiration performed and sent for synovial fluid analysis with culture and Gram stain - Follow-up within a few days with Dr. Vernetta or Dr. Jerri  for further assessment and management    Procedure Note  Patient: [name]             Date of Birth: 1955/06/07           MRN: [id number]             Visit Date: 02/27/2024  Procedures: Visit Diagnoses:  1. Acute pain of right knee   2. History of total knee arthroplasty, right     Large Joint Inj: R knee on 02/27/2024 11:00 PM Indications: pain and joint swelling Details: 18 G 1.5 in needle, superolateral approach Medications: 4 mL lidocaine  1 % Aspirate: 20 mL cloudy and blood-tinged; sent for lab analysis Outcome: tolerated well, no immediate complications Procedure, treatment alternatives, risks and benefits explained, specific risks discussed. Consent was given by the patient. Immediately prior to procedure a time out was called to verify the correct patient, procedure, equipment, support staff and site/side marked as required. Patient was prepped and draped in the usual sterile fashion.       I personally saw and evaluated the patient, and participated in the management and treatment plan.  Leonce Reveal, PA-C Orthopedics

## 2024-03-02 ENCOUNTER — Other Ambulatory Visit: Payer: Self-pay | Admitting: Radiology

## 2024-03-02 ENCOUNTER — Ambulatory Visit: Admitting: Orthopaedic Surgery

## 2024-03-02 DIAGNOSIS — Z96651 Presence of right artificial knee joint: Secondary | ICD-10-CM | POA: Diagnosis not present

## 2024-03-02 DIAGNOSIS — M25561 Pain in right knee: Secondary | ICD-10-CM | POA: Diagnosis not present

## 2024-03-02 NOTE — Progress Notes (Signed)
 The patient is somewhat I am seeing for the first time but he was sent over from [facility] after injection aspirated his knee and found 3600 white blood cells.  He has a knee that was replaced by Dr. Barbarann secondary arthritis back in early 2019.  The pain is been off and on with his right knee but recently became acute.  He denies any fever but has had some chills.  At 68 years old he still works and is not been able to work.  Examination of his right knee today shows no redness.  There are some slight warmth.  I was able to aspirate additional maybe 10 cc of fluid off of his knee.  His white blood cell count from his aspirate from a week ago was 3684.  I did review x-rays of his right knee and see no worrisome features from the replacement itself.  There is no gross evidence of loosening.  We need to send him for a three-phase bone scan to rule out prosthetic loosening.  Will give him a note to keep him out of work until further notice as well and see him back in follow-up with that bone scan so we can then go from there in terms of what other treatment recommendations we may have for his right knee pain.

## 2024-03-10 ENCOUNTER — Telehealth: Payer: Self-pay | Admitting: Orthopaedic Surgery

## 2024-03-11 ENCOUNTER — Ambulatory Visit (HOSPITAL_COMMUNITY)
Admission: RE | Admit: 2024-03-11 | Discharge: 2024-03-11 | Disposition: A | Discharge status: Home / Self Care | Source: Ambulatory Visit | Attending: Orthopaedic Surgery | Admitting: Orthopaedic Surgery

## 2024-03-11 ENCOUNTER — Encounter: Payer: Self-pay | Admitting: Cardiology

## 2024-03-11 ENCOUNTER — Ambulatory Visit (HOSPITAL_COMMUNITY)
Admission: RE | Admit: 2024-03-11 | Discharge: 2024-03-11 | Attending: Orthopaedic Surgery | Admitting: Orthopaedic Surgery

## 2024-03-11 ENCOUNTER — Ambulatory Visit: Admitting: Cardiology

## 2024-03-11 VITALS — BP 128/60 | HR 93 | Ht 63.0 in | Wt 216.0 lb

## 2024-03-11 DIAGNOSIS — E1169 Type 2 diabetes mellitus with other specified complication: Secondary | ICD-10-CM | POA: Insufficient documentation

## 2024-03-11 DIAGNOSIS — E785 Hyperlipidemia, unspecified: Secondary | ICD-10-CM

## 2024-03-11 DIAGNOSIS — Z01810 Encounter for preprocedural cardiovascular examination: Secondary | ICD-10-CM | POA: Insufficient documentation

## 2024-03-11 DIAGNOSIS — I10 Essential (primary) hypertension: Secondary | ICD-10-CM

## 2024-03-11 DIAGNOSIS — R931 Abnormal findings on diagnostic imaging of heart and coronary circulation: Secondary | ICD-10-CM | POA: Insufficient documentation

## 2024-03-11 DIAGNOSIS — R011 Cardiac murmur, unspecified: Secondary | ICD-10-CM | POA: Diagnosis present

## 2024-03-11 DIAGNOSIS — Z96651 Presence of right artificial knee joint: Secondary | ICD-10-CM | POA: Diagnosis present

## 2024-03-11 HISTORY — DX: Abnormal findings on diagnostic imaging of heart and coronary circulation: R93.1

## 2024-03-11 MED ORDER — TECHNETIUM TC 99M MEDRONATE IV KIT
20.0000 | PACK | Freq: Once | INTRAVENOUS | Status: AC | PRN
  Administered 2024-03-11: 21.4 via INTRAVENOUS

## 2024-03-11 MED ORDER — ROSUVASTATIN CALCIUM 20 MG PO TABS: 20.0000 mg | ORAL_TABLET | Freq: Every day | ORAL | 3 refills | Status: AC

## 2024-03-11 NOTE — Progress Notes (Signed)
 Cardiology Office Note:  .   Date:  03/15/2024  ID:  [name], DOB 14-Dec-1955, MRN [id number] PCP: Sophronia Ozell BROCKS, MD  [facility] Providers Cardiologist:  Alm Clay, MD     Chief Complaint  Patient presents with   New Patient (Initial Visit)    The patient evaluation for elevated coronary calcium  score.  Also needs preop assessment    Patient Profile: .     [name] is a morbidly obese 68 y.o. male former smoker (quit in 2000) with a PMH notable for HTN, GERD, HLD who presents here for evaluation of coronary artery disease involving native coronary artery without angina pectoris at the request of Sophronia Ozell BROCKS, MD.    Ozell FORBES Palin was last seen on he was seen on 02/05/2024 by PCPs office for routine annual follow-up.  Coronary Calcium  Score ordered along with AAA ultrasound.  Based on a ASCVD risk score estimated at 32.3.  Subjective  Discussed the use of AI scribe software for clinical note transcription with the patient, who gave verbal consent to proceed.  History of Present Illness [name] is a 68 year old male with diabetes and coronary artery disease who presents for a cardiology consultation.  He has a coronary calcium  score of 433, with the patient recalling that it was mostly in the LAD and right coronary artery. Despite this, he is asymptomatic with no chest pain, pressure, or shortness of breath during physical activity. He describes himself as 'pretty active', working in [employer] and walking eight hours a day.  He was recently diagnosed with diabetes, with a hemoglobin A1c increasing from 6.1% to 8.0% over the past two years. He is currently taking metformin 1500 mg daily, which causes gastrointestinal discomfort. His fasting glucose level was 155 mg/dL.  He has been started on rosuvastatin  for cholesterol management, but has only been taking it for two weeks. He reports no muscle aches or cramping associated with  the medication. His cholesterol levels were previously borderline, with a total cholesterol of 210 mg/dL and LDL of 858 mg/dL.  He mentions a history of a right knee issue, with a bone scan performed due to concerns about a loose joint from a previous artificial knee replacement. He is uncertain about the need for further surgery.  He underwent a sleep study and was informed of having sleep apnea, but has not yet received a CPAP machine. He also mentions a history of glaucoma and cataracts, with upcoming eye surgery.  No chest pain, pressure, or tightness, shortness of breath, dizziness, or lightheadedness during activities. He reports occasional dizziness when standing up quickly, which he attributes to a medication. No blood in stools, epistaxis, or muscle cramps. He mentions a 'little heart murmur' noted during a sleep study, but is unsure of its significance.     Objective   PMH: HTN, hyperglycemia, HLD, GERD, severe obesity, DM-2, OA (notably right knee)  Current Meds  Medication Sig   aspirin  81 MG chewable tablet Chew 81 mg by mouth daily.   diclofenac  (VOLTAREN ) 75 MG EC tablet Take 75 mg by mouth 2 (two) times daily.   fexofenadine (ALLEGRA) 180 MG tablet Take 180 mg by mouth daily.   meclizine (ANTIVERT) 25 MG tablet Take 25 mg by mouth 3 (three) times daily as needed for dizziness.   meloxicam  (MOBIC ) 7.5 MG tablet Take 1 tablet (7.5 mg total) by mouth daily.   metFORMIN (GLUCOPHAGE-XR) 500 MG 24 hr tablet Take  500 mg by mouth daily with breakfast. Pt takes 3 tablets 1,500 mg dose by mouth with supper.   metoprolol  succinate (TOPROL -XL) 50 MG 24 hr tablet Take 50 mg by mouth daily.   omeprazole (PRILOSEC) 20 MG capsule Take 20 mg by mouth at bedtime.   ondansetron  (ZOFRAN ) 4 MG tablet Take 1 tablet (4 mg total) by mouth every 6 (six) hours as needed for nausea.   rosuvastatin  (CRESTOR ) 10 MG tablet Take 10 mg by mouth at bedtime.   timolol  (TIMOPTIC ) 0.5 % ophthalmic solution  Place 1 drop into both eyes at bedtime.   Travoprost , BAK Free, (TRAVATAN ) 0.004 % SOLN ophthalmic solution Place 1 drop into both eyes at bedtime.   Pertinent Cardiac Medications: Aspirin  81 mg daily, metoprolol  succinate 50 mg daily, rosuvastatin  10 mg daily; metformin 500 mg 3 tabs daily at dinner.    Social History: Quit smoking in 2000.  Occasional alcohol.  Working on diet etc. lose weight.  Tries to exercise.  Nocturia 3-4 times a night.  Family history only notable for bladder cancer and his mother and prostate cancer in his father.  Studies Reviewed: SABRA   EKG Interpretation Date/Time:  Wednesday March 11 2024 13:50:51 EST Ventricular Rate:  93 PR Interval:  158 QRS Duration:  68 QT Interval:  380 QTC Calculation: 472 R Axis:   -27  Text Interpretation: Normal sinus rhythm Cannot rule out Anterior infarct , age undetermined When compared with ECG of 26-Feb-2022 13:28, No significant change since last tracing Confirmed by Anner Lenis ([id number]) on 03/11/2024 2:00:13 PM     Results Labs And Studies from [facility] Lipid Panel Component 02/05/24 10/11/21 10/11/21 09/12/20 09/24/18 09/17/16  Cholesterol, Total 210 High  235 High  -- 196 241 High  201 High   Triglycerides 150 High  155 High  -- 225 High  259 High  219 High   HDL 42 50 -- 35 Low  47 45  VLDL Cholesterol Cal 27 28 -- 40 52 High  44 High   LDL 141 High  157 High  --     01/27/2024: BUN/Cr 13/0.82; LFTs & Lytes normall; glucose 155, sodium 138, potassium 4.3; Hgb 16.4, PLT 278 Component 02/05/24 10/11/21 09/12/20  Hemoglobin A1c 8.0 High   6.1 High   6.4 High     RADIOLOGY Coronary Calcium  Score: 433.  LAD 186, LCx 66, RCA 164.  Moderate aortic valve and thoracic aortic calcification as well as mild MAC (03/02/2024) Abdominal aortic scan: Normal, cholelithiasis noted  DIAGNOSTIC Carotid Doppler: Less than 40% stenosis bilaterally, vertebral arteries normal (September 2023)   Risk Assessment/Calculations:          Physical Exam:   VS:  BP 128/60 (BP Location: Right Arm, Patient Position: Sitting, Cuff Size: Large)   Pulse 93   Ht 5' 3 (1.6 m)   Wt 216 lb (98 kg)   SpO2 95%   BMI 38.26 kg/m    Wt Readings from Last 3 Encounters:  03/11/24 216 lb (98 kg)  03/16/22 216 lb (98 kg)  02/26/22 217 lb (98.4 kg)     GEN: Well nourished, well groomed; in no acute distress; moderate severely obese NECK: No JVD; No carotid bruits CARDIAC: RRR, Normal S1, S2; soft 1/6 SEM at RUSB but otherwise no additional murmurs, rubs, gallops RESPIRATORY:  Clear to auscultation without rales, wheezing or rhonchi ; nonlabored, good air movement. ABDOMEN: Soft, non-tender, non-distended EXTREMITIES:  No edema; No deformity      ASSESSMENT  AND PLAN: .    Problem List Items Addressed This Visit       Cardiology Problems   Elevated coronary artery calcium  score = 433 (Chronic)   Coronary calcium  score of 433 indicates significant plaque in LAD and right coronary artery. No symptoms or significant blockages noted. Risk factors include diabetes, hypertension, and hyperlipidemia. - Order echocardiogram to assess heart function and valve status. - Increase rosuvastatin  dose to 20 mg daily targeting LDL < 70 if possible. - Resume aspirin  therapy post-eye surgery. - Monitor for symptoms such as chest pain or shortness of breath => would have low threshold for ischemic evaluation.      Relevant Medications   rosuvastatin  (CRESTOR ) 20 MG tablet   Other Relevant Orders   ECHOCARDIOGRAM COMPLETE (Completed)   Essential hypertension - Primary (Chronic)   Blood pressure well-controlled on current regimen. - Continue current blood pressure management.:  Metoprolol  succinate 50 mg daily      Relevant Medications   rosuvastatin  (CRESTOR ) 20 MG tablet   Other Relevant Orders   EKG 12-Lead (Completed)   Hyperlipidemia associated with type 2 diabetes mellitus (HCC) (Chronic)   Total cholesterol 210 mg/dL, LDL  858 mg/dL. Current treatment with rosuvastatin  10 mg daily. Plan to increase dose for better control, especially with diabetes. - Increased rosuvastatin  dose to 20 mg daily. - Will recheck cholesterol levels after one month of increased dose.  Hemoglobin A1c increased from 6.1 to 8.0. Fasting glucose 155 mg/dL. Metformin 1500 mg daily causing gastrointestinal side effects.  Discussed alternative heart-protective diabetes medications. - Discuss alternative diabetes medications with primary care provider due to metformin side effects. - Consider Rybelsus, Jardiance, or Farxiga as alternative treatments.      Relevant Medications   rosuvastatin  (CRESTOR ) 20 MG tablet     Other   Heart murmur   Relatively soft systolic murmur on exam probably consistent with aortic valve sclerosis, but need to exclude initial signs of stenosis.  - Check 2D echo      Preop cardiovascular exam   Considering the possibility of knee surgery but also has upcoming eye surgery.  Both procedures are low risk.  In the absence of any active cardiac symptoms of angina or heart failure, I would not recommend any further ischemic evaluation at this point.  We are getting an echocardiogram simply to assess the valve improved.  Provide if this looks relatively normal, would be fine to proceed with any surgeries or procedures without further evaluation.      Severe obesity (BMI 35.0-39.9) with comorbidity (HCC) (Chronic)   Contributing to diabetes and sleep apnea. Discussed weight loss for health improvement. - Encouraged weight loss through diet and exercise. - Discussed potential use of weight loss medications like Rybelsus.      Other Visit Diagnoses       Murmur       Relevant Orders   ECHOCARDIOGRAM COMPLETE (Completed)           Follow-Up: Return in about 6 months (around 09/09/2024) for 6 month follow-up with me, [employer].  I spent 49 minutes in the care of SULLIVAN JACUINDE today  including reviewing outside labs from [facility] and [facility] (2 minutes), reviewing studies (carotid Dopplers from 2023 reviewed-2 minutes), reviewing outside studies (Coronary CTA, and AAA Doppler-4 minutes), face to face time discussing treatment options (28 minutes), reviewing records from PCPs note (4 minutes), 9 minutes dictating, and documenting in the encounter.      Signed, Alm ORN.  Anner, MD, MS Alm Anner, M.D., M.S. Interventional Cardiologist  [facility] Pager # [phone]

## 2024-03-11 NOTE — Patient Instructions (Addendum)
 Medication Instructions:    Increase  20 mg Rosuvastatin  daily  *If you need a refill on your cardiac medications before your next appointment, please call your pharmacy*   Lab Work: Not needed If you have labs (blood work) drawn today and your tests are completely normal, you will receive your results only by: MyChart Message (if you have MyChart) OR A paper copy in the mail If you have any lab test that is abnormal or we need to change your treatment, we will call you to review the results.   Testing/Procedures:if echo okay you can have knee surgery if needed  Your physician has requested that you have an echocardiogram. Echocardiography is a painless test that uses sound waves to create images of your heart. It provides your doctor with information about the size and shape of your heart and how well your heart's chambers and valves are working. This procedure takes approximately one hour. There are no restrictions for this procedure. Please do NOT wear cologne, perfume, aftershave, or lotions (deodorant is allowed). Please arrive 15 minutes prior to your appointment time.  Please note: We ask at that you not bring children with you during ultrasound (echo/ vascular) testing. Due to room size and safety concerns, children are not allowed in the ultrasound rooms during exams. Our front office staff cannot provide observation of children in our lobby area while testing is being conducted. An adult accompanying a patient to their appointment will only be allowed in the ultrasound room at the discretion of the ultrasound technician under special circumstances. We apologize for any inconvenience.   Follow-Up: At [facility], you and your health needs are our priority.  As part of our continuing mission to provide you with exceptional heart care, we have created designated Provider Care Teams.  These Care Teams include your primary Cardiologist (physician) and Advanced Practice Providers  (APPs -  Physician Assistants and Nurse Practitioners) who all work together to provide you with the care you need, when you need it.     Your next appointment:    5 to 6 month(s)  The format for your next appointment:   In Person  Provider:   Alm Clay, MD

## 2024-03-13 ENCOUNTER — Ambulatory Visit (HOSPITAL_COMMUNITY): Admission: RE | Admit: 2024-03-13 | Discharge: 2024-03-13 | Attending: Cardiology | Admitting: Cardiology

## 2024-03-13 DIAGNOSIS — R011 Cardiac murmur, unspecified: Secondary | ICD-10-CM

## 2024-03-13 DIAGNOSIS — R931 Abnormal findings on diagnostic imaging of heart and coronary circulation: Secondary | ICD-10-CM

## 2024-03-13 LAB — ECHOCARDIOGRAM COMPLETE
AR max vel: 1.94 cm2
AV Area VTI: 1.79 cm2
AV Area mean vel: 1.96 cm2
AV Mean grad: 8 mmHg
AV Peak grad: 14.9 mmHg
Ao pk vel: 1.93 m/s
Area-P 1/2: 3.12 cm2
Est EF: >75
S' Lateral: 2.2 cm

## 2024-03-13 MED ORDER — PERFLUTREN LIPID MICROSPHERE
1.0000 mL | INTRAVENOUS | Status: AC | PRN
  Administered 2024-03-13: 2 mL via INTRAVENOUS

## 2024-03-15 ENCOUNTER — Ambulatory Visit: Payer: Self-pay | Admitting: Cardiology

## 2024-03-15 ENCOUNTER — Encounter: Payer: Self-pay | Admitting: Cardiology

## 2024-03-15 DIAGNOSIS — R011 Cardiac murmur, unspecified: Secondary | ICD-10-CM | POA: Insufficient documentation

## 2024-03-15 DIAGNOSIS — Z01810 Encounter for preprocedural cardiovascular examination: Secondary | ICD-10-CM | POA: Insufficient documentation

## 2024-03-15 NOTE — Assessment & Plan Note (Signed)
 Blood pressure well-controlled on current regimen. - Continue current blood pressure management.:  Metoprolol  succinate 50 mg daily

## 2024-03-15 NOTE — Assessment & Plan Note (Signed)
 Considering the possibility of knee surgery but also has upcoming eye surgery.  Both procedures are low risk.  In the absence of any active cardiac symptoms of angina or heart failure, I would not recommend any further ischemic evaluation at this point.  We are getting an echocardiogram simply to assess the valve improved.  Provide if this looks relatively normal, would be fine to proceed with any surgeries or procedures without further evaluation.

## 2024-03-15 NOTE — Assessment & Plan Note (Signed)
 Coronary calcium  score of 433 indicates significant plaque in LAD and right coronary artery. No symptoms or significant blockages noted. Risk factors include diabetes, hypertension, and hyperlipidemia. - Order echocardiogram to assess heart function and valve status. - Increase rosuvastatin  dose to 20 mg daily targeting LDL < 70 if possible. - Resume aspirin  therapy post-eye surgery. - Monitor for symptoms such as chest pain or shortness of breath => would have low threshold for ischemic evaluation.

## 2024-03-15 NOTE — Assessment & Plan Note (Signed)
 Contributing to diabetes and sleep apnea. Discussed weight loss for health improvement. - Encouraged weight loss through diet and exercise. - Discussed potential use of weight loss medications like Rybelsus.

## 2024-03-15 NOTE — Assessment & Plan Note (Signed)
 Relatively soft systolic murmur on exam probably consistent with aortic valve sclerosis, but need to exclude initial signs of stenosis.  - Check 2D echo

## 2024-03-15 NOTE — Assessment & Plan Note (Signed)
 Total cholesterol 210 mg/dL, LDL 858 mg/dL. Current treatment with rosuvastatin  10 mg daily. Plan to increase dose for better control, especially with diabetes. - Increased rosuvastatin  dose to 20 mg daily. - Will recheck cholesterol levels after one month of increased dose.  Hemoglobin A1c increased from 6.1 to 8.0. Fasting glucose 155 mg/dL. Metformin 1500 mg daily causing gastrointestinal side effects.  Discussed alternative heart-protective diabetes medications. - Discuss alternative diabetes medications with primary care provider due to metformin side effects. - Consider Rybelsus, Jardiance, or Farxiga as alternative treatments.

## 2024-03-18 ENCOUNTER — Encounter: Payer: Self-pay | Admitting: Orthopaedic Surgery

## 2024-03-18 ENCOUNTER — Ambulatory Visit: Admitting: Orthopaedic Surgery

## 2024-03-18 DIAGNOSIS — M25561 Pain in right knee: Secondary | ICD-10-CM | POA: Diagnosis not present

## 2024-03-18 DIAGNOSIS — T84032A Mechanical loosening of internal right knee prosthetic joint, initial encounter: Secondary | ICD-10-CM | POA: Insufficient documentation

## 2024-03-18 DIAGNOSIS — Z96651 Presence of right artificial knee joint: Secondary | ICD-10-CM | POA: Diagnosis not present

## 2024-03-18 DIAGNOSIS — T84032D Mechanical loosening of internal right knee prosthetic joint, subsequent encounter: Secondary | ICD-10-CM | POA: Diagnosis not present

## 2024-03-18 NOTE — Progress Notes (Signed)
 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  This knee was replaced around 6 or 7 years ago by Dr. Barbarann.  He had recently had acute pain and swelling.  A white blood cell count was about 3000 from the knee when fluid was aspirated from his knee elsewhere.  There was no organisms or evidence of infection.  There is still no redness.  He said the pain is subsiding but the knee still swells.  He does work standing in a kitchen.  On exam there is slight warmth of the right knee today but no redness and he is able to flex and extend the knee but it is swollen.  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  At this point we have recommended a right total knee revision arthroplasty given the clinical exam and imaging study findings.  He understands this still may represent is an infection once we obtain soft tissue cultures.  Given his swelling combined with the evidence of loosening of the prosthetic components, we recommended revision of all components.  I did show him a knee replacement model and pictures of knee revision and discussed what the surgery involves in length and detail.  I discussed what to expect from an intraoperative and postoperative standpoint and discussed all scenarios including the worst-case scenario of finding infection and needing to keep all components out with a temporary antibiotic spacer.  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  All questions and concerns were answered addressed.  We will work on getting him scheduled in the near future.

## 2024-03-26 ENCOUNTER — Telehealth: Payer: Self-pay

## 2024-03-26 NOTE — Telephone Encounter (Signed)
 I Called patient to discuss scheduling TKA revision.  He stated he wants to hold off on scheduling for now.  He will call back when he is ready to schedule.

## 2024-04-30 ENCOUNTER — Other Ambulatory Visit: Payer: Self-pay

## 2024-05-01 ENCOUNTER — Other Ambulatory Visit: Payer: Self-pay
